# Patient Record
Sex: Female | Born: 1970 | Race: Black or African American | Hispanic: No | Marital: Married | Smoking: Never smoker
Health system: Southern US, Community
[De-identification: ages and names within clinical notes are randomized; demographics above are authoritative.]

## PROBLEM LIST (undated history)

## (undated) DIAGNOSIS — K219 Gastro-esophageal reflux disease without esophagitis: Secondary | ICD-10-CM

## (undated) DIAGNOSIS — J45909 Unspecified asthma, uncomplicated: Secondary | ICD-10-CM

## (undated) DIAGNOSIS — F419 Anxiety disorder, unspecified: Secondary | ICD-10-CM

## (undated) DIAGNOSIS — G4730 Sleep apnea, unspecified: Secondary | ICD-10-CM

## (undated) DIAGNOSIS — E669 Obesity, unspecified: Secondary | ICD-10-CM

## (undated) DIAGNOSIS — E119 Type 2 diabetes mellitus without complications: Secondary | ICD-10-CM

## (undated) DIAGNOSIS — I10 Essential (primary) hypertension: Secondary | ICD-10-CM

## (undated) DIAGNOSIS — M1990 Unspecified osteoarthritis, unspecified site: Secondary | ICD-10-CM

## (undated) HISTORY — PX: CHOLECYSTECTOMY: SHX55

## (undated) HISTORY — DX: Essential (primary) hypertension: I10

## (undated) HISTORY — DX: Anxiety disorder, unspecified: F41.9

## (undated) HISTORY — DX: Unspecified osteoarthritis, unspecified site: M19.90

## (undated) HISTORY — PX: ABDOMINAL HYSTERECTOMY: SHX81

## (undated) HISTORY — DX: Obesity, unspecified: E66.9

## (undated) HISTORY — DX: Sleep apnea, unspecified: G47.30

## (undated) HISTORY — DX: Type 2 diabetes mellitus without complications: E11.9

## (undated) HISTORY — PX: LAPAROSCOPIC GASTRIC BANDING: SHX1100

---

## 2004-04-07 ENCOUNTER — Encounter: Admission: RE | Admit: 2004-04-07 | Discharge: 2004-06-03 | Payer: Self-pay | Admitting: Family Medicine

## 2004-08-20 ENCOUNTER — Inpatient Hospital Stay (HOSPITAL_COMMUNITY): Admission: AD | Admit: 2004-08-20 | Discharge: 2004-08-20 | Payer: Self-pay | Admitting: Obstetrics and Gynecology

## 2004-09-26 ENCOUNTER — Ambulatory Visit (HOSPITAL_COMMUNITY): Admission: RE | Admit: 2004-09-26 | Discharge: 2004-09-26 | Payer: Self-pay | Admitting: Obstetrics & Gynecology

## 2004-11-07 ENCOUNTER — Ambulatory Visit (HOSPITAL_COMMUNITY): Admission: RE | Admit: 2004-11-07 | Discharge: 2004-11-07 | Payer: Self-pay | Admitting: Obstetrics & Gynecology

## 2006-07-23 ENCOUNTER — Emergency Department (HOSPITAL_COMMUNITY): Admission: EM | Admit: 2006-07-23 | Discharge: 2006-07-23 | Payer: Self-pay | Admitting: Emergency Medicine

## 2007-04-11 ENCOUNTER — Ambulatory Visit (HOSPITAL_COMMUNITY): Admission: RE | Admit: 2007-04-11 | Discharge: 2007-04-11 | Payer: Self-pay | Admitting: General Surgery

## 2007-04-21 ENCOUNTER — Encounter: Admission: RE | Admit: 2007-04-21 | Discharge: 2007-04-21 | Payer: Self-pay | Admitting: General Surgery

## 2009-06-17 ENCOUNTER — Ambulatory Visit (HOSPITAL_BASED_OUTPATIENT_CLINIC_OR_DEPARTMENT_OTHER): Admission: RE | Admit: 2009-06-17 | Discharge: 2009-06-17 | Payer: Self-pay | Admitting: Family Medicine

## 2009-06-17 ENCOUNTER — Ambulatory Visit: Payer: Self-pay | Admitting: Diagnostic Radiology

## 2010-06-17 ENCOUNTER — Other Ambulatory Visit (HOSPITAL_BASED_OUTPATIENT_CLINIC_OR_DEPARTMENT_OTHER): Payer: Self-pay | Admitting: Family Medicine

## 2010-06-17 DIAGNOSIS — Z1231 Encounter for screening mammogram for malignant neoplasm of breast: Secondary | ICD-10-CM

## 2010-06-25 ENCOUNTER — Inpatient Hospital Stay (HOSPITAL_BASED_OUTPATIENT_CLINIC_OR_DEPARTMENT_OTHER): Admission: RE | Admit: 2010-06-25 | Payer: Self-pay | Source: Ambulatory Visit

## 2010-06-26 ENCOUNTER — Other Ambulatory Visit (HOSPITAL_BASED_OUTPATIENT_CLINIC_OR_DEPARTMENT_OTHER): Payer: Self-pay | Admitting: Family Medicine

## 2010-06-26 DIAGNOSIS — Z1231 Encounter for screening mammogram for malignant neoplasm of breast: Secondary | ICD-10-CM

## 2010-07-01 ENCOUNTER — Other Ambulatory Visit (HOSPITAL_BASED_OUTPATIENT_CLINIC_OR_DEPARTMENT_OTHER): Payer: Self-pay | Admitting: Family Medicine

## 2010-07-01 ENCOUNTER — Ambulatory Visit (HOSPITAL_BASED_OUTPATIENT_CLINIC_OR_DEPARTMENT_OTHER)
Admission: RE | Admit: 2010-07-01 | Discharge: 2010-07-01 | Disposition: A | Discharge status: Home / Self Care | Payer: BC Managed Care – PPO | Source: Ambulatory Visit | Attending: Family Medicine | Admitting: Family Medicine

## 2010-07-01 ENCOUNTER — Ambulatory Visit (INDEPENDENT_AMBULATORY_CARE_PROVIDER_SITE_OTHER)
Admission: RE | Admit: 2010-07-01 | Discharge: 2010-07-01 | Disposition: A | Discharge status: Home / Self Care | Payer: BC Managed Care – PPO | Source: Ambulatory Visit | Attending: Family Medicine | Admitting: Family Medicine

## 2010-07-01 DIAGNOSIS — Z1231 Encounter for screening mammogram for malignant neoplasm of breast: Secondary | ICD-10-CM

## 2010-08-08 NOTE — Op Note (Signed)
NAMEDARAH, SIMKIN               ACCOUNT NO.:  [id number]   MEDICAL RECORD NO.:  [id number]          PATIENT TYPE:  AMB   LOCATION:  SDC                           FACILITY:  [facility]   PHYSICIAN:  Roseanna Rainbow, M.D.DATE OF BIRTH:  05-29-1970   DATE OF PROCEDURE:  11/07/2004  DATE OF DISCHARGE:                                 OPERATIVE REPORT   PREOPERATIVE DIAGNOSES:  Cervical stenosis, menorrhagia, dysmenorrhea.   POSTOPERATIVE DIAGNOSES:  Cervical stenosis, menorrhagia, dysmenorrhea.   PROCEDURE:  Cervical dilatation and Mirena IUD insertion.   SURGEON:  Antionette Char, M.D.   ANESTHESIA:  Managed anesthesia care, paracervical block.   COMPLICATIONS:  None.   ESTIMATED BLOOD LOSS:  Minimal.   DESCRIPTION OF PROCEDURE:  The patient was taken to the operating room. She  was placed in the dorsal lithotomy position and prepped and draped in the  usual sterile fashion. A Graves speculum was then placed in the patient's  vagina. The anterior lip of the cervix was infiltrated with 2 mL of  lidocaine. A single-tooth tenaculum was applied to this location. 4 mL were  injected at 5 and 7 o'clock to produce a paracervical block. The cervix was  then dilated initially with lacrimal duct dilators and subsequently with  Hegar dilators. Using ultrasound guidance, the uterus was sounded gently to  9 cm. Again using ultrasound guidance, the Mirena IUD was then inserted and  the string trimmed. The tenaculum was removed with minimal bleeding noted.  The patient tolerated the procedures well. The patient was taken to the PACU  awake and in stable condition.      Roseanna Rainbow, M.D.  Electronically Signed     LAJ/MEDQ  D:  11/07/2004  T:  11/07/2004  Job:  [id number]

## 2015-03-24 HISTORY — PX: SUPRACERVICAL ABDOMINAL HYSTERECTOMY: SHX5393

## 2019-12-01 ENCOUNTER — Emergency Department (HOSPITAL_BASED_OUTPATIENT_CLINIC_OR_DEPARTMENT_OTHER): Payer: 59

## 2019-12-01 ENCOUNTER — Encounter: Payer: Self-pay | Admitting: Gastroenterology

## 2019-12-01 ENCOUNTER — Emergency Department (HOSPITAL_BASED_OUTPATIENT_CLINIC_OR_DEPARTMENT_OTHER)
Admission: EM | Admit: 2019-12-01 | Discharge: 2019-12-01 | Disposition: A | Discharge status: Home / Self Care | Payer: 59 | Attending: Emergency Medicine | Admitting: Emergency Medicine

## 2019-12-01 ENCOUNTER — Other Ambulatory Visit: Payer: Self-pay

## 2019-12-01 ENCOUNTER — Encounter (HOSPITAL_BASED_OUTPATIENT_CLINIC_OR_DEPARTMENT_OTHER): Payer: Self-pay | Admitting: Emergency Medicine

## 2019-12-01 ENCOUNTER — Ambulatory Visit: Payer: 59 | Admitting: Gastroenterology

## 2019-12-01 VITALS — BP 114/60 | HR 92 | Ht 64.0 in | Wt 276.6 lb

## 2019-12-01 DIAGNOSIS — K219 Gastro-esophageal reflux disease without esophagitis: Secondary | ICD-10-CM | POA: Diagnosis not present

## 2019-12-01 DIAGNOSIS — Z9884 Bariatric surgery status: Secondary | ICD-10-CM | POA: Diagnosis not present

## 2019-12-01 DIAGNOSIS — J45909 Unspecified asthma, uncomplicated: Secondary | ICD-10-CM | POA: Diagnosis not present

## 2019-12-01 DIAGNOSIS — K2100 Gastro-esophageal reflux disease with esophagitis, without bleeding: Secondary | ICD-10-CM | POA: Insufficient documentation

## 2019-12-01 DIAGNOSIS — Z20822 Contact with and (suspected) exposure to covid-19: Secondary | ICD-10-CM | POA: Insufficient documentation

## 2019-12-01 DIAGNOSIS — Z7951 Long term (current) use of inhaled steroids: Secondary | ICD-10-CM | POA: Diagnosis not present

## 2019-12-01 DIAGNOSIS — Z91010 Allergy to peanuts: Secondary | ICD-10-CM | POA: Diagnosis not present

## 2019-12-01 DIAGNOSIS — J690 Pneumonitis due to inhalation of food and vomit: Secondary | ICD-10-CM

## 2019-12-01 DIAGNOSIS — R1110 Vomiting, unspecified: Secondary | ICD-10-CM | POA: Diagnosis present

## 2019-12-01 HISTORY — DX: Gastro-esophageal reflux disease without esophagitis: K21.9

## 2019-12-01 HISTORY — DX: Unspecified asthma, uncomplicated: J45.909

## 2019-12-01 LAB — SARS CORONAVIRUS 2 BY RT PCR (HOSPITAL ORDER, PERFORMED IN ~~LOC~~ HOSPITAL LAB): SARS Coronavirus 2: NEGATIVE

## 2019-12-01 MED ORDER — FLUCONAZOLE 150 MG PO TABS: ORAL_TABLET | ORAL | 0 refills | Status: DC

## 2019-12-01 MED ORDER — AMOXICILLIN 500 MG PO CAPS
1000.0000 mg | ORAL_CAPSULE | Freq: Once | ORAL | Status: AC
  Administered 2019-12-01: 1000 mg via ORAL
  Filled 2019-12-01: qty 2

## 2019-12-01 MED ORDER — FAMOTIDINE 20 MG PO TABS: 20.0000 mg | ORAL_TABLET | Freq: Two times a day (BID) | ORAL | 2 refills | Status: DC

## 2019-12-01 MED ORDER — FAMOTIDINE 20 MG PO TABS
40.0000 mg | ORAL_TABLET | Freq: Once | ORAL | Status: AC
  Administered 2019-12-01: 40 mg via ORAL
  Filled 2019-12-01: qty 2

## 2019-12-01 MED ORDER — AMOXICILLIN 500 MG PO CAPS: 1000.0000 mg | ORAL_CAPSULE | Freq: Three times a day (TID) | ORAL | 0 refills | Status: DC

## 2019-12-01 MED ORDER — FAMOTIDINE 40 MG PO TABS: 20.0000 mg | ORAL_TABLET | Freq: Two times a day (BID) | ORAL | 0 refills | Status: DC

## 2019-12-01 MED ORDER — OMEPRAZOLE 2 MG/ML ORAL SUSPENSION: 40.0000 mg | Freq: Two times a day (BID) | ORAL | 2 refills | Status: DC

## 2019-12-01 NOTE — ED Provider Notes (Addendum)
[facility] Provider Note: Veronica Dell, MD, FACEP  CSN: [id number] MRN: [id number] ARRIVAL: 12/01/19 at 0414 ROOM: MH11/MH11   CHIEF COMPLAINT  Vomiting and Aspiration   HISTORY OF PRESENT ILLNESS  12/01/19 4:43 AM Veronica [name] is a 49 y.o. female with a longstanding history of acid reflux status post lap banding 7 years ago.  She believes the LAP-BAND is slipped from its ideal position.  Her acid reflux has become severe over the past week and occurs when she lies flat or even sits up on several pillows at night.  She describes her reflux as bilious and acidic and causes her to cough.  She is concerned she may have aspirated and got pneumonia.  She has a GI appointment later today but wanted to be evaluated for pneumonia.  She has only having mild shortness of breath.  She had a subjective fever at home for which she took Tylenol.   Past Medical History:  Diagnosis Date  . Asthma   . GERD (gastroesophageal reflux disease)     Past Surgical History:  Procedure Laterality Date  . ABDOMINAL HYSTERECTOMY    . CHOLECYSTECTOMY    . LAPAROSCOPIC GASTRIC BANDING      No family history on file.  Social History   Tobacco Use  . Smoking status: Never Smoker  . Smokeless tobacco: Never Used  Substance Use Topics  . Alcohol use: Not Currently  . Drug use: Never    Prior to Admission medications   Medication Sig Start Date End Date Taking? Authorizing Provider  fluticasone (FLONASE) 50 MCG/ACT nasal spray Place into both nostrils daily.   Yes [provider]  fluticasone-salmeterol (ADVAIR HFA) 115-21 MCG/ACT inhaler Inhale 2 puffs into the lungs 2 (two) times daily.   Yes [provider]  montelukast (SINGULAIR) 10 MG tablet Take 10 mg by mouth at bedtime.   Yes [provider]  pantoprazole (PROTONIX) 40 MG tablet Take 40 mg by mouth daily.   Yes [provider]  amoxicillin (AMOXIL) 500 MG capsule Take 2 capsules (1,000 mg  total) by mouth 3 (three) times daily. 12/01/19   Aarav Burgett, MD  fluconazole (DIFLUCAN) 150 MG tablet Take 1 tablet as needed for vaginal yeast infection.  May repeat in 3 days if symptoms persist. 12/01/19   Wyman Meschke, MD    Allergies Peanut-containing drug products, Percocet [oxycodone-acetaminophen], and Shellfish allergy   REVIEW OF SYSTEMS  Negative except as noted here or in the History of Present Illness.   PHYSICAL EXAMINATION  Initial Vital Signs Blood pressure (!) 144/98, pulse 100, temperature 99.2 F (37.3 C), temperature source Oral, resp. rate 18, last menstrual period 06/15/2010, SpO2 96 %.   Examination General: Well-developed, well-nourished female in no acute distress; appearance consistent with age of record HENT: normocephalic; atraumatic Eyes: pupils equal, round and reactive to light; extraocular muscles intact Neck: supple Heart: regular rate and rhythm Lungs: clear to auscultation bilaterally Abdomen: soft; nondistended; nontender; bowel sounds present Extremities: No deformity; full range of motion; pulses normal Neurologic: Awake, alert and oriented; motor function intact in all extremities and symmetric; no facial droop Skin: Warm and dry Psychiatric: Normal mood and affect   RESULTS  Summary of this visit's results, reviewed and interpreted by myself:   EKG Interpretation  Date/Time:    Ventricular Rate:    PR Interval:    QRS Duration:   QT Interval:    QTC Calculation:   R Axis:     Text  Interpretation:        Laboratory Studies: Results for orders placed or performed during the hospital encounter of 12/01/19 (from the past 24 hour(s))  SARS Coronavirus 2 by RT PCR (hospital order, performed in [facility] hospital lab) Nasopharyngeal Nasopharyngeal Swab     Status: None   Collection Time: 12/01/19  5:25 AM   Specimen: Nasopharyngeal Swab  Result Value Ref Range   SARS Coronavirus 2 NEGATIVE NEGATIVE   Imaging Studies: DG Chest  2 View  Result Date: 12/01/2019 CLINICAL DATA:  Question aspiration.  Cough. EXAM: CHEST - 2 VIEW COMPARISON:  04/11/2007 FINDINGS: Streaky opacity at the right base correlating with the history. No edema, effusion, or pneumothorax. Normal heart size. There is a lap band in good position. Cholecystectomy clips. IMPRESSION: 1. Right lower lobe infiltrate. 2. Lap band in expected position. Electronically Signed   By: Marnee Spring M.D.   On: 12/01/2019 05:15    ED COURSE and MDM  Nursing notes, initial and subsequent vitals signs, including pulse oximetry, reviewed and interpreted by myself.  Vitals:   12/01/19 0443 12/01/19 0526  BP: (!) 144/98   Pulse: 100   Resp: 18   Temp: 99.2 F (37.3 C)   TempSrc: Oral   SpO2: 96%   Weight:  122.5 kg  Height:  5\' 5"  (1.651 m)   Medications  famotidine (PEPCID) tablet 40 mg (40 mg Oral Given 12/01/19 0504)  amoxicillin (AMOXIL) capsule 1,000 mg (1,000 mg Oral Given 12/01/19 0531)   5:25 AM We will add Pepcid to her pantoprazole regimen as the two may provide more relief than the pantoprazole alone.  We will start her on amoxicillin for her pneumonia.  Doxycycline might be a better choice except it can cause esophageal irritation which would be a contraindication in her case.  As noted above patient has an appointment with a gastroenterologist today.  She declines a prescription for an antiemetic because she does not believe they help her.  PROCEDURES  Procedures   ED DIAGNOSES     ICD-10-CM   1. Gastroesophageal reflux disease with esophagitis without hemorrhage  K21.00   2. Aspiration pneumonia of right lower lobe due to gastric secretions (HCC)  J69.0   3. COVID-19 virus not detected  Z03.818        Buddie Marston, 01/31/20, MD 12/01/19 01/31/20    [id number], MD 12/01/19 [phone]

## 2019-12-01 NOTE — Progress Notes (Addendum)
Referring Provider: No ref. provider found Primary Care Physician:  Patient, No Pcp Per  Reason for Consultation:  Severe reflux  IMPRESSION:  Severe reflux with brash and dysphonia Prior LAP-BAND with patient concerned that the band has slipped Small sliding hiatal hernia with mild gastroesophageal reflux seen on upper GI series 2009   Right lower lobe pneumonia on amoxicillin History of colon polyps     - patient understood she needed annual to every other year surveillance colonoscopy  Challenging situation with limited records.  Will work to obtain recent and prior records.  Unclear if ongoing symptoms are related to her LAP-BAND although this seems most likely. Will maximize medical therapy, proceed with UGI series to evaluate her anatomy, and consider EGD +/- Bravo in the future. I'd like for her to discuss her concerns about the slipped LAP-BAND with a bariatric surgeon.    PLAN: Obtain records from the [place] Switch pantoprazole to omeprazole elixir 40 mg BID Add famotidine 20 mg BID UGI series Reviewed lifestyle modifications for reflux Referral to bariatric surgery to discuss lap band removal Follow-up in the office in 4-8 weeks Referral to [facility]  Please see the "Patient Instructions" section for addition details about the plan.  HPI: Veronica [name] is a 49 y.o. female self-referred for severe reflux, vomiting, and reflux. The history is obtained through the patients. She does not have any records regarding her prior care and I do not have access to an electronic health record. She does not have any doctors locally.  She just moved to [place] 2 months ago after living in the [place] for several years.  She reports a history of anemia, asthma, arthritis in her feet and knees, gallstones, reflux, colon polyps, and obesity.  LAP-BAND 7 years ago in [place]. 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   Prior history of H pylori  diagnosed and treated while in [place] in 2019.   Notes a 6 month history of symptoms evaluated by a gastroenterologist and with her surgeon prior to leaving the [place]. Evaluation at that time included an UGI series and endoscopy. They discussed conversion to have the lap band removed with a gastric sleeve. They told her it would take $20,000. She was told that the esophagus was dilated because of the lap band.   Severe brash with associated dysphonia. No neck pain or sore throat. Exacerbated by lying flat. Has been sleeping on 6 pillows to try to minimize her symptoms.   When she eats she feels like the foods sits in her esophagus for hours before it passes.  Tried an alkaline diet, soft foods without any change in her symptoms.  She has lost 40 pounds because she can't eat.  She is following a very restrictive diet. She is on pantoprazole - which previously controled her symptoms.   The symptoms are so severe she doesn't feel like she is living. Seen in the ED this morning for these same symptoms. She is concerned that her lap band slipped. A chest x-ray showed a right lower lobe infiltrate and she was started on amoxicillin.  The LAP-BAND was in the expected position. The ED MD added Pepcid to her pantoprazole.   An upper GI series in 2009 obtained prior to her bariatric surgery showed a sliding hiatal hernia, a small amount of reflux, and otherwise normal-appearing motility.  She had a colonoscopy in 2019 in [place]. She had some polyps removed at that time. She  wasn't sure if she was supposed to have a colonoscopy every year or every other year.   She is very concerned about her risk for cancer with ongoing severe reflux.   Father with esophageal cancer at age 74. No known family history of colon cancer or polyps. No family history of uterine/endometrial cancer, pancreatic cancer or gastric/stomach cancer.   Past Medical History:  Diagnosis Date  . Asthma   . GERD (gastroesophageal reflux disease)      Past Surgical History:  Procedure Laterality Date  . ABDOMINAL HYSTERECTOMY    . CHOLECYSTECTOMY    . LAPAROSCOPIC GASTRIC BANDING      Current Outpatient Medications  Medication Sig Dispense Refill  . amoxicillin (AMOXIL) 500 MG capsule Take 2 capsules (1,000 mg total) by mouth 3 (three) times daily. 40 capsule 0  . fluconazole (DIFLUCAN) 150 MG tablet Take 1 tablet as needed for vaginal yeast infection.  May repeat in 3 days if symptoms persist. 2 tablet 0  . fluticasone (FLONASE) 50 MCG/ACT nasal spray Place into both nostrils daily.    . fluticasone-salmeterol (ADVAIR HFA) 115-21 MCG/ACT inhaler Inhale 2 puffs into the lungs 2 (two) times daily.    . montelukast (SINGULAIR) 10 MG tablet Take 10 mg by mouth at bedtime.    . famotidine (PEPCID) 20 MG tablet Take 1 tablet (20 mg total) by mouth 2 (two) times daily. 60 tablet 2  . omeprazole (PRILOSEC) 40 MG capsule Open 1 capsule and sprinkle in applesauce twice daily. 60 capsule 3   No current facility-administered medications for this visit.    Allergies as of 12/01/2019 - Review Complete 12/01/2019  Allergen Reaction Noted  . Peanut-containing drug products  12/01/2019  . Percocet [oxycodone-acetaminophen]  12/01/2019  . Shellfish allergy  12/01/2019    History reviewed. No pertinent family history.  Social History   Socioeconomic History  . Marital status: Married    Spouse name: Not on file  . Number of children: Not on file  . Years of education: Not on file  . Highest education level: Not on file  Occupational History  . Not on file  Tobacco Use  . Smoking status: Never Smoker  . Smokeless tobacco: Never Used  Substance and Sexual Activity  . Alcohol use: Not Currently  . Drug use: Never  . Sexual activity: Not on file  Other Topics Concern  . Not on file  Social History Narrative  . Not on file   Social Determinants of Health   Financial Resource Strain:   . Difficulty of Paying Living Expenses:  Not on file  Food Insecurity:   . Worried About Programme researcher, broadcasting/film/video in the Last Year: Not on file  . Ran Out of Food in the Last Year: Not on file  Transportation Needs:   . Lack of Transportation (Medical): Not on file  . Lack of Transportation (Non-Medical): Not on file  Physical Activity:   . Days of Exercise per Week: Not on file  . Minutes of Exercise per Session: Not on file  Stress:   . Feeling of Stress : Not on file  Social Connections:   . Frequency of Communication with Friends and Family: Not on file  . Frequency of Social Gatherings with Friends and Family: Not on file  . Attends Religious Services: Not on file  . Active Member of Clubs or Organizations: Not on file  . Attends Banker Meetings: Not on file  . Marital Status: Not on file  Intimate Partner Violence:   . Fear of Current or Ex-Partner: Not on file  . Emotionally Abused: Not on file  . Physically Abused: Not on file  . Sexually Abused: Not on file    Review of Systems: 12 system ROS is negative except as noted above with the additions of allergies, arthritis, cough, muscle pain, shortness of breath, insomnia, and dysphonia. Marland Kitchen   Physical Exam: General:   Alert,  well-nourished, pleasant and cooperative in NAD Head:  Normocephalic and atraumatic. Eyes:  Sclera clear, no icterus.   Conjunctiva pink. Ears:  Normal auditory acuity. Nose:  No deformity, discharge,  or lesions. Mouth:  No deformity or lesions.   Neck:  Supple; no masses or thyromegaly. Lungs:  Clear throughout to auscultation.   No wheezes. Heart:  Regular rate and rhythm; no murmurs. Abdomen:  Soft, central obesity, nontender, nondistended, normal bowel sounds, no rebound or guarding. No hepatosplenomegaly.   Rectal:  Deferred  Msk:  Symmetrical. No boney deformities LAD: No inguinal or umbilical LAD Extremities:  No clubbing or edema. Neurologic:  Alert and  oriented x4;  grossly nonfocal Skin:  Intact without significant  lesions or rashes. Psych:  Alert and cooperative. Normal mood and affect.     Studies/Results: No results found.    Delecia Vastine L. Orvan Falconer, MD, MPH 12/12/2019, 9:06 PM

## 2019-12-01 NOTE — Patient Instructions (Addendum)
You have been scheduled for an Upper GI Series at [facility] . Your appointment is on 12/07/19 at 9:00am. Please arrive 15 minutes prior to your test for registration. Make sure not to eat or drink anything after midnight on the night before your test. If you need to reschedule, please call radiology at [phone]. ________________________________________________________________ An upper GI series uses x rays to help diagnose problems of the upper GI tract, which includes the esophagus, stomach, and duodenum. The duodenum is the first part of the small intestine. An upper GI series is conducted by a radiology technologist or a radiologist--a doctor who specializes in x-ray imaging--at a hospital or outpatient center. While sitting or standing in front of an x-ray machine, the patient drinks barium liquid, which is often white and has a chalky consistency and taste. The barium liquid coats the lining of the upper GI tract and makes signs of disease show up more clearly on x rays. X-ray video, called fluoroscopy, is used to view the barium liquid moving through the esophagus, stomach, and duodenum. Additional x rays and fluoroscopy are performed while the patient lies on an x-ray table. To fully coat the upper GI tract with barium liquid, the technologist or radiologist may press on the abdomen or ask the patient to change position. Patients hold still in various positions, allowing the technologist or radiologist to take x rays of the upper GI tract at different angles. If a technologist conducts the upper GI series, a radiologist will later examine the images to look for problems.  This test typically takes about 1 hour to complete.  Stop Pantopazole.   Start Omerpazole Designer, jewellery).   We have sent the following medications to your pharmacy for you to pick up at your convenience: Omeprazole, Pepcid   A referral has been made to [facility] and [facility]. Please see their contact  information on your after visit summary. If you have not heard from their office in 1 week , please contact office.   Try to obtain your records from [place].   Thank you for choosing me and [facility].  Dr.Kimberly Beavers

## 2019-12-01 NOTE — ED Triage Notes (Signed)
Patient presents with complaints of severe acid reflux with vomiting; states she is waking up at night vomiting bile that is also coming out of her nose; states she feels like it has gotten into her lungs now and is having a cough now states sx onset 1 week ago; states has GI appointment today; states vomiting is only when lying down. States had lap band surgery approx 7 years ago.

## 2019-12-04 ENCOUNTER — Ambulatory Visit (HOSPITAL_COMMUNITY)
Admission: RE | Admit: 2019-12-04 | Discharge: 2019-12-04 | Disposition: A | Discharge status: Home / Self Care | Payer: 59 | Source: Ambulatory Visit | Attending: Gastroenterology | Admitting: Gastroenterology

## 2019-12-04 ENCOUNTER — Other Ambulatory Visit: Payer: Self-pay

## 2019-12-04 ENCOUNTER — Telehealth: Payer: Self-pay | Admitting: Gastroenterology

## 2019-12-04 DIAGNOSIS — K219 Gastro-esophageal reflux disease without esophagitis: Secondary | ICD-10-CM | POA: Insufficient documentation

## 2019-12-04 DIAGNOSIS — Z9884 Bariatric surgery status: Secondary | ICD-10-CM | POA: Insufficient documentation

## 2019-12-04 MED ORDER — OMEPRAZOLE 40 MG PO CPDR: DELAYED_RELEASE_CAPSULE | ORAL | 3 refills | Status: DC

## 2019-12-04 NOTE — Telephone Encounter (Signed)
Pt has been informed that rx for Omeprazole capsules have been sent to pharmacy with instructions for pt to open capsule and sprinkle in applesauce twice daily. Pt voiced understanding.

## 2019-12-07 ENCOUNTER — Ambulatory Visit (HOSPITAL_COMMUNITY): Payer: 59

## 2019-12-12 ENCOUNTER — Telehealth: Payer: Self-pay

## 2019-12-12 NOTE — Telephone Encounter (Signed)
Pepcid 20mg  has been approved through 12-10-2020. Approval note was faxed to pharmacy.

## 2019-12-23 ENCOUNTER — Other Ambulatory Visit: Payer: Self-pay | Admitting: Gastroenterology

## 2020-01-30 ENCOUNTER — Telehealth: Payer: Self-pay

## 2020-01-30 NOTE — Telephone Encounter (Signed)
In preparation for upcoming OV 02/06/20 with Dr. Orvan Falconer, I am unable to locate documentation pertaining to [facility] referral for possible lap band removal NOR records from [place]. Called pt to inquire further. LVM requesting returned call. Also called [facility] and spoke with traige nurse who confirmed pt was seen by Dr. Daphine Deutscher. My call was forwarded to Medical Records Bonita Quin). LVM requesting office notes be faxed to our office ATTENTION: Jakhai Fant.

## 2020-01-31 NOTE — Telephone Encounter (Signed)
Office notes from [facility] has been received and will provide to Dr. Orvan Falconer for her review. Patient scheduled for next OV 02/06/20 w/ Dr. Orvan Falconer.

## 2020-01-31 NOTE — Telephone Encounter (Signed)
Follow up re: [place] records:  Records cannot be located since records and services were provided outside the country.

## 2020-01-31 NOTE — Telephone Encounter (Signed)
Called [facility] to inquire again about office notes. Spoke with Bonita Quin. States she is faxing now. Will continue to await records.

## 2020-02-06 ENCOUNTER — Ambulatory Visit: Payer: 59 | Admitting: Gastroenterology

## 2020-02-12 ENCOUNTER — Other Ambulatory Visit: Payer: Self-pay

## 2020-02-13 ENCOUNTER — Ambulatory Visit: Payer: 59 | Admitting: Nurse Practitioner

## 2020-02-13 DIAGNOSIS — Z0289 Encounter for other administrative examinations: Secondary | ICD-10-CM

## 2020-02-25 ENCOUNTER — Other Ambulatory Visit: Payer: Self-pay

## 2020-02-25 ENCOUNTER — Encounter (HOSPITAL_COMMUNITY): Payer: Self-pay

## 2020-02-25 ENCOUNTER — Ambulatory Visit (HOSPITAL_COMMUNITY)
Admission: EM | Admit: 2020-02-25 | Discharge: 2020-02-25 | Disposition: A | Discharge status: Home / Self Care | Payer: 59 | Attending: Emergency Medicine | Admitting: Emergency Medicine

## 2020-02-25 DIAGNOSIS — R3589 Other polyuria: Secondary | ICD-10-CM | POA: Diagnosis not present

## 2020-02-25 DIAGNOSIS — R300 Dysuria: Secondary | ICD-10-CM | POA: Diagnosis not present

## 2020-02-25 LAB — POCT URINALYSIS DIPSTICK, ED / UC
Bilirubin Urine: NEGATIVE
Glucose, UA: NEGATIVE mg/dL
Ketones, ur: NEGATIVE mg/dL
Leukocytes,Ua: NEGATIVE
Nitrite: NEGATIVE
Protein, ur: NEGATIVE mg/dL
Specific Gravity, Urine: 1.025 (ref 1.005–1.030)
Urobilinogen, UA: 0.2 mg/dL (ref 0.0–1.0)
pH: 6 (ref 5.0–8.0)

## 2020-02-25 LAB — CBG MONITORING, ED: Glucose-Capillary: 126 mg/dL — ABNORMAL HIGH (ref 70–99)

## 2020-02-25 MED ORDER — MONTELUKAST SODIUM 10 MG PO TABS: 10.0000 mg | ORAL_TABLET | Freq: Every day | ORAL | 0 refills | Status: DC

## 2020-02-25 MED ORDER — DESLORATADINE 5 MG PO TABS: 5.0000 mg | ORAL_TABLET | Freq: Every day | ORAL | 0 refills | Status: DC

## 2020-02-25 NOTE — Discharge Instructions (Signed)
Your urine does not indicate infection today.  Your blood sugar, which is non-fasting, if borderline.  Please increase your water intake.  Monitor your sugar and carbohydrate intake. Increase fiber and protein intake.  Please follow up with your primary care provider as scheduled as you will likely need additional lab testing.

## 2020-02-25 NOTE — ED Triage Notes (Signed)
Pt c/o urinary frequency, urgency with intermittent burning with urination for approx 1 week.  Denies hematuria, abdominal/flank/back pain, fever, n/v/d.  Also requests refill of singulair.  Took tylenol arthritis this morning at 0900

## 2020-02-25 NOTE — ED Provider Notes (Signed)
[facility]    CSN: [id number] Arrival date & time: 02/25/20  1520      History   Chief Complaint Chief Complaint  Patient presents with  . Dysuria    HPI Veronica [name] is a 49 y.o. female.   Veronica [name] presents with complaints of urinary frequency for the past 2 weeks. Has been waking up at night even to go to the bathroom. No blood to urine. Only occasional burning with urination but this is rare. No pelvic or back pain. No blood to urine. Moved here from the [place] recently so has appointment in a little over a month to establish locally with a PCP. In need of refills for clarinex as well as singulair- history of asthma and allergies.     ROS per HPI, negative if not otherwise mentioned.      Past Medical History:  Diagnosis Date  . Asthma   . GERD (gastroesophageal reflux disease)     There are no problems to display for this patient.   Past Surgical History:  Procedure Laterality Date  . ABDOMINAL HYSTERECTOMY    . CHOLECYSTECTOMY    . LAPAROSCOPIC GASTRIC BANDING      OB History   No obstetric history on file.      Home Medications    Prior to Admission medications   Medication Sig Start Date End Date Taking? Authorizing Provider  famotidine (PEPCID) 20 MG tablet TAKE 1 TABLET BY MOUTH TWICE A DAY 12/25/19  Yes Tressia Danas, MD  fluticasone (FLONASE) 50 MCG/ACT nasal spray Place into both nostrils daily.   Yes [provider]  fluticasone-salmeterol (ADVAIR HFA) 115-21 MCG/ACT inhaler Inhale 2 puffs into the lungs 2 (two) times daily.   Yes [provider]  amoxicillin (AMOXIL) 500 MG capsule Take 2 capsules (1,000 mg total) by mouth 3 (three) times daily. 12/01/19   Molpus, John, MD  desloratadine (CLARINEX) 5 MG tablet Take 1 tablet (5 mg total) by mouth daily. 02/25/20   Georgetta Haber, NP  fluconazole (DIFLUCAN) 150 MG tablet Take 1 tablet as needed for vaginal yeast infection.  May repeat in 3 days if symptoms  persist. 12/01/19   Molpus, Jonny Ruiz, MD  montelukast (SINGULAIR) 10 MG tablet Take 1 tablet (10 mg total) by mouth at bedtime. 02/25/20   Georgetta Haber, NP  omeprazole (PRILOSEC) 40 MG capsule Open 1 capsule and sprinkle in applesauce twice daily. 12/04/19   Tressia Danas, MD    Family History History reviewed. No pertinent family history.  Social History Social History   Tobacco Use  . Smoking status: Never Smoker  . Smokeless tobacco: Never Used  Substance Use Topics  . Alcohol use: Not Currently  . Drug use: Never     Allergies   Peanut-containing drug products, Percocet [oxycodone-acetaminophen], and Shellfish allergy   Review of Systems Review of Systems   Physical Exam Triage Vital Signs ED Triage Vitals  Enc Vitals Group     BP 02/25/20 1631 (!) 154/96     Pulse Rate 02/25/20 1631 85     Resp 02/25/20 1631 20     Temp 02/25/20 1631 97.7 F (36.5 C)     Temp Source 02/25/20 1631 Oral     SpO2 02/25/20 1631 100 %     Weight --      Height --      Head Circumference --      Peak Flow --      Pain Score 02/25/20  1629 0     Pain Loc --      Pain Edu? --      Excl. in GC? --    No data found.  Updated Vital Signs BP (!) 154/96 (BP Location: Right Wrist)   Pulse 85   Temp 97.7 F (36.5 C) (Oral)   Resp 20   LMP 06/15/2010   SpO2 100%   Visual Acuity Right Eye Distance:   Left Eye Distance:   Bilateral Distance:    Right Eye Near:   Left Eye Near:    Bilateral Near:     Physical Exam Constitutional:      General: She is not in acute distress.    Appearance: She is well-developed.  Cardiovascular:     Rate and Rhythm: Normal rate.  Pulmonary:     Effort: Pulmonary effort is normal.  Abdominal:     Tenderness: There is no abdominal tenderness. There is no right CVA tenderness or left CVA tenderness.  Skin:    General: Skin is warm and dry.  Neurological:     Mental Status: She is alert and oriented to person, place, and time.      UC  Treatments / Results  Labs (all labs ordered are listed, but only abnormal results are displayed) Labs Reviewed  POCT URINALYSIS DIPSTICK, ED / UC - Abnormal; Notable for the following components:      Result Value   Hgb urine dipstick SMALL (*)    All other components within normal limits  CBG MONITORING, ED - Abnormal; Notable for the following components:   Glucose-Capillary 126 (*)    All other components within normal limits  URINE CULTURE    EKG   Radiology No results found.  Procedures Procedures (including critical care time)  Medications Ordered in UC Medications - No data to display  Initial Impression / Assessment and Plan / UC Course  I have reviewed the triage vital signs and the nursing notes.  Pertinent labs & imaging results that were available during my care of the patient were reviewed by me and considered in my medical decision making (see chart for details).     Urine without acute findings here today. Family histoyr of DM with blood sugar today of 126, non fasting. No glucose to urine dip. Discussed this at length with patient, she has a visit with PCP in one month and will follow up for hgb a1c. Diet and exercise encouraged as well in the meantime. Plenty of water encouraged. Return precautions provided. Patient verbalized understanding and agreeable to plan.  Ambulatory out of clinic without difficulty.    Patient had been getting deloratadine while living in [place], attempted to prescribe this as refill but will likely need to take an [place] antihistamine such as claritin or zyrtec.  Final Clinical Impressions(s) / UC Diagnoses   Final diagnoses:  Polyuria     Discharge Instructions     Your urine does not indicate infection today.  Your blood sugar, which is non-fasting, if borderline.  Please increase your water intake.  Monitor your sugar and carbohydrate intake. Increase fiber and protein intake.  Please follow up with your primary care  provider as scheduled as you will likely need additional lab testing.     ED Prescriptions    Medication Sig Dispense Auth. Provider   desloratadine (CLARINEX) 5 MG tablet Take 1 tablet (5 mg total) by mouth daily. 60 tablet Dillie Burandt, Dorene Grebe B, NP   montelukast (SINGULAIR) 10 MG tablet Take  1 tablet (10 mg total) by mouth at bedtime. 60 tablet Georgetta Haber, NP     PDMP not reviewed this encounter.   Georgetta Haber, NP 02/25/20 [phone]

## 2020-02-27 ENCOUNTER — Telehealth (HOSPITAL_COMMUNITY): Payer: Self-pay | Admitting: Emergency Medicine

## 2020-02-27 LAB — URINE CULTURE: Culture: 20000 — AB

## 2020-02-27 MED ORDER — FLUCONAZOLE 150 MG PO TABS: 150.0000 mg | ORAL_TABLET | Freq: Once | ORAL | 0 refills | Status: AC

## 2020-02-27 MED ORDER — NITROFURANTOIN MONOHYD MACRO 100 MG PO CAPS: 100.0000 mg | ORAL_CAPSULE | Freq: Two times a day (BID) | ORAL | 0 refills | Status: DC

## 2020-03-25 ENCOUNTER — Encounter: Payer: Self-pay | Admitting: Nurse Practitioner

## 2020-03-25 ENCOUNTER — Telehealth: Payer: Self-pay | Admitting: Nurse Practitioner

## 2020-03-25 NOTE — Telephone Encounter (Signed)
Pt was no show for appt 02/13/2020 new pt. 1st occurrence. Fee waived. Letter mailed.

## 2020-03-26 ENCOUNTER — Ambulatory Visit: Payer: 59 | Admitting: Gastroenterology

## 2020-04-03 ENCOUNTER — Other Ambulatory Visit: Payer: Self-pay | Admitting: Gastroenterology

## 2020-04-05 ENCOUNTER — Other Ambulatory Visit: Payer: Self-pay

## 2020-04-09 ENCOUNTER — Telehealth: Payer: Self-pay | Admitting: Nurse Practitioner

## 2020-04-09 ENCOUNTER — Encounter: Payer: Self-pay | Admitting: Nurse Practitioner

## 2020-04-09 ENCOUNTER — Ambulatory Visit: Payer: 59 | Admitting: Nurse Practitioner

## 2020-04-09 ENCOUNTER — Other Ambulatory Visit: Payer: Self-pay

## 2020-04-09 VITALS — BP 118/84 | HR 84 | Temp 97.3°F | Ht 64.0 in | Wt 290.2 lb

## 2020-04-09 DIAGNOSIS — F5104 Psychophysiologic insomnia: Secondary | ICD-10-CM

## 2020-04-09 DIAGNOSIS — J4520 Mild intermittent asthma, uncomplicated: Secondary | ICD-10-CM

## 2020-04-09 DIAGNOSIS — J309 Allergic rhinitis, unspecified: Secondary | ICD-10-CM

## 2020-04-09 DIAGNOSIS — Z90710 Acquired absence of both cervix and uterus: Secondary | ICD-10-CM

## 2020-04-09 DIAGNOSIS — G4700 Insomnia, unspecified: Secondary | ICD-10-CM | POA: Insufficient documentation

## 2020-04-09 DIAGNOSIS — R413 Other amnesia: Secondary | ICD-10-CM

## 2020-04-09 DIAGNOSIS — F411 Generalized anxiety disorder: Secondary | ICD-10-CM

## 2020-04-09 DIAGNOSIS — E559 Vitamin D deficiency, unspecified: Secondary | ICD-10-CM

## 2020-04-09 DIAGNOSIS — J45909 Unspecified asthma, uncomplicated: Secondary | ICD-10-CM | POA: Insufficient documentation

## 2020-04-09 DIAGNOSIS — Z90711 Acquired absence of uterus with remaining cervical stump: Secondary | ICD-10-CM | POA: Insufficient documentation

## 2020-04-09 DIAGNOSIS — Z1231 Encounter for screening mammogram for malignant neoplasm of breast: Secondary | ICD-10-CM

## 2020-04-09 MED ORDER — MOMETASONE FUROATE 50 MCG/ACT NA SUSP: 2.0000 | Freq: Every day | NASAL | 12 refills | Status: DC

## 2020-04-09 MED ORDER — MONTELUKAST SODIUM 10 MG PO TABS: 10.0000 mg | ORAL_TABLET | Freq: Every day | ORAL | 3 refills | Status: DC

## 2020-04-09 MED ORDER — DESLORATADINE 5 MG PO TABS: 5.0000 mg | ORAL_TABLET | Freq: Every day | ORAL | 3 refills | Status: DC

## 2020-04-09 NOTE — Telephone Encounter (Signed)
With high score on PHQ form, ask if she is interested in use of anxiety medication-cymbalta and/or referral to psychologist?

## 2020-04-09 NOTE — Patient Instructions (Signed)
Thank you for choosing [facility].  Go to lab for blood draw.  You will be contacted to schedule an appt for mammogram and with sleep clinic.  Memory Compensation Strategies  1. Use "WARM" strategy.  W= write it down  A= associate it  R= repeat it  M= make a mental note  2.   You can keep a Glass blower/designer.  Use a 3-ring notebook with sections for the following: calendar, important names and phone numbers,  medications, doctors' names/phone numbers, lists/reminders, and a section to journal what you did  each day.   3.    Use a calendar to write appointments down.  4.    Write yourself a schedule for the day.  This can be placed on the calendar or in a separate section of the Memory Notebook.  Keeping a  regular schedule can help memory.  5.    Use medication organizer with sections for each day or morning/evening pills.  You may need help loading it  6.    Keep a basket, or pegboard by the door.  Place items that you need to take out with you in the basket or on the pegboard.  You may also want to  include a message board for reminders.  7.    Use sticky notes.  Place sticky notes with reminders in a place where the task is performed.  For example: " turn off the  stove" placed by the stove, "lock the door" placed on the door at eye level, " take your medications" on  the bathroom mirror or by the place where you normally take your medications.  8.    Use alarms/timers.  Use while cooking to remind yourself to check on food or as a reminder to take your medicine, or as a  reminder to make a call, or as a reminder to perform another task, etc.

## 2020-04-09 NOTE — Progress Notes (Signed)
Subjective:  Patient ID: Veronica [name], female    DOB: 07/28/1970  Age: 50 y.o. MRN: [id number]  CC: Establish Care (New patient/Asthma medication refills needed. )  Asthma She complains of cough, shortness of breath and wheezing. There is no chest tightness, difficulty breathing, frequent throat clearing, hemoptysis, hoarse voice or sputum production. This is a chronic problem. The current episode started more than 1 year ago. The problem occurs intermittently. The problem has been unchanged. The cough is non-productive. Pertinent negatives include no appetite change, chest pain, dyspnea on exertion, ear congestion, ear pain, fever, headaches, heartburn, malaise/fatigue, myalgias, nasal congestion, orthopnea, PND, postnasal drip, rhinorrhea, sneezing, sore throat, sweats, trouble swallowing or weight loss. Her symptoms are aggravated by exposure to smoke, exposure to fumes, pollen and change in weather. Her symptoms are alleviated by beta-agonist, steroid inhaler and leukotriene antagonist. She reports complete improvement on treatment. Her past medical history is significant for asthma.  Insomnia Primary symptoms: fragmented sleep, sleep disturbance, difficulty falling asleep, somnolence, frequent awakening, premature morning awakening, no malaise/fatigue, no napping.   The current episode started more than one year. The onset quality is gradual. The problem occurs nightly. The problem has been gradually worsening since onset. The symptoms are aggravated by bed partner and work stress. How many beverages per day that contain caffeine: 0 - 1.  Types of beverages you drink: tea. Nothing relieves the symptoms. Past treatments include medication (melatonin and benadryl). The treatment provided no relief. Typical bedtime:  8-10 P.M..  How long after going to bed to you fall asleep: over an hour.   PMH includes: no hypertension, no depression, no family stress or anxiety, no restless leg syndrome, work  related stressors, no chronic pain, no apnea.  relocated from [place] 85months ago. Denies any witnessed apneic episodes by husband or choking at bedtime.has difficulty with word finding during conversations with students, colleagues and family members. Also experiences easy distraction and getting lost in conversations. Ongoing for 43yrs. Gradually worsening.  Depression screen PHQ 2/9 04/09/2020  Decreased Interest 1  Down, Depressed, Hopeless 2  PHQ - 2 Score 3  Altered sleeping 3  Tired, decreased energy 3  Change in appetite 0  Feeling bad or failure about yourself  2  Trouble concentrating 2  Moving slowly or fidgety/restless 0  Suicidal thoughts 0  PHQ-9 Score 13  Difficult doing work/chores Somewhat difficult   GAD 7 : Generalized Anxiety Score 04/09/2020  Nervous, Anxious, on Edge 1  Control/stop worrying 1  Worry too much - different things 1  Trouble relaxing 3  Restless 2  Easily annoyed or irritable 2  Afraid - awful might happen 1  Total GAD 7 Score 11  Anxiety Difficulty Somewhat difficult   Reviewed past Medical, Social and Family history today.  Outpatient Medications Prior to Visit  Medication Sig Dispense Refill  . famotidine (PEPCID) 20 MG tablet TAKE 1 TABLET BY MOUTH TWICE A DAY 60 tablet 2  . fluticasone-salmeterol (ADVAIR HFA) 115-21 MCG/ACT inhaler Inhale 2 puffs into the lungs 2 (two) times daily.    Marland Kitchen omeprazole (PRILOSEC) 40 MG capsule OPEN 1 CAPSULE AND SPRINKLE IN APPLESAUCE TWICE DAILY. 60 capsule 3  . amoxicillin (AMOXIL) 500 MG capsule Take 2 capsules (1,000 mg total) by mouth 3 (three) times daily. (Patient not taking: Reported on 04/09/2020) 40 capsule 0  . desloratadine (CLARINEX) 5 MG tablet Take 1 tablet (5 mg total) by mouth daily. 60 tablet 0  . fluticasone (FLONASE) 50 MCG/ACT nasal spray  Place into both nostrils daily.    . montelukast (SINGULAIR) 10 MG tablet Take 1 tablet (10 mg total) by mouth at bedtime. 60 tablet 0  . nitrofurantoin,  macrocrystal-monohydrate, (MACROBID) 100 MG capsule Take 1 capsule (100 mg total) by mouth 2 (two) times daily. 10 capsule 0   No facility-administered medications prior to visit.    ROS See HPI  Objective:  BP 118/84 (BP Location: Left Arm, Patient Position: Sitting, Cuff Size: Large)   Pulse 84   Temp (!) 97.3 F (36.3 C) (Temporal)   Ht 5\' 4"  (1.626 m)   Wt 290 lb 3.2 oz (131.6 kg)   LMP 06/15/2010   SpO2 97%   BMI 49.81 kg/m   Physical Exam Vitals reviewed.  Constitutional:      Appearance: She is obese.  Cardiovascular:     Rate and Rhythm: Normal rate and regular rhythm.     Pulses: Normal pulses.     Heart sounds: Normal heart sounds.  Pulmonary:     Effort: Pulmonary effort is normal.     Breath sounds: Normal breath sounds.  Neurological:     Mental Status: She is alert and oriented to person, place, and time.  Psychiatric:        Behavior: Behavior normal.        Thought Content: Thought content normal.     Assessment & Plan:  This visit occurred during the SARS-CoV-2 public health emergency.  Safety protocols were in place, including screening questions prior to the visit, additional usage of staff PPE, and extensive cleaning of exam room while observing appropriate contact time as indicated for disinfecting solutions.   Veronica [name] was seen today for establish care.  Diagnoses and all orders for this visit:  Mild intermittent asthma without complication -     montelukast (SINGULAIR) 10 MG tablet; Take 1 tablet (10 mg total) by mouth at bedtime. -     desloratadine (CLARINEX) 5 MG tablet; Take 1 tablet (5 mg total) by mouth daily.  S/P hysterectomy  Chronic insomnia -     Comprehensive metabolic panel -     TSH -     CBC -     Ambulatory referral to Sleep Studies  Memory difficulty -     Comprehensive metabolic panel -     TSH -     DULoxetine (CYMBALTA) 20 MG capsule; Take 1 capsule (20 mg total) by mouth daily.  Vitamin D insufficiency -      Vitamin D 1,25 dihydroxy  Encounter for screening mammogram for malignant neoplasm of breast -     MM DIGITAL SCREENING BILATERAL; Future  Allergic rhinitis, unspecified seasonality, unspecified trigger -     mometasone (NASONEX) 50 MCG/ACT nasal spray; Place 2 sprays into the nose daily.  GAD (generalized anxiety disorder) -     DULoxetine (CYMBALTA) 20 MG capsule; Take 1 capsule (20 mg total) by mouth daily. -     Ambulatory referral to Psychology    Problem List Items Addressed This Visit      Respiratory   Asthma - Primary   Relevant Medications   montelukast (SINGULAIR) 10 MG tablet   desloratadine (CLARINEX) 5 MG tablet     Other   GAD (generalized anxiety disorder)   Relevant Medications   DULoxetine (CYMBALTA) 20 MG capsule   Other Relevant Orders   Ambulatory referral to Psychology   Insomnia   Memory difficulty   Relevant Medications   DULoxetine (CYMBALTA) 20 MG capsule   Other  Relevant Orders   Comprehensive metabolic panel   TSH   S/P hysterectomy    Other Visit Diagnoses    Vitamin D insufficiency       Relevant Orders   Vitamin D 1,25 dihydroxy   Encounter for screening mammogram for malignant neoplasm of breast       Relevant Orders   MM DIGITAL SCREENING BILATERAL   Allergic rhinitis, unspecified seasonality, unspecified trigger       Relevant Medications   mometasone (NASONEX) 50 MCG/ACT nasal spray      Follow-up: Return in about 3 months (around 07/08/2020) for memory difficulty and insomnia.  Alysia Penna, NP

## 2020-04-10 DIAGNOSIS — R413 Other amnesia: Secondary | ICD-10-CM | POA: Insufficient documentation

## 2020-04-10 DIAGNOSIS — F411 Generalized anxiety disorder: Secondary | ICD-10-CM | POA: Insufficient documentation

## 2020-04-10 LAB — CBC
HCT: 39.3 % (ref 36.0–46.0)
Hemoglobin: 12.7 g/dL (ref 12.0–15.0)
MCHC: 32.3 g/dL (ref 30.0–36.0)
MCV: 79.5 fl (ref 78.0–100.0)
Platelets: 260 10*3/uL (ref 150.0–400.0)
RBC: 4.95 Mil/uL (ref 3.87–5.11)
RDW: 17 % — ABNORMAL HIGH (ref 11.5–15.5)
WBC: 5.4 10*3/uL (ref 4.0–10.5)

## 2020-04-10 LAB — COMPREHENSIVE METABOLIC PANEL
ALT: 16 U/L (ref 0–35)
AST: 19 U/L (ref 0–37)
Albumin: 4 g/dL (ref 3.5–5.2)
Alkaline Phosphatase: 98 U/L (ref 39–117)
BUN: 15 mg/dL (ref 6–23)
CO2: 30 mEq/L (ref 19–32)
Calcium: 9.4 mg/dL (ref 8.4–10.5)
Chloride: 104 mEq/L (ref 96–112)
Creatinine, Ser: 1.07 mg/dL (ref 0.40–1.20)
GFR: 59.94 mL/min — ABNORMAL LOW (ref >60.00)
Glucose, Bld: 88 mg/dL (ref 70–99)
Potassium: 3.6 mEq/L (ref 3.5–5.1)
Sodium: 141 mEq/L (ref 135–145)
Total Bilirubin: 0.2 mg/dL (ref 0.2–1.2)
Total Protein: 7.2 g/dL (ref 6.0–8.3)

## 2020-04-10 LAB — TSH: TSH: 1.99 u[IU]/mL (ref 0.35–4.50)

## 2020-04-10 MED ORDER — DULOXETINE HCL 20 MG PO CPEP: 20.0000 mg | ORAL_CAPSULE | Freq: Every day | ORAL | 5 refills | Status: DC

## 2020-04-10 NOTE — Telephone Encounter (Signed)
Pt would like referral to psychologist and states she will try the medication for 1 month if possible.

## 2020-04-10 NOTE — Telephone Encounter (Signed)
Cymbalta sent and referral to psychology entered. F/up in 60month

## 2020-04-12 LAB — VITAMIN D 1,25 DIHYDROXY
Vitamin D 1, 25 (OH)2 Total: 53 pg/mL (ref 18–72)
Vitamin D2 1, 25 (OH)2: <8 pg/mL
Vitamin D3 1, 25 (OH)2: 53 pg/mL

## 2020-04-25 ENCOUNTER — Ambulatory Visit (INDEPENDENT_AMBULATORY_CARE_PROVIDER_SITE_OTHER): Payer: 59 | Admitting: Psychology

## 2020-04-25 DIAGNOSIS — F4323 Adjustment disorder with mixed anxiety and depressed mood: Secondary | ICD-10-CM | POA: Diagnosis not present

## 2020-05-03 ENCOUNTER — Other Ambulatory Visit: Payer: Self-pay | Admitting: Nurse Practitioner

## 2020-05-03 DIAGNOSIS — F411 Generalized anxiety disorder: Secondary | ICD-10-CM

## 2020-05-03 DIAGNOSIS — R413 Other amnesia: Secondary | ICD-10-CM

## 2020-05-07 ENCOUNTER — Ambulatory Visit (INDEPENDENT_AMBULATORY_CARE_PROVIDER_SITE_OTHER): Payer: 59 | Admitting: Psychology

## 2020-05-07 DIAGNOSIS — F4323 Adjustment disorder with mixed anxiety and depressed mood: Secondary | ICD-10-CM | POA: Diagnosis not present

## 2020-05-09 ENCOUNTER — Encounter: Payer: Self-pay | Admitting: Neurology

## 2020-05-09 ENCOUNTER — Ambulatory Visit: Payer: 59 | Admitting: Neurology

## 2020-05-09 VITALS — BP 122/84 | HR 89 | Ht 64.0 in | Wt 282.3 lb

## 2020-05-09 DIAGNOSIS — R351 Nocturia: Secondary | ICD-10-CM

## 2020-05-09 DIAGNOSIS — G4700 Insomnia, unspecified: Secondary | ICD-10-CM

## 2020-05-09 DIAGNOSIS — R0683 Snoring: Secondary | ICD-10-CM | POA: Diagnosis not present

## 2020-05-09 DIAGNOSIS — G479 Sleep disorder, unspecified: Secondary | ICD-10-CM

## 2020-05-09 DIAGNOSIS — J4520 Mild intermittent asthma, uncomplicated: Secondary | ICD-10-CM

## 2020-05-09 DIAGNOSIS — R5383 Other fatigue: Secondary | ICD-10-CM

## 2020-05-09 NOTE — Patient Instructions (Signed)

## 2020-05-09 NOTE — Progress Notes (Signed)
Subjective:    Patient ID: Veronica [name] is a 50 y.o. female.  HPI    Huston Foley, MD, PhD [facility] [address], [address] [address] [place], [place] [address]  Dear Veronica [name],   I saw your patient, Veronica [name], upon your kind request, in my Sleep clinic today for initial consultation of her sleep disorder, in particular, difficulty with sleep maintenance, sleep disruption, nonrestorative sleep.  The patient is unaccompanied today.  As you know, Veronica [name] is a 50 year old right-handed woman with an underlying medical history of asthma, reflux disease, anxiety, allergic rhinitis, and severe obesity with a BMI of over 45, who reports an approximately 2 to 2-1/2-year history of difficulty maintaining sleep.  She has some difficulty going to sleep but often wakes up in the middle of the night without ability to go back to sleep.  Sometimes she takes PM type medication such as Tylenol PM or Advil PM either at bedtime or in the early night hours.  She has also tried melatonin.  She does admit to having thoughts racing at night and difficulty to relax at night.  She endorses stressors including work-related stress.  She is a Runner, broadcasting/film/video.  She and her husband moved from [place] back to the [place] in July 2021 and lived overseas for almost 10 years.  She feels that she has not quite settled in.  She has 3 grown children, ages 68, 22, and 29.  Her son who is 15 years old is getting ready to get married this year.  She has 2 daughters.  She is a non-smoker and does not utilize alcohol and very little caffeine.  She has had some work-related stress.  She tries to keep a steady bedtime and rise time.  Bedtime is generally between 9 and 10 PM and rise time between 5:50 AM and 6:15 AM.  She has had some snoring as reported by her husband.  Sometimes the snoring is more noticeable when she is congested.  She does not have a family history of sleep apnea but her mother has a history of light  sleeping and difficulty maintaining sleep at night.  She endorses has some anxiety and was recently started on low-dose Cymbalta which has actually helped the anxiety thus far.  She does not believe it has helped her sleep.  She denies any telltale symptoms of restless leg syndrome.  She has nocturia about once or twice per average night and denies recurrent morning headaches.  She is tired during the day.  Epworth sleepiness score is 8 out of 24, fatigue severity score is 51 out of 63.  Her Past Medical History Is Significant For: Past Medical History:  Diagnosis Date  . Asthma   . GERD (gastroesophageal reflux disease)     Her Past Surgical History Is Significant For: Past Surgical History:  Procedure Laterality Date  . ABDOMINAL HYSTERECTOMY    . CHOLECYSTECTOMY    . LAPAROSCOPIC GASTRIC BANDING      Her Family History Is Significant For: Family History  Problem Relation Age of Onset  . Diabetes Mother   . Hypertension Mother   . Diabetes Father   . Hypertension Father     Her Social History Is Significant For: Social History   Socioeconomic History  . Marital status: Married    Spouse name: Not on file  . Number of children: Not on file  . Years of education: Not on file  . Highest education level: Not on file  Occupational History  . Not on file  Tobacco Use  . Smoking status: Never Smoker  . Smokeless tobacco: Never Used  Vaping Use  . Vaping Use: Never used  Substance and Sexual Activity  . Alcohol use: Not Currently  . Drug use: Never  . Sexual activity: Not on file  Other Topics Concern  . Not on file  Social History Narrative  . Not on file   Social Determinants of Health   Financial Resource Strain: Not on file  Food Insecurity: Not on file  Transportation Needs: Not on file  Physical Activity: Not on file  Stress: Not on file  Social Connections: Not on file    Her Allergies Are:  Allergies  Allergen Reactions  . Peanut-Containing Drug  Products   . Percocet [Oxycodone-Acetaminophen]   . Shellfish Allergy   :   Her Current Medications Are:  Outpatient Encounter Medications as of 05/09/2020  Medication Sig  . desloratadine (CLARINEX) 5 MG tablet Take 1 tablet (5 mg total) by mouth daily.  . DULoxetine (CYMBALTA) 20 MG capsule Take 1 capsule (20 mg total) by mouth daily.  . famotidine (PEPCID) 20 MG tablet TAKE 1 TABLET BY MOUTH TWICE A DAY  . fluticasone-salmeterol (ADVAIR HFA) 115-21 MCG/ACT inhaler Inhale 2 puffs into the lungs 2 (two) times daily.  . mometasone (NASONEX) 50 MCG/ACT nasal spray Place 2 sprays into the nose daily.  . montelukast (SINGULAIR) 10 MG tablet Take 1 tablet (10 mg total) by mouth at bedtime.  Marland Kitchen omeprazole (PRILOSEC) 40 MG capsule OPEN 1 CAPSULE AND SPRINKLE IN APPLESAUCE TWICE DAILY.   No facility-administered encounter medications on file as of 05/09/2020.  :  Review of Systems:  Out of a complete 14 point review of systems, all are reviewed and negative with the exception of these symptoms as listed below: Review of Systems  Neurological:       Here for sleep consult. No prior sleep study. Reports she does snore at night.   Epworth Sleepiness Scale 0= would never doze 1= slight chance of dozing 2= moderate chance of dozing 3= high chance of dozing  Sitting and reading:1 Watching TV:3 Sitting inactive in a public place (ex. Theater or meeting):1 As a passenger in a car for an hour without a break:2 Lying down to rest in the afternoon:0 Sitting and talking to someone:0 Sitting quietly after lunch (no alcohol):0 In a car, while stopped in traffic:1 Total:8     Objective:  Neurological Exam  Physical Exam Physical Examination:   Vitals:   05/09/20 1514  BP: 122/84  Pulse: 89  SpO2: 94%   General Examination: The patient is a very pleasant 50 y.o. female in no acute distress. She appears well-developed and well-nourished and well groomed.   HEENT: Normocephalic,  atraumatic, pupils are equal, round and reactive to light, extraocular tracking is good without limitation to gaze excursion or nystagmus noted. Hearing is grossly intact. Face is symmetric with normal facial animation. Speech is clear with no dysarthria noted. There is no hypophonia. There is no lip, neck/head, jaw or voice tremor. Neck is supple with full range of passive and active motion. There are no carotid bruits on auscultation. Oropharynx exam reveals: mild mouth dryness, good dental hygiene and moderate airway crowding, due to smaller airway noted, Mallampati class IV, tonsils and uvula not actually fully visualized.  Tongue protrudes centrally and palate elevates symmetrically, neck circumference of 15 1/8 inches.  She has a minimal overbite.   Chest: Clear to  auscultation without wheezing, rhonchi or crackles noted.  Heart: S1+S2+0, regular and normal without murmurs, rubs or gallops noted.   Abdomen: Soft, non-tender and non-distended with normal bowel sounds appreciated on auscultation.  Skin: Warm and dry without trophic changes noted.   Musculoskeletal: exam reveals no obvious joint deformities.   Neurologically:  Mental status: The patient is awake, alert and oriented in all 4 spheres. Her immediate and remote memory, attention, language skills and fund of knowledge are appropriate. There is no evidence of aphasia, agnosia, apraxia or anomia. Speech is clear with normal prosody and enunciation. Thought process is linear. Mood is normal and affect is normal.  Cranial nerves II - XII are as described above under HEENT exam.  Motor exam: Normal bulk, strength and tone is noted. There is no tremor, fine motor skills and coordination: grossly intact.  Cerebellar testing: No dysmetria or intention tremor. There is no truncal or gait ataxia.  Sensory exam: intact to light touch in the upper and lower extremities.  Gait, station and balance: She stands easily. No veering to one side is  noted. No leaning to one side is noted. Posture is age-appropriate and stance is narrow based. Gait shows normal stride length and normal pace. No problems turning are noted.   Assessment and Plan:  In summary, AYISHA POL is a very pleasant 50 y.o.-year old female with an underlying medical history of asthma, reflux disease, anxiety, allergic rhinitis, and severe obesity with a BMI of over 45, who presents for evaluation of her sleep disturbance of at least 2 or 2-1/2 years duration.  She is particularly bothered by her difficulty maintaining sleep.  She does not wake up rested.  Underlying obstructive sleep apnea is a possibility given her crowded appearing airway and her obesity.  We talked about this at length.  She is advised to continue to work on improving her sleep hygiene, stress level and we talked about management strategies for this.  She is on low-dose Cymbalta and has benefited with respect to her anxiety.   I had a long chat with the patient about my findings and the diagnosis of OSA, its prognosis and treatment options. We talked about medical treatments, surgical interventions and non-pharmacological approaches. I explained in particular the risks and ramifications of untreated moderate to severe OSA, especially with respect to developing cardiovascular disease down the Road, including congestive heart failure, difficult to treat hypertension, cardiac arrhythmias, or stroke. Even type 2 diabetes has, in part, been linked to untreated OSA. Symptoms of untreated OSA include daytime sleepiness, memory problems, mood irritability and mood disorder such as depression and anxiety, lack of energy, as well as recurrent headaches, especially morning headaches. We talked about trying to maintain a healthy lifestyle in general, as well as the importance of weight control. We also talked about the importance of good sleep hygiene.  She is working on weight loss.  She is going to start going to the  aquatic [facility]. I recommended the following at this time: sleep study.   I explained the sleep test procedure to the patient and also outlined possible surgical and non-surgical treatment options of OSA, including the use of a custom-made dental device (which would require a referral to a specialist dentist or oral surgeon), upper airway surgical options, such as traditional UPPP or a novel less invasive surgical option in the form of Inspire hypoglossal nerve stimulation (which would involve a referral to an ENT surgeon). I also explained the CPAP treatment option to  the patient, who indicated that she would be willing to try CPAP if the need arises. I explained the importance of being compliant with PAP treatment, not only for insurance purposes but primarily to improve Her symptoms, and for the patient's long term health benefit, including to reduce Her cardiovascular risks. I answered all her questions today and the patient was in agreement. I plan to see her back after the sleep study is completed and encouraged her to call with any interim questions, concerns, problems or updates.   Thank you very much for allowing me to participate in the care of this nice patient. If I can be of any further assistance to you please do not hesitate to call me at [phone].  Sincerely,   Star Age, MD, PhD

## 2020-05-11 ENCOUNTER — Telehealth: Payer: 59 | Admitting: Nurse Practitioner

## 2020-05-16 ENCOUNTER — Other Ambulatory Visit: Payer: Self-pay

## 2020-05-17 ENCOUNTER — Ambulatory Visit: Payer: 59 | Admitting: Nurse Practitioner

## 2020-05-21 ENCOUNTER — Ambulatory Visit: Payer: 59 | Admitting: Psychology

## 2020-05-28 ENCOUNTER — Ambulatory Visit (INDEPENDENT_AMBULATORY_CARE_PROVIDER_SITE_OTHER): Payer: 59

## 2020-05-28 ENCOUNTER — Other Ambulatory Visit: Payer: Self-pay

## 2020-05-28 ENCOUNTER — Ambulatory Visit: Payer: 59 | Admitting: Podiatry

## 2020-05-28 DIAGNOSIS — R52 Pain, unspecified: Secondary | ICD-10-CM

## 2020-05-28 DIAGNOSIS — M25571 Pain in right ankle and joints of right foot: Secondary | ICD-10-CM

## 2020-05-28 DIAGNOSIS — M25572 Pain in left ankle and joints of left foot: Secondary | ICD-10-CM

## 2020-05-28 MED ORDER — BETAMETHASONE SOD PHOS & ACET 6 (3-3) MG/ML IJ SUSP
12.0000 mg | Freq: Once | INTRAMUSCULAR | Status: AC
  Administered 2020-05-28: 12 mg

## 2020-06-03 ENCOUNTER — Ambulatory Visit (INDEPENDENT_AMBULATORY_CARE_PROVIDER_SITE_OTHER): Payer: 59 | Admitting: Neurology

## 2020-06-03 DIAGNOSIS — G479 Sleep disorder, unspecified: Secondary | ICD-10-CM

## 2020-06-03 DIAGNOSIS — G4733 Obstructive sleep apnea (adult) (pediatric): Secondary | ICD-10-CM

## 2020-06-03 DIAGNOSIS — R5383 Other fatigue: Secondary | ICD-10-CM

## 2020-06-03 DIAGNOSIS — R0683 Snoring: Secondary | ICD-10-CM

## 2020-06-03 DIAGNOSIS — G4700 Insomnia, unspecified: Secondary | ICD-10-CM

## 2020-06-03 DIAGNOSIS — R351 Nocturia: Secondary | ICD-10-CM

## 2020-06-03 DIAGNOSIS — J4520 Mild intermittent asthma, uncomplicated: Secondary | ICD-10-CM

## 2020-06-05 ENCOUNTER — Ambulatory Visit: Payer: 59 | Admitting: Nurse Practitioner

## 2020-06-12 NOTE — Addendum Note (Signed)
Addended by: Huston Foley on: 06/12/2020 05:21 PM   Modules accepted: Orders

## 2020-06-12 NOTE — Progress Notes (Signed)
Patient referred by Alysia Penna, NP, seen by me on 05/09/20, patient had a HST on 06/03/20.    Please call and notify the patient that the recent home sleep test showed obstructive sleep apnea in the moderate range (but she had dropped her O2 into the severe desaturation range at 62%). I recommend treatment in the form of autoPAP, which means, that we don't have to bring her in for a sleep study with CPAP, but will let her start using a so called autoPAP machine at home, which is a CPAP-like machine with self-adjusting pressures. We will send the order to a local DME company (of her choice, or as per insurance requirement). The DME representative will fit her with a mask, educate her on how to use the machine, how to put the mask on, etc. I have placed an order in the chart. Please send the order, talk to patient, send report to referring MD. We will need a FU in sleep clinic for 10 weeks post-PAP set up, please arrange that with me or one of our NPs. Also reinforce the need for compliance with treatment. Thanks,   Huston Foley, MD, PhD [facility])

## 2020-06-12 NOTE — Procedures (Signed)
   [facility]  HOME SLEEP TEST (Watch PAT)  STUDY DATE: 06-03-20  DOB: June 12, 1970  MRN: [id number]  ORDERING CLINICIAN: Huston Foley, MD, PhD   REFERRING CLINICIAN: Nche, Bonna Gains, NP   CLINICAL INFORMATION/HISTORY: 50 year old woman with a history of asthma, reflux disease, anxiety, allergic rhinitis, and severe obesity with a BMI of over 45, who reports snoring, and difficulty maintaining sleep.   Epworth sleepiness score: 8/24.  BMI: 48.46 kg/m  Neck Circumference: 15.125 "  FINDINGS:   Total Record Time (hours, min): 6 H 25 min  Total Sleep Time (hours, min):  5 H 3 min   Percent REM (%):    30.01 %   Calculated pAHI (per hour): 25.1     REM pAHI: 53.5    NREM pAHI: 13.4 Supine AHI:32.6   Oxygen Saturation (%) Mean: 94  Minimum oxygen saturation (%):         62   O2 Saturation Range (%): 62-99  O2Saturation (minutes) <=88%: 14.8 min   Pulse Mean (bpm):    74  Pulse Range (54-96)   IMPRESSION: OSA (obstructive sleep apnea)  RECOMMENDATION:  This home sleep test demonstrates moderate obstructive sleep apnea with a total AHI of 25.1/hour and O2 nadir of 62%, which is in the severe range. Snoring appeared to be in the mild to moderate range. Treatment with positive airway pressure is recommended. The patient will be advised to proceed with an autoPAP titration/trial at home for now. A full night titration study may be considered to optimize treatment settings, if needed down the road. Please note that untreated obstructive sleep apnea may carry additional perioperative morbidity. Patients with significant obstructive sleep apnea should receive perioperative PAP therapy and the surgeons and particularly the anesthesiologist should be informed of the diagnosis and the severity of the sleep disordered breathing. Alternative treatment options may be limited due to the severity of the desaturations. Weight loss is recommend. The patient should be cautioned not to  drive, work at heights, or operate dangerous or heavy equipment when tired or sleepy. Review and reiteration of good sleep hygiene measures should be pursued with any patient. Other causes of the patient's symptoms, including circadian rhythm disturbances, an underlying mood disorder, medication effect and/or an underlying medical problem cannot be ruled out based on this test. Clinical correlation is recommended. The patient and her referring provider will be notified of the test results. The patient will be seen in follow up in sleep clinic at [facility].  I certify that I have reviewed the raw data recording prior to the issuance of this report in accordance with the standards of the American Academy of Sleep Medicine (AASM).  INTERPRETING PHYSICIAN:  Huston Foley, MD, PhD  Board Certified in Neurology and Sleep Medicine [facility] [address] [place], [place] [address] [phone]

## 2020-06-13 ENCOUNTER — Telehealth: Payer: Self-pay

## 2020-06-13 ENCOUNTER — Encounter: Payer: Self-pay | Admitting: Nurse Practitioner

## 2020-06-13 DIAGNOSIS — G4733 Obstructive sleep apnea (adult) (pediatric): Secondary | ICD-10-CM | POA: Insufficient documentation

## 2020-06-13 NOTE — Telephone Encounter (Signed)
I called the pt and started to review results. I advised that OSA and severe desaturations in O2 was seen and that we recommended an auto cpap. Pt stated she was in class and could not review results at this time. She wanted to call back tomorrow to review. I advised the clinical staff are not here on fridays and she sts she will call back later today to review results. Will wait to hear back from pt.

## 2020-06-13 NOTE — Telephone Encounter (Signed)
-----   Message from Huston Foley, MD sent at 06/12/2020  5:21 PM EDT ----- Patient referred by Alysia Penna, NP, seen by me on 05/09/20, patient had a HST on 06/03/20.    Please call and notify the patient that the recent home sleep test showed obstructive sleep apnea in the moderate range (but she had dropped her O2 into the severe desaturation range at 62%). I recommend treatment in the form of autoPAP, which means, that we don't have to bring her in for a sleep study with CPAP, but will let her start using a so called autoPAP machine at home, which is a CPAP-like machine with self-adjusting pressures. We will send the order to a local DME company (of her choice, or as per insurance requirement). The DME representative will fit her with a mask, educate her on how to use the machine, how to put the mask on, etc. I have placed an order in the chart. Please send the order, talk to patient, send report to referring MD. We will need a FU in sleep clinic for 10 weeks post-PAP set up, please arrange that with me or one of our NPs. Also reinforce the need for compliance with treatment. Thanks,   Huston Foley, MD, PhD [facility])

## 2020-06-19 ENCOUNTER — Other Ambulatory Visit: Payer: Self-pay | Admitting: Podiatry

## 2020-06-19 DIAGNOSIS — M25571 Pain in right ankle and joints of right foot: Secondary | ICD-10-CM

## 2020-06-26 ENCOUNTER — Other Ambulatory Visit: Payer: Self-pay | Admitting: Gastroenterology

## 2020-06-27 ENCOUNTER — Other Ambulatory Visit: Payer: Self-pay | Admitting: Gastroenterology

## 2020-06-28 ENCOUNTER — Other Ambulatory Visit: Payer: Self-pay

## 2020-06-28 ENCOUNTER — Ambulatory Visit (INDEPENDENT_AMBULATORY_CARE_PROVIDER_SITE_OTHER): Payer: 59 | Admitting: Podiatry

## 2020-06-28 DIAGNOSIS — M25571 Pain in right ankle and joints of right foot: Secondary | ICD-10-CM | POA: Diagnosis not present

## 2020-06-28 DIAGNOSIS — M25572 Pain in left ankle and joints of left foot: Secondary | ICD-10-CM

## 2020-06-28 DIAGNOSIS — M722 Plantar fascial fibromatosis: Secondary | ICD-10-CM | POA: Diagnosis not present

## 2020-06-28 MED ORDER — METHYLPREDNISOLONE 4 MG PO TBPK: ORAL_TABLET | ORAL | 0 refills | Status: DC

## 2020-07-03 ENCOUNTER — Other Ambulatory Visit: Payer: Self-pay

## 2020-07-03 ENCOUNTER — Ambulatory Visit (INDEPENDENT_AMBULATORY_CARE_PROVIDER_SITE_OTHER): Payer: 59 | Admitting: Podiatry

## 2020-07-03 DIAGNOSIS — M25571 Pain in right ankle and joints of right foot: Secondary | ICD-10-CM

## 2020-07-03 DIAGNOSIS — M25572 Pain in left ankle and joints of left foot: Secondary | ICD-10-CM

## 2020-07-03 NOTE — Progress Notes (Signed)
Patient presents to be casted for orthotics.  A foam impression was casted for her right and left foot.  Patient is a size 10  Patient will be contacted when the orthotics are ready for pick up.

## 2020-07-05 NOTE — Progress Notes (Signed)
  Subjective:  Patient ID: Veronica [name], female    DOB: 11/03/1970,  MRN: [id number]  Chief Complaint  Patient presents with  . Foot Pain    Bilateral foot pain - pt state her feet is always hurting -wears compression socks-can hardly walk sometimes     50 y.o. female presents with the above complaint. History confirmed with patient.  Pain rated a 5 out of 10  Objective:  Physical Exam: warm, good capillary refill, no trophic changes or ulcerative lesions, normal DP and PT pulses and normal sensory exam. POP medial calc tuber bilat, STJ  No images are attached to the encounter.   Radiographs: X-ray of both feet: no fracture, dislocation, swelling or degenerative changes noted and pes planus Assessment:   1. Arthralgia, hindfoot, left   2. Arthralgia of hindfoot, right   3. Pain      Plan:  Patient was evaluated and treated and all questions answered.  Plantar Fasciitis, sinus tarsitis -XR reviewed with patient -Educated patient on stretching and icing of the affected limb -Trilock braces x2 dispensed.  Procedure: Injection Intermediate Joint Consent: Verbal consent obtained. Location: Bilateral sinus tarsi. Skin Prep: alcohol. Injectate: 1 cc 0.5% marcaine plain, 1 cc betamethasone acetate-betamethasone sodium phosphate Disposition: Patient tolerated procedure well. Injection site dressed with a band-aid.  Return in about 4 weeks (around 06/25/2020) for Plantar fasciitis, Arthritis, Bilateral.

## 2020-07-08 NOTE — Telephone Encounter (Signed)
After not hearing back from the pt, I called her today.  Pt and I reviewed the sleep study results. Pt reports hesitations on starting on PAP and wanted to have a visit with the Dr to review.   Pt was offered several appts but declined due to work schedule/vactations. First available appt for the pt was 08/21/20 at 830, I have scheduled.

## 2020-07-09 ENCOUNTER — Ambulatory Visit: Payer: 59 | Admitting: Nurse Practitioner

## 2020-07-24 NOTE — Progress Notes (Signed)
  Subjective:  Patient ID: Lincoln Brigham, female    DOB: 1970/03/31,  MRN: [id number]  Chief Complaint  Patient presents with  . Plantar Fasciitis     Return in about 4 weeks (around 06/25/2020) for Plantar fasciitis, Arthritis, Bilateral    50 y.o. female presents with the above complaint. History confirmed with patient. States the injections helped for a bit but are not really any better.  Objective:  Physical Exam: warm, good capillary refill, no trophic changes or ulcerative lesions, normal DP and PT pulses and normal sensory exam. POP medial calc tuber bilat, STJ bilat.   Assessment:   1. Arthralgia of hindfoot, right   2. Arthralgia, hindfoot, left   3. Plantar fasciitis      Plan:  Patient was evaluated and treated and all questions answered.  Plantar Fasciitis  -Rx Medrol pack. Educated on r/b/a of medication -Would benefit from [employer]. Will make appt for fabrication. -Swapped out braces - was given incorrect braces last visit.  Return in about 4 weeks (around 07/26/2020).

## 2020-08-02 ENCOUNTER — Ambulatory Visit: Payer: 59 | Admitting: Podiatry

## 2020-08-02 ENCOUNTER — Other Ambulatory Visit: Payer: Self-pay

## 2020-08-02 DIAGNOSIS — M25571 Pain in right ankle and joints of right foot: Secondary | ICD-10-CM | POA: Diagnosis not present

## 2020-08-02 DIAGNOSIS — M25572 Pain in left ankle and joints of left foot: Secondary | ICD-10-CM | POA: Diagnosis not present

## 2020-08-02 DIAGNOSIS — M722 Plantar fascial fibromatosis: Secondary | ICD-10-CM | POA: Diagnosis not present

## 2020-08-02 MED ORDER — MELOXICAM 15 MG PO TABS: 15.0000 mg | ORAL_TABLET | Freq: Every day | ORAL | 0 refills | Status: DC

## 2020-08-02 NOTE — Progress Notes (Signed)
  Subjective:  Patient ID: Veronica [name], female    DOB: [birth year],  MRN: [id number]  No chief complaint on file.   50 y.o. female presents with the above complaint. States the steroid pack did not help but she took her husband's Meloxicam and it did help. Requesting Rx of this today.  Objective:  Physical Exam: warm, good capillary refill, no trophic changes or ulcerative lesions, normal DP and PT pulses and normal sensory exam. Mild POP medial calc tuber bilat, modPOP STJ bilat.  Assessment:   1. Arthralgia, hindfoot, left   2. Arthralgia of hindfoot, right   3. Plantar fasciitis      Plan:  Patient was evaluated and treated and all questions answered.  Plantar Fasciitis  -Continue stretching and icing  -Rx meloxicam -Pending CMOs -F/u for CMO pickup  No follow-ups on file.

## 2020-08-21 ENCOUNTER — Ambulatory Visit: Payer: Self-pay | Admitting: Neurology

## 2020-09-01 ENCOUNTER — Other Ambulatory Visit: Payer: Self-pay | Admitting: Podiatry

## 2020-09-03 NOTE — Telephone Encounter (Signed)
Please advise 

## 2020-10-01 ENCOUNTER — Ambulatory Visit: Payer: 59 | Admitting: Family Medicine

## 2020-10-01 ENCOUNTER — Encounter: Payer: Self-pay | Admitting: Family Medicine

## 2020-10-01 ENCOUNTER — Other Ambulatory Visit: Payer: Self-pay

## 2020-10-01 VITALS — BP 124/84 | HR 88 | Temp 97.6°F | Ht 64.0 in | Wt 303.2 lb

## 2020-10-01 DIAGNOSIS — H6982 Other specified disorders of Eustachian tube, left ear: Secondary | ICD-10-CM

## 2020-10-01 MED ORDER — PSEUDOEPHEDRINE HCL 60 MG PO TABS: 60.0000 mg | ORAL_TABLET | Freq: Four times a day (QID) | ORAL | 0 refills | Status: DC | PRN

## 2020-10-01 MED ORDER — FLUTICASONE PROPIONATE 50 MCG/ACT NA SUSP: 2.0000 | Freq: Every day | NASAL | 6 refills | Status: AC

## 2020-10-01 NOTE — Progress Notes (Signed)
[facility] [address] [place] [address] Dept: [phone] Dept Fax: [phone]  Office Visit  Subjective:    Patient ID: Veronica [name], female    DOB: Apr 08, 1970, 50 y.o..   MRN: [id number]  Chief Complaint  Patient presents with   Acute Visit    C/o having LT side jaw pain x 3-4 days.  She has been using Tropex ear drops with little relief.  Also having pian in the RT elbow months.  She taking Tylenol arthritis  and meloxicam.      History of Present Illness:  Patient is in today complaining of left ear pain. over the past 3-4 days. She notes that she is using Toprex drops (phenazone 5%- equivalent to antipyrene in [place]). Although the drops helped, she was still having some pain. She became concerned about needing to have her ear evaluated. MS. Mazurkiewicz has had a past history in ~ 2017 of significant left TMJ pain, requiring an injection by a Investment banker, corporate. She has had a mild flare of this int he past 4 years and wasn't sure if this was involved in her current symptoms. She does use a nasal steroid spray and daily Zyrtec for management of allergic rhinitis symptoms.  Past Medical History: Patient Active Problem List   Diagnosis Date Noted   OSA (obstructive sleep apnea) 06/13/2020   GAD (generalized anxiety disorder) 04/10/2020   Memory difficulty 04/10/2020   Asthma 04/09/2020   S/P hysterectomy 04/09/2020   Insomnia 04/09/2020   Past Surgical History:  Procedure Laterality Date   ABDOMINAL HYSTERECTOMY     CHOLECYSTECTOMY     LAPAROSCOPIC GASTRIC BANDING     Family History  Problem Relation Age of Onset   Diabetes Mother    Hypertension Mother    Diabetes Father    Hypertension Father    Outpatient Medications Prior to Visit  Medication Sig Dispense Refill   desloratadine (CLARINEX) 5 MG tablet Take 1 tablet (5 mg total) by mouth daily. 90 tablet 3   DULoxetine (CYMBALTA) 20 MG capsule Take 1  capsule (20 mg total) by mouth daily. 30 capsule 5   fluticasone-salmeterol (ADVAIR HFA) 115-21 MCG/ACT inhaler Inhale 2 puffs into the lungs 2 (two) times daily.     meloxicam (MOBIC) 15 MG tablet TAKE 1 TABLET (15 MG TOTAL) BY MOUTH DAILY. 30 tablet 0   mometasone (NASONEX) 50 MCG/ACT nasal spray Place 2 sprays into the nose daily. 1 each 12   montelukast (SINGULAIR) 10 MG tablet Take 1 tablet (10 mg total) by mouth at bedtime. 90 tablet 3   famotidine (PEPCID) 20 MG tablet TAKE 1 TABLET BY MOUTH TWICE A DAY (Patient not taking: Reported on 10/01/2020) 60 tablet 2   methylPREDNISolone (MEDROL DOSEPAK) 4 MG TBPK tablet 6 Day Taper Pack. Take as Directed. (Patient not taking: Reported on 10/01/2020) 21 tablet 0   omeprazole (PRILOSEC) 40 MG capsule OPEN 1 CAPSULE AND SPRINKLE IN APPLESAUCE TWICE DAILY. (Patient not taking: Reported on 10/01/2020) 60 capsule 3   No facility-administered medications prior to visit.   Allergies  Allergen Reactions   Peanut-Containing Drug Products    Percocet [Oxycodone-Acetaminophen]    Shellfish Allergy      Objective:   Today's Vitals   10/01/20 0917  BP: 124/84  Pulse: 88  Temp: 97.6 F (36.4 C)  TempSrc: Temporal  SpO2: 95%  Weight: (!) 303 lb 3.2 oz (137.5 kg)  Height: 5\' 4"  (1.626 m)   Body mass  index is 52.04 kg/m.   General: Well developed, well nourished. No acute distress. HEENT: Normocephalic, non-traumatic. External ears normal. EAC clear bilaterally. The left TM is bulging, but   clear and slightly dull. Nose shows mild congestion on the left and more so ont he right. There is redness to   the mucosa.  There is mild tenderness to palpation over the left TMJ area. Mucous membranes moist.   Oropharynx clear. Good dentition. Psych: Alert and oriented. Normal mood and affect.  Health Maintenance Due  Topic Date Due   HIV Screening  Never done   Hepatitis C Screening  Never done   TETANUS/TDAP  Never done   PAP SMEAR-Modifier  Never  done   COLONOSCOPY (Pts 45-110yrs Insurance coverage will need to be confirmed)  Never done   MAMMOGRAM  04/15/2018   Zoster Vaccines- Shingrix (1 of 2) Never done   COVID-19 Vaccine (4 - Booster for Moderna series) 08/04/2020     Assessment & Plan:   1. Dysfunction of left eustachian tube Ms. Schlachter appears to have some middle ear congestion on the left, which is likely the source of her pain. She can continue to use the ear drops. I would like her to add to her daily Zyrtec a decongestant for one week. I will also have her increase her Flonase temporarily to twice a day. She should follow-up after 1 week if no improvement.  - fluticasone (FLONASE) 50 MCG/ACT nasal spray; Place 2 sprays into both nostrils daily.  Dispense: 16 g; Refill: 6 - pseudoephedrine (SUDAFED) 60 MG tablet; Take 1 tablet (60 mg total) by mouth every 6 (six) hours as needed for congestion.  Dispense: 30 tablet; Refill: 0  Loyola Mast, MD

## 2020-10-01 NOTE — Patient Instructions (Signed)
Eustachian Tube Dysfunction  Eustachian tube dysfunction refers to a condition in which a blockage develops in the narrow passage that connects the middle ear to the back of the nose (eustachian tube). The eustachian tube regulates air pressure in the middle ear by letting air move between the ear and nose. It also helps to drain fluid from the middle earspace. Eustachian tube dysfunction can affect one or both ears. When the eustachian tube does not function properly, air pressure, fluid, or both can build up inthe middle ear. What are the causes? This condition occurs when the eustachian tube becomes blocked or cannot open normally. Common causes of this condition include: Ear infections. Colds and other infections that affect the nose, mouth, and throat (upper respiratory tract). Allergies. Irritation from cigarette smoke. Irritation from stomach acid coming up into the esophagus (gastroesophageal reflux). The esophagus is the tube that carries food from the mouth to the stomach. Sudden changes in air pressure, such as from descending in an airplane or scuba diving. Abnormal growths in the nose or throat, such as: Growths that line the nose (nasal polyps). Abnormal growth of cells (tumors). Enlarged tissue at the back of the throat (adenoids). What increases the risk? You are more likely to develop this condition if: You smoke. You are overweight. You are a child who has: Certain birth defects of the mouth, such as cleft palate. Large tonsils or adenoids. What are the signs or symptoms? Common symptoms of this condition include: A feeling of fullness in the ear. Ear pain. Clicking or popping noises in the ear. Ringing in the ear. Hearing loss. Loss of balance. Dizziness. Symptoms may get worse when the air pressure around you changes, such as whenyou travel to an area of high elevation, fly on an airplane, or go scuba diving. How is this diagnosed? This condition may be diagnosed  based on: Your symptoms. A physical exam of your ears, nose, and throat. Tests, such as those that measure: The movement of your eardrum (tympanogram). Your hearing (audiometry). How is this treated? Treatment depends on the cause and severity of your condition. In mild cases, you may relieve your symptoms by moving air into your ears. This is called "popping the ears." In more severe cases, or if you have symptoms of fluid in your ears, treatment may include: Medicines to relieve congestion (decongestants). Medicines that treat allergies (antihistamines). Nasal sprays or ear drops that contain medicines that reduce swelling (steroids). A procedure to drain the fluid in your eardrum (myringotomy). In this procedure, a small tube is placed in the eardrum to: Drain the fluid. Restore the air in the middle ear space. A procedure to insert a balloon device through the nose to inflate the opening of the eustachian tube (balloon dilation). Follow these instructions at home: Lifestyle Do not do any of the following until your health care provider approves: Travel to high altitudes. Fly in airplanes. Work in a pressurized cabin or room. Scuba dive. Do not use any products that contain nicotine or tobacco, such as cigarettes and e-cigarettes. If you need help quitting, ask your health care provider. Keep your ears dry. Wear fitted earplugs during showering and bathing. Dry your ears completely after. General instructions Take over-the-counter and prescription medicines only as told by your health care provider. Use techniques to help pop your ears as recommended by your health care provider. These may include: Chewing gum. Yawning. Frequent, forceful swallowing. Closing your mouth, holding your nose closed, and gently blowing as if you   are trying to blow air out of your nose. Keep all follow-up visits as told by your health care provider. This is important. Contact a health care provider  if: Your symptoms do not go away after treatment. Your symptoms come back after treatment. You are unable to pop your ears. You have: A fever. Pain in your ear. Pain in your head or neck. Fluid draining from your ear. Your hearing suddenly changes. You become very dizzy. You lose your balance. Summary Eustachian tube dysfunction refers to a condition in which a blockage develops in the eustachian tube. It can be caused by ear infections, allergies, inhaled irritants, or abnormal growths in the nose or throat. Symptoms include ear pain, hearing loss, or ringing in the ears. Mild cases are treated with maneuvers to unblock the ears, such as yawning or ear popping. Severe cases are treated with medicines. Surgery may also be done (rare). This information is not intended to replace advice given to you by your health care provider. Make sure you discuss any questions you have with your healthcare provider. Document Revised: 06/29/2017 Document Reviewed: 06/29/2017 Elsevier Patient Education  2022 Elsevier Inc.  

## 2020-10-10 ENCOUNTER — Other Ambulatory Visit: Payer: Self-pay | Admitting: Nurse Practitioner

## 2020-10-10 DIAGNOSIS — F411 Generalized anxiety disorder: Secondary | ICD-10-CM

## 2020-10-10 DIAGNOSIS — R413 Other amnesia: Secondary | ICD-10-CM

## 2020-10-15 ENCOUNTER — Other Ambulatory Visit: Payer: Self-pay | Admitting: Podiatry

## 2020-10-16 ENCOUNTER — Telehealth: Payer: Self-pay | Admitting: Podiatry

## 2020-10-16 NOTE — Telephone Encounter (Signed)
Patient called about Meloxicam refill

## 2020-10-16 NOTE — Telephone Encounter (Signed)
Patient called and wanted to request a refill on Meloxicam

## 2020-10-16 NOTE — Telephone Encounter (Signed)
Pt left message checking to see if orthotics are in she was measured in April..  I returned the call and left message they are in and there was a message left at the end of may. But to please call to schedule an appt to pick them up.Marland KitchenMarland Kitchen

## 2020-10-17 NOTE — Telephone Encounter (Signed)
Refilled please inform 

## 2020-10-25 NOTE — Telephone Encounter (Signed)
Last OV 04/09/20 Pt canceled last 2 appointments (06/05/20) (07/09/20) Please advise

## 2020-10-27 ENCOUNTER — Other Ambulatory Visit: Payer: Self-pay | Admitting: Podiatry

## 2020-10-27 ENCOUNTER — Other Ambulatory Visit: Payer: Self-pay | Admitting: Nurse Practitioner

## 2020-10-27 DIAGNOSIS — R413 Other amnesia: Secondary | ICD-10-CM

## 2020-10-27 DIAGNOSIS — F411 Generalized anxiety disorder: Secondary | ICD-10-CM

## 2020-10-29 NOTE — Telephone Encounter (Signed)
Please Advise

## 2020-11-18 ENCOUNTER — Other Ambulatory Visit: Payer: Self-pay

## 2020-11-18 ENCOUNTER — Ambulatory Visit: Payer: 59 | Admitting: Family Medicine

## 2020-11-18 ENCOUNTER — Encounter: Payer: Self-pay | Admitting: Family Medicine

## 2020-11-18 ENCOUNTER — Ambulatory Visit: Payer: Self-pay

## 2020-11-18 VITALS — BP 122/84 | HR 90 | Ht 64.0 in | Wt 302.6 lb

## 2020-11-18 DIAGNOSIS — G8929 Other chronic pain: Secondary | ICD-10-CM

## 2020-11-18 DIAGNOSIS — M79671 Pain in right foot: Secondary | ICD-10-CM

## 2020-11-18 DIAGNOSIS — M25561 Pain in right knee: Secondary | ICD-10-CM

## 2020-11-18 DIAGNOSIS — M25562 Pain in left knee: Secondary | ICD-10-CM | POA: Diagnosis not present

## 2020-11-18 DIAGNOSIS — M79672 Pain in left foot: Secondary | ICD-10-CM | POA: Diagnosis not present

## 2020-11-18 NOTE — Patient Instructions (Addendum)
Thank you for coming in today.   Meloxicam max dose 15mg  a day total.   Tylenol arthritis up to 3x daily.   I can do cortisone shots every 3 months and gel shots every 6 months and PRP whenever.   I've referred you to Physical Therapy.  Let know if you don't hear from them in one week.   I recommend you obtained a compression sleeve to help with your joint problems. There are many options on the market however I recommend obtaining a full ankle Body Helix compression sleeve.  You can find information (including how to appropriate measure yourself for sizing) can be found at www.Body [place].  Many of these products are health savings account (HSA) eligible.   You can use the compression sleeve at any time throughout the day but is most important to use while being active as well as for 2 hours post-activity.   It is appropriate to ice following activity with the compression sleeve in place.

## 2020-11-18 NOTE — Progress Notes (Signed)
I, Christoper Fabian, LAT, ATC, am serving as scribe for Dr. [name].  Subjective:    CC: B knee and B ankle pain  HPI: Pt is a 50 y/o female c/o chronic B foot and B knee pain. Pt was previously seen by podiatry for B plantar fascitis. She has chronic foot pain x 20 years.  She has tried medications, foot injections and custom orthotics.  Her L knee pain has bothered her for 8-9 years and had gel injections while living in [place].  Now her R knee has started bothering her too.  She has tried kinesio-taping on her knees.  Her most recent knee injections were done at [facility]  in [place] about 2 weeks ago.    Swelling: yes in her B knees Mechanical symptoms: yes in her B knees Aggravates: weight-bearing activity; walking; sometimes at rest Treatments tried: Meloxicam; B knee injections and foot injections; custom orthotics; knee brace; compression sleeves; ice; Tylnol Arthritis; prednisone  Dx imaging: 05/28/20 L foot & R ankle XR; prior knee X-rays done in [place] and at [facility]  Pertinent review of Systems: No fevers or chills  Relevant historical information: Recent steroid and hyaluronic acid injections both knees.   Objective:    Vitals:   11/18/20 1612  BP: 122/84  Pulse: 90  SpO2: 94%   General: Well Developed, well nourished, and in no acute distress.   MSK: Bilateral knees mild effusion with normal motion with crepitation.   Slight laxity to MCL stress test bilaterally. Intact strength. Antalgic gait.  Bilateral feet have some pes planus with mild swelling medial to anterior ankles and some tenderness to palpation along medial to dorsal midfoot.  Lab and Radiology Results  X-ray images bilateral feet obtained podiatry on May 28, 2020 personally and independently interpreted  No acute fractures.  Midfoot DJD bilaterally.     Impression and Recommendations:    Assessment and Plan: 50 y.o. female with bilateral knee pain due to significant DJD  according to report of x-ray from [facility].  Patient recently had bilateral hyaluronic acid injections which helped a little. Certainly can repeat gel shots every 6 months and steroid shots every 3 months.  She is not had trial of physical therapy which is an obvious thing to proceed to at this point.  Plan to refer to PT primarily to work on quadricep strengthening.  Weight loss certainly will help as well.  BMI is currently 51.94.  Weight loss will help with current knee pain.  Additionally she will need to have significant weight loss prior to proceeding to total knee replacement which I think ultimately is going to the outcome for her knees.  BMI needs to be less than 40. Recommend [place] healthy weight and wellness.  As for the ankle and foot pain she would do well with again physical therapy and high-quality compression sleeves.  Recommend body helix compression sleeve and Voltaren gel.  Discussed maximize Tylenol and meloxicam.  Recheck again in about 6 weeks.  PDMP not reviewed this encounter. Orders Placed This Encounter  Procedures   Ambulatory referral to Physical Therapy    Referral Priority:   Routine    Referral Type:   Physical Medicine    Referral Reason:   Specialty Services Required    Requested Specialty:   Physical Therapy    Number of Visits Requested:   1   No orders of the defined types were placed in this encounter.   Discussed warning signs or symptoms. Please see  discharge instructions. Patient expresses understanding.   The above documentation has been reviewed and is accurate and complete Lynne Leader, M.D.

## 2020-12-06 ENCOUNTER — Other Ambulatory Visit: Payer: Self-pay | Admitting: Podiatry

## 2020-12-10 ENCOUNTER — Other Ambulatory Visit: Payer: Self-pay | Admitting: Nurse Practitioner

## 2020-12-10 DIAGNOSIS — F411 Generalized anxiety disorder: Secondary | ICD-10-CM

## 2020-12-10 DIAGNOSIS — R413 Other amnesia: Secondary | ICD-10-CM

## 2020-12-12 ENCOUNTER — Telehealth: Payer: Self-pay | Admitting: Nurse Practitioner

## 2020-12-13 ENCOUNTER — Encounter: Payer: Self-pay | Admitting: Sports Medicine

## 2020-12-13 ENCOUNTER — Ambulatory Visit: Payer: Self-pay

## 2020-12-13 ENCOUNTER — Other Ambulatory Visit: Payer: Self-pay

## 2020-12-13 ENCOUNTER — Ambulatory Visit: Payer: BC Managed Care – PPO | Admitting: Sports Medicine

## 2020-12-13 VITALS — BP 130/98 | HR 105 | Ht 64.0 in | Wt 302.0 lb

## 2020-12-13 DIAGNOSIS — M2142 Flat foot [pes planus] (acquired), left foot: Secondary | ICD-10-CM

## 2020-12-13 DIAGNOSIS — M2550 Pain in unspecified joint: Secondary | ICD-10-CM

## 2020-12-13 DIAGNOSIS — M25562 Pain in left knee: Secondary | ICD-10-CM | POA: Diagnosis not present

## 2020-12-13 DIAGNOSIS — M25561 Pain in right knee: Secondary | ICD-10-CM | POA: Diagnosis not present

## 2020-12-13 DIAGNOSIS — M2141 Flat foot [pes planus] (acquired), right foot: Secondary | ICD-10-CM | POA: Diagnosis not present

## 2020-12-13 DIAGNOSIS — M79671 Pain in right foot: Secondary | ICD-10-CM

## 2020-12-13 DIAGNOSIS — M79672 Pain in left foot: Secondary | ICD-10-CM

## 2020-12-13 DIAGNOSIS — M65311 Trigger thumb, right thumb: Secondary | ICD-10-CM

## 2020-12-13 DIAGNOSIS — G8929 Other chronic pain: Secondary | ICD-10-CM

## 2020-12-13 MED ORDER — METHYLPREDNISOLONE ACETATE 80 MG/ML IJ SUSP
80.0000 mg | Freq: Once | INTRAMUSCULAR | Status: AC
  Administered 2020-12-13: 80 mg via INTRAMUSCULAR

## 2020-12-13 MED ORDER — KETOROLAC TROMETHAMINE 60 MG/2ML IM SOLN
60.0000 mg | Freq: Once | INTRAMUSCULAR | Status: AC
  Administered 2020-12-13: 60 mg via INTRAMUSCULAR

## 2020-12-13 NOTE — Telephone Encounter (Signed)
Pt is checking on status of this message. She has been out of this script for 4 days. She is needing to get back on it soon. Please advise if able to

## 2020-12-13 NOTE — Patient Instructions (Addendum)
   Hands: Start warm baths daily Trigger thumb: Thumb spica brace on [employer]  Feet: Evaluated at [employer] for insoles  Knees: Start Water Aerobics, elliptical, or stationary bike Get Xray from [facility]  See me again in 3 weeks

## 2020-12-13 NOTE — Telephone Encounter (Signed)
Chart supports rx refill  Last ov: 10/01/2020 Last refill: 04/10/2020

## 2020-12-13 NOTE — Telephone Encounter (Signed)
Refill request approved. Sw, cma

## 2020-12-13 NOTE — Progress Notes (Signed)
Aleen Sells D.[name] [address] [place] [address] Phone: [phone]    Assessment and Plan:     1. Pes planus of both feet 2. Bilateral foot pain 3. Polyarthralgia 4. Chronic pain of both knees -Acute on chronic, worsening, subsequent sports medicine visit - Patient has significant bilateral foot and knee pain that are affecting her day-to-day life. - As patient's pain started in her feet and then progressed to knees, we will focus our treatment on her feet with patient to get updated inserts.  She will try over-the-counter inserts first and we could prescribed custom inserts as needed - Patient received benefit from a prednisone Dosepak in the past, but due to side effects of prolonged oral steroid, patient elects to have IM methylprednisone/Depo-Medrol injection given today in clinic - Start physical activity for knees and feet including water aerobics, elliptical, or stationary bike - Continue previously prescribed meloxicam - Patient to bring in x-rays of bilateral knees - Of note patient has failed to improve with CSI and HA injections to these intra-articular - Patient to contact and start physical therapy - methylPREDNISolone acetate (DEPO-MEDROL) injection 80 mg - ketorolac (TORADOL) injection 60 mg   5. Trigger finger of right thumb -Acute and uncomplicated, initial sports medicine visit - Patient elects to trial conservative therapy including wrist brace including thumb which she will buy over-the-counter, topical Voltaren gel - If no improvement or worsening of symptoms could consider CSI for trigger thumb and follow-up visit   Pertinent previous records reviewed include previous office note, foot x-ray, ankle x-ray   Follow Up: In 3 weeks.  Review x-rays of knees at that time which patient should bring in.  Discussed triggering of right thumb and potential CSI.    Subjective:    I, Wilford Grist, am serving as a  scribe for Dr. Aleen Sells  Chief Complaint: Hand pain, knee and ankle pain  HPI: 50 year old female presenting with hand pain. Pain throughout entire hand especially at night occurring for past 3 weeks. Thumb on R hand triggers.   B knee pain and had Gelsyn 6 weeks ago. Patient has had no improvement following these injections. R>L. Pain is constant and worse when she is weight bearing. Patient has been getting cramps in back of leg.   Pain in both ankles that is constant. Has been dealing with pain in foot for many years.   Patient says that she feels her pain all started in her feet and then gradually transition to bilateral knee pain.  Knee pain is described as mostly anterior knee pain.  Denies numbness/tingling/weakness in extremities, saddle paresthesias, urinary incontinence.  She has used prednisone Dosepaks in the past which were beneficial.  Her pain in her bilateral feet and knees are limiting her day-to-day activities and are causing her significant mental distress.   Relevant Historical Information: Chronic bilateral feet and knee pain  Additional pertinent review of systems negative.   Current Outpatient Medications:    desloratadine (CLARINEX) 5 MG tablet, Take 1 tablet (5 mg total) by mouth daily., Disp: 90 tablet, Rfl: 3   DULoxetine (CYMBALTA) 20 MG capsule, TAKE 1 CAPSULE BY MOUTH EVERY DAY, Disp: 30 capsule, Rfl: 3   fluticasone (FLONASE) 50 MCG/ACT nasal spray, Place 2 sprays into both nostrils daily., Disp: 16 g, Rfl: 6   fluticasone-salmeterol (ADVAIR HFA) 115-21 MCG/ACT inhaler, Inhale 2 puffs into the lungs 2 (two) times daily., Disp: , Rfl:    meloxicam (  MOBIC) 15 MG tablet, TAKE 1 TABLET (15 MG TOTAL) BY MOUTH DAILY., Disp: 30 tablet, Rfl: 0   mometasone (NASONEX) 50 MCG/ACT nasal spray, Place 2 sprays into the nose daily., Disp: 1 each, Rfl: 12   montelukast (SINGULAIR) 10 MG tablet, Take 1 tablet (10 mg total) by mouth at bedtime., Disp: 90 tablet, Rfl: 3    pseudoephedrine (SUDAFED) 60 MG tablet, Take 1 tablet (60 mg total) by mouth every 6 (six) hours as needed for congestion., Disp: 30 tablet, Rfl: 0   Objective:     Vitals:   12/13/20 1143  BP: (!) 130/98  Pulse: (!) 105  SpO2: 94%  Weight: (!) 302 lb (137 kg)  Height: 5\' 4"  (1.626 m)      Body mass index is 51.84 kg/m.    Physical Exam:    General: Appears well, nad, nontoxic and pleasant Neuro:sensation intact, strength is 5/5 with df/pf/inv/ev, muscle tone wnl Skin:no susupicious lesions or rashes  Right hand:  No deformity or swelling appreciated. Wrist ROM  Ext 90, flexion70, radial/ulnar deviation 30 TTP significantly at nodule at base of thumb with triggering present nttp over the snauff box, dorsal carpals, volar carpals, radial styloid, ulnar styloid, 1st mcp, tfcc   Bilateral foot and ankle:  Bilateral pes planus No swelling or effusion TTP midfoot NTTP over fibular head, lat mal, medial mal, achilles, navicular, base of 5th, ATFL, CFL, deltoid, calcaneous  ROM DF 30, PF 45, inv/ev intact Negative ant drawer, talar tilt, rotation test, squeeze test. Neg thompson No pain with resisted inversion or eversion   Gait: Antalgic gait with increased valgus knee angling bilaterally  Electronically signed by:  D.[facility] 1:00 PM 12/13/20

## 2020-12-25 ENCOUNTER — Ambulatory Visit: Payer: 59 | Admitting: Physical Therapy

## 2020-12-30 ENCOUNTER — Ambulatory Visit: Payer: BC Managed Care – PPO | Admitting: Sports Medicine

## 2020-12-30 ENCOUNTER — Ambulatory Visit: Payer: 59 | Admitting: Family Medicine

## 2020-12-30 NOTE — Progress Notes (Deleted)
    Aleen Sells D.[name] [address] [place] [address] Phone: [phone]   Assessment and Plan:     There are no diagnoses linked to this encounter.  ***   Pertinent previous records reviewed include ***   Follow Up: ***     Subjective:   I, Debbe Odea, am serving as a scribe for Dr. Richardean Sale  Chief Complaint: Hand pain, knee pain, and ankle pain   HPI:   12/13/20 50 year old female presenting with hand pain. Pain throughout entire hand especially at night occurring for past 3 weeks. Thumb on R hand triggers.    B knee pain and had Gelsyn 6 weeks ago. Patient has had no improvement following these injections. R>L. Pain is constant and worse when she is weight bearing. Patient has been getting cramps in back of leg.    Pain in both ankles that is constant. Has been dealing with pain in foot for many years.    Patient says that she feels her pain all started in her feet and then gradually transition to bilateral knee pain.  Knee pain is described as mostly anterior knee pain.  Denies numbness/tingling/weakness in extremities, saddle paresthesias, urinary incontinence.  She has used prednisone Dosepaks in the past which were beneficial.  Her pain in her bilateral feet and knees are limiting her day-to-day activities and are causing her significant mental distress.  12/30/20 Patient states   Relevant Historical Information: ***  Additional pertinent review of systems negative.   Current Outpatient Medications:    desloratadine (CLARINEX) 5 MG tablet, Take 1 tablet (5 mg total) by mouth daily., Disp: 90 tablet, Rfl: 3   DULoxetine (CYMBALTA) 20 MG capsule, TAKE 1 CAPSULE BY MOUTH EVERY DAY, Disp: 30 capsule, Rfl: 3   fluticasone (FLONASE) 50 MCG/ACT nasal spray, Place 2 sprays into both nostrils daily., Disp: 16 g, Rfl: 6   fluticasone-salmeterol (ADVAIR HFA) 115-21 MCG/ACT inhaler, Inhale 2 puffs into the lungs 2  (two) times daily., Disp: , Rfl:    meloxicam (MOBIC) 15 MG tablet, TAKE 1 TABLET (15 MG TOTAL) BY MOUTH DAILY., Disp: 30 tablet, Rfl: 0   mometasone (NASONEX) 50 MCG/ACT nasal spray, Place 2 sprays into the nose daily., Disp: 1 each, Rfl: 12   montelukast (SINGULAIR) 10 MG tablet, Take 1 tablet (10 mg total) by mouth at bedtime., Disp: 90 tablet, Rfl: 3   pseudoephedrine (SUDAFED) 60 MG tablet, Take 1 tablet (60 mg total) by mouth every 6 (six) hours as needed for congestion., Disp: 30 tablet, Rfl: 0   Objective:     There were no vitals filed for this visit.    There is no height or weight on file to calculate BMI.    Physical Exam:    ***   Electronically signed by:  Aleen Sells D.[name] 12:08 PM 12/30/20

## 2021-01-04 ENCOUNTER — Other Ambulatory Visit: Payer: Self-pay | Admitting: Nurse Practitioner

## 2021-01-04 DIAGNOSIS — R413 Other amnesia: Secondary | ICD-10-CM

## 2021-01-04 DIAGNOSIS — F411 Generalized anxiety disorder: Secondary | ICD-10-CM

## 2021-01-06 ENCOUNTER — Other Ambulatory Visit: Payer: Self-pay | Admitting: Podiatry

## 2021-01-20 ENCOUNTER — Ambulatory Visit (INDEPENDENT_AMBULATORY_CARE_PROVIDER_SITE_OTHER)
Admission: RE | Admit: 2021-01-20 | Discharge: 2021-01-20 | Disposition: A | Discharge status: Home / Self Care | Payer: BC Managed Care – PPO | Source: Ambulatory Visit | Attending: Nurse Practitioner | Admitting: Nurse Practitioner

## 2021-01-20 ENCOUNTER — Encounter: Payer: Self-pay | Admitting: Nurse Practitioner

## 2021-01-20 ENCOUNTER — Other Ambulatory Visit: Payer: Self-pay

## 2021-01-20 ENCOUNTER — Ambulatory Visit: Payer: BC Managed Care – PPO | Admitting: Nurse Practitioner

## 2021-01-20 VITALS — BP 136/92 | HR 89 | Temp 97.9°F | Resp 16 | Ht 64.0 in

## 2021-01-20 DIAGNOSIS — J4520 Mild intermittent asthma, uncomplicated: Secondary | ICD-10-CM

## 2021-01-20 DIAGNOSIS — R0602 Shortness of breath: Secondary | ICD-10-CM

## 2021-01-20 DIAGNOSIS — R059 Cough, unspecified: Secondary | ICD-10-CM | POA: Diagnosis not present

## 2021-01-20 DIAGNOSIS — R600 Localized edema: Secondary | ICD-10-CM | POA: Diagnosis not present

## 2021-01-20 DIAGNOSIS — J45909 Unspecified asthma, uncomplicated: Secondary | ICD-10-CM | POA: Diagnosis not present

## 2021-01-20 DIAGNOSIS — R0609 Other forms of dyspnea: Secondary | ICD-10-CM | POA: Insufficient documentation

## 2021-01-20 LAB — BASIC METABOLIC PANEL
BUN: 13 mg/dL (ref 6–23)
CO2: 30 mEq/L (ref 19–32)
Calcium: 9.1 mg/dL (ref 8.4–10.5)
Chloride: 104 mEq/L (ref 96–112)
Creatinine, Ser: 0.81 mg/dL (ref 0.40–1.20)
GFR: 83.26 mL/min (ref >60.00)
Glucose, Bld: 93 mg/dL (ref 70–99)
Potassium: 3.4 mEq/L — ABNORMAL LOW (ref 3.5–5.1)
Sodium: 141 mEq/L (ref 135–145)

## 2021-01-20 LAB — CBC WITH DIFFERENTIAL/PLATELET
Basophils Absolute: 0 10*3/uL (ref 0.0–0.1)
Basophils Relative: 0.4 % (ref 0.0–3.0)
Eosinophils Absolute: 0.1 10*3/uL (ref 0.0–0.7)
Eosinophils Relative: 1.9 % (ref 0.0–5.0)
HCT: 37.9 % (ref 36.0–46.0)
Hemoglobin: 12.1 g/dL (ref 12.0–15.0)
Lymphocytes Relative: 32 % (ref 12.0–46.0)
Lymphs Abs: 2.1 10*3/uL (ref 0.7–4.0)
MCHC: 32 g/dL (ref 30.0–36.0)
MCV: 83.3 fl (ref 78.0–100.0)
Monocytes Absolute: 0.5 10*3/uL (ref 0.1–1.0)
Monocytes Relative: 7.8 % (ref 3.0–12.0)
Neutro Abs: 3.9 10*3/uL (ref 1.4–7.7)
Neutrophils Relative %: 57.9 % (ref 43.0–77.0)
Platelets: 286 10*3/uL (ref 150.0–400.0)
RBC: 4.55 Mil/uL (ref 3.87–5.11)
RDW: 16.4 % — ABNORMAL HIGH (ref 11.5–15.5)
WBC: 6.7 10*3/uL (ref 4.0–10.5)

## 2021-01-20 LAB — BRAIN NATRIURETIC PEPTIDE: Pro B Natriuretic peptide (BNP): 33 pg/mL (ref 0.0–100.0)

## 2021-01-20 MED ORDER — PREDNISONE 20 MG PO TABS: ORAL_TABLET | ORAL | 0 refills | Status: AC

## 2021-01-20 MED ORDER — FLUTICASONE-SALMETEROL 115-21 MCG/ACT IN AERO: 2.0000 | INHALATION_SPRAY | Freq: Two times a day (BID) | RESPIRATORY_TRACT | 0 refills | Status: DC

## 2021-01-20 NOTE — Progress Notes (Signed)
Acute Office Visit  Subjective:    Patient ID: Veronica [name], female    DOB: 1970/04/05, 50 y.o.   MRN: [id number]  Chief Complaint  Patient presents with   Cough    X 5 to 6 weeks. Has asthma, started with head congestion, nasal congestion, sore throat, and now has settled in her chest. Negative covid test about 2 to 3 weeks ago.     Patient is in today for cough  Started out with sinus infection symptoms per patient report. But now it has settled into her chest. SHOB, hx of asthma does have Albuterol and advair. States that she has been using the albuterol inhaler multiple times a daily and at night.  Recently ran out of her advair Having to sleep with head of bed elevated. States that she will wake up with a wheeze then cough and then feel SHOB.   Past Medical History:  Diagnosis Date   Asthma    GERD (gastroesophageal reflux disease)     Past Surgical History:  Procedure Laterality Date   ABDOMINAL HYSTERECTOMY     CHOLECYSTECTOMY     LAPAROSCOPIC GASTRIC BANDING      Family History  Problem Relation Age of Onset   Diabetes Mother    Hypertension Mother    Diabetes Father    Hypertension Father     Social History   Socioeconomic History   Marital status: Married    Spouse name: Not on file   Number of children: Not on file   Years of education: Not on file   Highest education level: Not on file  Occupational History   Not on file  Tobacco Use   Smoking status: Never   Smokeless tobacco: Never  Vaping Use   Vaping Use: Never used  Substance and Sexual Activity   Alcohol use: Not Currently   Drug use: Never   Sexual activity: Yes  Other Topics Concern   Not on file  Social History Narrative   Not on file   Social Determinants of Health   Financial Resource Strain: Not on file  Food Insecurity: Not on file  Transportation Needs: Not on file  Physical Activity: Not on file  Stress: Not on file  Social Connections: Not on file  Intimate  Partner Violence: Not on file    Outpatient Medications Prior to Visit  Medication Sig Dispense Refill   desloratadine (CLARINEX) 5 MG tablet Take 1 tablet (5 mg total) by mouth daily. 90 tablet 3   DULoxetine (CYMBALTA) 20 MG capsule TAKE 1 CAPSULE BY MOUTH EVERY DAY 30 capsule 3   fluticasone (FLONASE) 50 MCG/ACT nasal spray Place 2 sprays into both nostrils daily. 16 g 6   fluticasone-salmeterol (ADVAIR HFA) 115-21 MCG/ACT inhaler Inhale 2 puffs into the lungs 2 (two) times daily.     mometasone (NASONEX) 50 MCG/ACT nasal spray Place 2 sprays into the nose daily. 1 each 12   montelukast (SINGULAIR) 10 MG tablet Take 1 tablet (10 mg total) by mouth at bedtime. 90 tablet 3   meloxicam (MOBIC) 15 MG tablet TAKE 1 TABLET (15 MG TOTAL) BY MOUTH DAILY. (Patient not taking: Reported on 01/20/2021) 30 tablet 0   pseudoephedrine (SUDAFED) 60 MG tablet Take 1 tablet (60 mg total) by mouth every 6 (six) hours as needed for congestion. (Patient not taking: Reported on 01/20/2021) 30 tablet 0   No facility-administered medications prior to visit.    Allergies  Allergen Reactions   Peanut-Containing Drug Products  Percocet [Oxycodone-Acetaminophen]    Shellfish Allergy     Review of Systems  Constitutional:  Negative for chills and fever.  Respiratory:  Positive for cough (yellowish in nature and small amounts and thick) and shortness of breath.   Cardiovascular:  Negative for chest pain.  Gastrointestinal:  Negative for diarrhea, nausea and vomiting.  Neurological:  Positive for headaches.      Objective:    Physical Exam Vitals and nursing note reviewed.  Constitutional:      Appearance: She is obese.  HENT:     Right Ear: Tympanic membrane, ear canal and external ear normal. There is no impacted cerumen.     Left Ear: Tympanic membrane, ear canal and external ear normal. There is no impacted cerumen.     Mouth/Throat:     Mouth: Mucous membranes are moist.  Cardiovascular:      Rate and Rhythm: Normal rate and regular rhythm.     Pulses: Normal pulses.  Pulmonary:     Effort: Pulmonary effort is normal.     Breath sounds: Rhonchi (cleared with coughing) present.  Abdominal:     General: Bowel sounds are normal.  Musculoskeletal:     Right lower leg: 1+ Pitting Edema present.     Left lower leg: 1+ Pitting Edema present.     Comments: Calves supple and non tender on exam  Lymphadenopathy:     Cervical: No cervical adenopathy.  Neurological:     General: No focal deficit present.     Mental Status: She is alert.  Psychiatric:        Mood and Affect: Mood normal.        Behavior: Behavior normal.        Thought Content: Thought content normal.        Judgment: Judgment normal.    LMP 06/15/2010  Wt Readings from Last 3 Encounters:  12/13/20 (!) 302 lb (137 kg)  11/18/20 (!) 302 lb 9.6 oz (137.3 kg)  10/01/20 (!) 303 lb 3.2 oz (137.5 kg)    Health Maintenance Due  Topic Date Due   Pneumococcal Vaccine 45-70 Years old (1 - PCV) Never done   HIV Screening  Never done   Hepatitis C Screening  Never done   TETANUS/TDAP  Never done   PAP SMEAR-Modifier  Never done   COLONOSCOPY (Pts 45-9yrs Insurance coverage will need to be confirmed)  Never done   MAMMOGRAM  04/15/2018   Zoster Vaccines- Shingrix (1 of 2) Never done   COVID-19 Vaccine (4 - Booster for Moderna series) 06/01/2020   INFLUENZA VACCINE  Never done    There are no preventive care reminders to display for this patient.   Lab Results  Component Value Date   TSH 1.99 04/09/2020   Lab Results  Component Value Date   WBC 5.4 04/09/2020   HGB 12.7 04/09/2020   HCT 39.3 04/09/2020   MCV 79.5 04/09/2020   PLT 260.0 04/09/2020   Lab Results  Component Value Date   NA 141 04/09/2020   K 3.6 04/09/2020   CO2 30 04/09/2020   GLUCOSE 88 04/09/2020   BUN 15 04/09/2020   CREATININE 1.07 04/09/2020   BILITOT 0.2 04/09/2020   ALKPHOS 98 04/09/2020   AST 19 04/09/2020   ALT 16  04/09/2020   PROT 7.2 04/09/2020   ALBUMIN 4.0 04/09/2020   CALCIUM 9.4 04/09/2020   GFR 59.94 (L) 04/09/2020   No results found for: CHOL No results found for: HDL No results  found for: LDLCALC No results found for: TRIG No results found for: CHOLHDL No results found for: WUJW1X     Assessment & Plan:   Problem List Items Addressed This Visit       Respiratory   Asthma    History of the same.  Currently maintained on Advair and albuterol.  Patiently recently ran out of Advair we will refill for her today.  Has been using albuterol inhaler and increased dosing in demand.  This likely could be an asthma exacerbation brought on by a viral illness previously.  We will start patient on prednisone taper and see how she does pending stat chest x-ray continue to monitor.  Continue using Advair as prescribed and albuterol as needed      Relevant Medications   fluticasone-salmeterol (ADVAIR HFA) 115-21 MCG/ACT inhaler   predniSONE (DELTASONE) 20 MG tablet     Other   Shortness of breath - Primary    See above.  Patient has not recently done any long travel has never had history of DVT or PE.  Low likelihood that cough and shortness of breath is from PE.  Likely asthma exacerbation as before      Relevant Orders   CBC with Differential/Platelet   Basic metabolic panel   Brain natriuretic peptide   DG Chest 2 View (Completed)   Localized edema    Does have bilateral 1+ pitting edema.  We will check basic labs pending lab results.  Continue to monitor      Relevant Orders   Brain natriuretic peptide     No orders of the defined types were placed in this encounter.  This visit occurred during the SARS-CoV-2 public health emergency.  Safety protocols were in place, including screening questions prior to the visit, additional usage of staff PPE, and extensive cleaning of exam room while observing appropriate contact time as indicated for disinfecting solutions.   Audria Nine,  NP

## 2021-01-20 NOTE — Assessment & Plan Note (Signed)
History of the same.  Currently maintained on Advair and albuterol.  Patiently recently ran out of Advair we will refill for her today.  Has been using albuterol inhaler and increased dosing in demand.  This likely could be an asthma exacerbation brought on by a viral illness previously.  We will start patient on prednisone taper and see how she does pending stat chest x-ray continue to monitor.  Continue using Advair as prescribed and albuterol as needed

## 2021-01-20 NOTE — Patient Instructions (Signed)
Nice to see you Veronica [name] follow up with the Chest xray

## 2021-01-20 NOTE — Assessment & Plan Note (Signed)
See above.  Patient has not recently done any long travel has never had history of DVT or PE.  Low likelihood that cough and shortness of breath is from PE.  Likely asthma exacerbation as before

## 2021-01-20 NOTE — Assessment & Plan Note (Signed)
Does have bilateral 1+ pitting edema.  We will check basic labs pending lab results.  Continue to monitor

## 2021-01-21 ENCOUNTER — Telehealth: Payer: Self-pay | Admitting: Nurse Practitioner

## 2021-01-21 NOTE — Telephone Encounter (Signed)
Called [facility], Advair HFA with insurance cost is $115, Advair Diskus -closest strength to the [facility] would be 100-50 per pharmacist, cost is $35. Please review if it is ok to switch?

## 2021-01-21 NOTE — Telephone Encounter (Signed)
Spoke with patient. Patient states Claris Gower did not prescribe the inhaler to her before. Patient originally got Advair inhalers from [facility] [place] and came [place] in summer 2021 and had a lot of the inhalers with her thatt she brought and it lasted this long. She has been using Advair diskus 250-50 2 puffs daily (has been on this for the past 16 or 17 years) not HFA kind. So she states she would not be switching therapy, she thinks the wrong inhaler was added originally. Patient ran out of the inhaler a little over a week ago. Advised patient that I will send this message to [facility] and Claris Gower and that Susy Frizzle was out of the office this afternoon.

## 2021-01-21 NOTE — Telephone Encounter (Signed)
Veronica [name] called in and stated that she was seen yesterday by [facility] and he had refill her Advair but instead of the disk it was the spray. And when she went to get the pharmacy they told her it was not covered under her insurance and it would be 150$, and they would need to call and change it to the disk. And [facility] [address]

## 2021-01-21 NOTE — Telephone Encounter (Signed)
I am going to reach out to her PCP to see if she is ok with that change. How long has she been out of the Adviar?  [place] patient was on [facility] but Diskcus is much cheaper. You had her on a total dose of 470mcg-84mcg with HFA (the script is 115-21 2 puffs twice daily) If we change her it would be a total of 527mcg-100mcg ( 221mcg-50mcg 1 puff twice daily)

## 2021-01-22 ENCOUNTER — Other Ambulatory Visit: Payer: Self-pay | Admitting: Nurse Practitioner

## 2021-01-22 ENCOUNTER — Ambulatory Visit: Payer: Self-pay | Admitting: Rehabilitative and Restorative Service Providers"

## 2021-01-22 MED ORDER — FLUTICASONE-SALMETEROL 250-50 MCG/ACT IN AEPB: 1.0000 | INHALATION_SPRAY | Freq: Two times a day (BID) | RESPIRATORY_TRACT | 2 refills | Status: DC

## 2021-01-22 NOTE — Telephone Encounter (Signed)
RX sent in and message sent to [facility] scheduler to set patient up for appointment.  Patient advised that RX sent in and that she will get a call about scheduling physical.

## 2021-01-22 NOTE — Telephone Encounter (Signed)
Before calling I wanted to verify the dose. Patient states she has been using 250-50 but the response back said 100-50. Ok to change to 250-50?

## 2021-01-23 ENCOUNTER — Other Ambulatory Visit: Payer: Self-pay

## 2021-01-23 ENCOUNTER — Encounter: Payer: Self-pay | Admitting: Rehabilitative and Restorative Service Providers"

## 2021-01-23 ENCOUNTER — Ambulatory Visit: Payer: BC Managed Care – PPO | Admitting: Rehabilitative and Restorative Service Providers"

## 2021-01-23 DIAGNOSIS — M25661 Stiffness of right knee, not elsewhere classified: Secondary | ICD-10-CM

## 2021-01-23 DIAGNOSIS — R600 Localized edema: Secondary | ICD-10-CM

## 2021-01-23 DIAGNOSIS — M25662 Stiffness of left knee, not elsewhere classified: Secondary | ICD-10-CM | POA: Diagnosis not present

## 2021-01-23 DIAGNOSIS — M6281 Muscle weakness (generalized): Secondary | ICD-10-CM

## 2021-01-23 DIAGNOSIS — R262 Difficulty in walking, not elsewhere classified: Secondary | ICD-10-CM

## 2021-01-23 DIAGNOSIS — M25571 Pain in right ankle and joints of right foot: Secondary | ICD-10-CM

## 2021-01-23 DIAGNOSIS — M25572 Pain in left ankle and joints of left foot: Secondary | ICD-10-CM

## 2021-01-23 NOTE — Therapy (Addendum)
[facility] [address] [place], [place], [address] Phone: [phone]   Fax:  [phone]  Physical Therapy Evaluation/DC  Patient Details  Name: Veronica [name] MRN: [id number] Date of Birth: 04-Aug-1970 Referring Provider (PT): Rodolph Bong MD  PHYSICAL THERAPY DISCHARGE SUMMARY  Visits from [facility] of Care: Today only  Current functional level related to goals / functional outcomes: Patient never returned after evaluation   Remaining deficits: See evaluation   Education / Equipment: Starter [name]   Patient agrees to discharge. Patient goals were  set . Patient is being discharged due to not returning since the last visit.   Encounter Date: 01/23/2021   PT End of Session - 01/23/21 1639     Visit Number 1    Number of Visits 12    Date for PT Re-Evaluation 04/17/21    PT Start Time 1521    PT Stop Time 1614    PT Time Calculation (min) 53 min    Activity Tolerance Patient tolerated treatment well    Behavior During Therapy WFL for tasks assessed/performed             Past Medical History:  Diagnosis Date   Asthma    GERD (gastroesophageal reflux disease)     Past Surgical History:  Procedure Laterality Date   ABDOMINAL HYSTERECTOMY     CHOLECYSTECTOMY     LAPAROSCOPIC GASTRIC BANDING      There were no vitals filed for this visit.    Subjective Assessment - 01/23/21 1631     Subjective Veronica [name] has chronic knee and foot pain.  She just bought a new house and she has been avoiding the stairs due to knee and foot pain.  She would like to be able to do stairs, stand and walk without pain to take advantage of the new house.    Pertinent History Asthma and morbid obesity    Limitations Walking;Lifting;House hold activities;Standing    How long can you stand comfortably? 5 minutes or less    How long can you walk comfortably? 5 minutes or less    Patient Stated Goals Be able to go up and down stairs at home without pain     Currently in Pain? Yes    Pain Score 4     Pain Location Other (Comment)   B knees, feet and ankles   Pain Descriptors / Indicators Aching;Throbbing;Sore;Tiring    Pain Type Chronic pain    Pain Radiating Towards NA    Pain Onset More than a month ago    Pain Frequency Constant    Aggravating Factors  Standing and walking    Pain Relieving Factors Rest    Effect of Pain on Daily Activities Unable to go up/down stairs at home    Multiple Pain Sites No                [facility] PT Assessment - 01/23/21 0001       Assessment   Medical Diagnosis B knee and foot pain    Referring Provider (PT) Rodolph Bong MD    Onset Date/Surgical Date --   Chronic     Precautions   Precautions None      Restrictions   Weight Bearing Restrictions No    Other Position/Activity Restrictions No      Balance Screen   Has the patient fallen in the past 6 months Yes    How many times? 1    Has the patient had a  decrease in activity level because of a fear of falling?  No    Is the patient reluctant to leave their home because of a fear of falling?  No      Home Environment   Living Environment Private residence    Additional Comments Stairs 62- avoids      Prior Function   Level of Independence Independent    Vocation Full time employment    Buyer, retail    Leisure Keep up the house      Cognition   Overall Cognitive Status Within Functional Limits for tasks assessed      Observation/Other Assessments   Focus on Therapeutic Outcomes (FOTO)  43 (Goal 53)      ROM / Strength   AROM / PROM / Strength AROM;Strength      AROM   Overall AROM  Deficits    AROM Assessment Site Knee;Ankle    Right/Left Knee Left;Right    Right Knee Extension 0    Right Knee Flexion 100    Left Knee Extension 0    Left Knee Flexion 105    Right/Left Ankle Left;Right    Right Ankle Dorsiflexion 5    Left Ankle Dorsiflexion 5      Strength   Overall Strength Deficits    Strength  Assessment Site Ankle;Knee    Right/Left Knee Left;Right    Right Knee Extension --   49.6 pounds   Left Knee Extension --   38.3 pounds   Right/Left Ankle Left;Right    Right Ankle Inversion --   16.3 pounds   Right Ankle Eversion --   28.5 pounds   Left Ankle Inversion --   15.7 pounds   Left Ankle Eversion --   10.9 pounds                       Objective measurements completed on examination: See above findings.       [facility] Adult PT Treatment/Exercise - 01/23/21 0001       Neuro Re-ed    Neuro Re-ed Details  Tandem balance 6X 30 seconds      Exercises   Exercises Knee/Hip      Knee/Hip Exercises: Standing   Heel Raises Both;10 reps;3 seconds;Limitations    Heel Raises Limitations slow eccentrics      Knee/Hip Exercises: Seated   Long Arc Quad Strengthening;Both;3 sets;5 reps;Limitations    Long Arc Quad Limitations Seated straight leg raises (toes back, press knee down, tighten thigh, pause 2 seconds, lift 6-8 inches, hold 3 seconds and slowly lower)                     PT Education - 01/23/21 1638     Education Details Reviewed exam findings and starter [name].    Person(s) Educated Patient    Methods Explanation;Demonstration;Tactile cues;Verbal cues;Handout    Comprehension Verbalized understanding;Tactile cues required;Need further instruction;Returned demonstration;Verbal cues required              PT Short Term Goals - 01/23/21 1650       PT SHORT TERM GOAL #1   Title Veronica [name] will report compliance with her day 1 [name].    Baseline Assigned today.    Time 4    Period Weeks    Status New    Target Date 02/20/21      PT SHORT TERM GOAL #2   Title Improve B ankle dorsiflexion to 10 degrees.  Baseline 5 degrees    Time 4    Period Weeks    Status New    Target Date 02/20/21      PT SHORT TERM GOAL #3   Title Improve B knee flexion AROM to 120 degrees.    Baseline 100-105 degrees    Time 4    Period Weeks    Status New     Target Date 02/20/21               PT Long Term Goals - 01/23/21 1652       PT LONG TERM GOAL #1   Title Improve FOTO to 53.    Baseline 43    Time 12    Period Weeks    Status New    Target Date 04/17/21      PT LONG TERM GOAL #2   Title Improve B ankle and knee pain to consistently < 4/10 on the Numeric Pain Rating Scale.    Baseline Can be 5+/10    Time 12    Period Weeks    Status On-going    Target Date 04/17/21      PT LONG TERM GOAL #3   Title Improve B ankle strength to 40+ pounds inversion and eversion.    Baseline 10-28 pounds    Time 12    Period Weeks    Status New    Target Date 04/17/21      PT LONG TERM GOAL #4   Title Improve B quadriceps strength to 80+ pounds.    Baseline 36-50 pounds    Time 12    Period Weeks    Status New    Target Date 04/17/21      PT LONG TERM GOAL #5   Title Veronica [name] will be independent with her long-term [name] at DC.    Baseline Assigned today.    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  Foot intrinsics and B ankle strength are poor as is quadriceps strength.  She was started on a general LE strengthening program which she chooses to complete on a mostly independent basis due to a $120 co-pay.  Her #1 goal is to be able to stand and walk and be able to go up and down stairs.  With adequate attendance and [name] compliance, her prognosis is good.    Personal Factors and Comorbidities Comorbidity 1    Comorbidities Obesity    Examination-Activity Limitations Stairs;Stand;Lift;Carry;Locomotion Level;Squat    Examination-Participation Restrictions Community Activity;Shop    Stability/Clinical Decision Making Stable/Uncomplicated    Clinical Decision Making Low    Rehab Potential Good    PT Frequency Other (comment)   Due to high co-pay, Veronica [name] for  feedback and progressions to her [name].  1X/week recommended.   PT Duration 12 weeks    PT Treatment/Interventions ADLs/Self Care Home Management;Electrical Stimulation;Cryotherapy;Stair training;Therapeutic activities;Neuromuscular re-education;Balance training;Therapeutic exercise;Patient/family education;Vasopneumatic Device    PT Next Visit Plan Review [name], progress ankle and LE (quadriceps, hip abductors) strength, practical stair work    PT Home Exercise Plan Access Code: [email or web address]    Consulted and Agree with Plan of Care Patient  Patient will benefit from skilled therapeutic intervention in order to improve the following deficits and impairments:  Abnormal gait, Decreased balance, Decreased endurance, Decreased range of motion, Decreased strength, Difficulty walking, Increased edema, Obesity, Pain  Visit Diagnosis: Difficulty walking  Muscle weakness (generalized)  Stiffness of left knee, not elsewhere classified  Stiffness of right knee, not elsewhere classified  Pain in left ankle and joints of left foot  Pain in right ankle and joints of right foot  Localized edema     Problem List Patient Active Problem List   Diagnosis Date Noted   Shortness of breath 01/20/2021   Localized edema 01/20/2021   OSA (obstructive sleep apnea) 06/13/2020   GAD (generalized anxiety disorder) 04/10/2020   Memory difficulty 04/10/2020   Asthma 04/09/2020   S/P hysterectomy 04/09/2020   Insomnia 04/09/2020    Cherlyn Cushing, PT, MPT 01/23/2021, 5:01 PM  [facility] [address] [place], [place], [address] Phone: [phone]   Fax:  [phone]  Name: Veronica [name] MRN: [id number] Date of Birth: 1970/06/15

## 2021-01-23 NOTE — Patient Instructions (Signed)
Access Code: [email or web address] URL: [email or web address] Date: 01/23/2021 Prepared by: Pauletta Browns  Exercises Small Range Straight Leg Raise - 1-2 x daily - 7 x weekly - 4-10 sets - 5 reps - 3 seconds hold Heel Raise - 5-10 x daily - 7 x weekly - 1 sets - 5-10 reps - 3 seconds hold Tandem Stance - 1 x daily - 7 x weekly - 1 sets - 20-30 reps - 20 seconds hold

## 2021-02-16 ENCOUNTER — Other Ambulatory Visit: Payer: Self-pay | Admitting: Podiatry

## 2021-02-20 ENCOUNTER — Encounter: Payer: BC Managed Care – PPO | Admitting: Rehabilitative and Restorative Service Providers"

## 2021-03-03 ENCOUNTER — Other Ambulatory Visit: Payer: Self-pay

## 2021-03-04 ENCOUNTER — Encounter: Payer: Self-pay | Admitting: Nurse Practitioner

## 2021-03-04 ENCOUNTER — Ambulatory Visit (INDEPENDENT_AMBULATORY_CARE_PROVIDER_SITE_OTHER): Payer: BC Managed Care – PPO | Admitting: Nurse Practitioner

## 2021-03-04 VITALS — BP 138/88 | HR 84 | Temp 97.0°F | Ht 64.0 in | Wt 307.6 lb

## 2021-03-04 DIAGNOSIS — Z90710 Acquired absence of both cervix and uterus: Secondary | ICD-10-CM

## 2021-03-04 DIAGNOSIS — M2550 Pain in unspecified joint: Secondary | ICD-10-CM

## 2021-03-04 DIAGNOSIS — Z13220 Encounter for screening for lipoid disorders: Secondary | ICD-10-CM | POA: Diagnosis not present

## 2021-03-04 DIAGNOSIS — F411 Generalized anxiety disorder: Secondary | ICD-10-CM | POA: Diagnosis not present

## 2021-03-04 DIAGNOSIS — Z86010 Personal history of colonic polyps: Secondary | ICD-10-CM | POA: Diagnosis not present

## 2021-03-04 DIAGNOSIS — Z23 Encounter for immunization: Secondary | ICD-10-CM

## 2021-03-04 DIAGNOSIS — Z136 Encounter for screening for cardiovascular disorders: Secondary | ICD-10-CM

## 2021-03-04 DIAGNOSIS — Z0001 Encounter for general adult medical examination with abnormal findings: Secondary | ICD-10-CM

## 2021-03-04 DIAGNOSIS — Z1231 Encounter for screening mammogram for malignant neoplasm of breast: Secondary | ICD-10-CM

## 2021-03-04 DIAGNOSIS — Z124 Encounter for screening for malignant neoplasm of cervix: Secondary | ICD-10-CM

## 2021-03-04 MED ORDER — DULOXETINE HCL 20 MG PO CPEP: ORAL_CAPSULE | ORAL | 1 refills | Status: DC

## 2021-03-04 NOTE — Assessment & Plan Note (Signed)
Last colonoscopy completed 85yrs ago while she lived in [place]. Entered referral to GI

## 2021-03-04 NOTE — Progress Notes (Signed)
Subjective:    Patient ID: Veronica [name], female    DOB: May 31, 1970, 50 y.o.   MRN: [id number]  Patient presents today for CPE and eval of chronic conditions  HPI Pain in joint involving multiple sites Onset 16month ago Bilateral knees, ankles and hands: some improvement with ibuprofen BID.  No relief with mobic. Followed by ortho Unable to continue outpt due to high copay.  Check ESR, CRP and RH-factor  History of colon polyps [place]  S/P hysterectomy s/p hysterectomy due to menorrhagia, cervix present with septum per patient. Entered referral to GYN  GAD (generalized anxiety disorder) Stable mood with cymbalta 2011mVision:up to date Dental:up to date Diet:regular Exercise:none Weight:  Wt Readings from Last 3 Encounters:  03/04/21 (!) 307 lb 9.6 oz (139.5 kg)  12/13/20 (!) 302 lb (137 kg)  11/18/20 (!) 302 lb 9.6 oz (137.3 kg)    Sexual History (orientation,birth control, marital status, STD):no need for STD screen. Reports hx of 2cervix, so needs referral to GYN, needs mammogram and colonoscopy  Depression/Suicide: [facility] screen [facility] 03/04/2021 03/04/2021 04/09/2020  Decreased Interest 0 1 1  Down, Depressed, Hopeless 0 0 2  PHQ - 2 Score 0 1 3  Altered sleeping 0 2 3  Tired, decreased energy 0 1 3  Change in appetite 0 2 0  Feeling bad or failure about yourself  0 0 2  Trouble concentrating 0 0 2  Moving slowly or fidgety/restless 0 0 0  Suicidal thoughts 0 0 0  PHQ-9 Score 0 6 13  Difficult doing work/chores - Not difficult at all Somewhat difficult   No flowsheet data found.  Immunizations: (TDAP, Hep C screen, Pneumovax, Influenza, zoster)  Health Maintenance  Topic Date Due   Pneumococcal Vaccination (1 - PCV) Never done   HIV Screening  Never done   Hepatitis C Screening: USPSTF Recommendation to screen - Ages 18-56-79.  Never done   Pap Smear  Never done    Colon Cancer Screening  Never done   Mammogram  04/15/2018   Zoster (Shingles) Vaccine (1 of 2) Never done   COVID-19 Vaccine (4 - Booster for Moderna series) 06/01/2020   Tetanus Vaccine  03/05/2031   Flu Shot  Completed   HPV Vaccine  Aged Out   Menveo vaccine (11-12y7yroses 1mon48montht, 86yrs48yrter, College 1dose) MenB vaccine (16-52yrs 63yrs 8month 53month Fall Risk: Fall Risk  03/04/2021 04/09/2020  Falls in the past year? 1 0  Number falls in past yr: 0 0  Injury with Fall? 1 0  Risk for fall due to : History of fall(s) No Fall Risks  Follow up Falls evaluation completed Falls evaluation completed   Medications and allergies reviewed with patient and updated if appropriate.  Patient Active Problem List   Diagnosis Date Noted   Pain in joint involving multiple sites 03/04/2021   History of colon polyps 03/04/2021   Shortness of breath 01/20/2021   Localized edema 01/20/2021   OSA (obstructive sleep apnea) 06/13/2020   GAD (generalized anxiety disorder) 04/10/2020   Memory difficulty 04/10/2020   Asthma 04/09/2020   S/P hysterectomy 04/09/2020   Insomnia 04/09/2020    Current Outpatient Medications on File Prior to Visit  Medication Sig Dispense Refill   desloratadine (CLARINEX) 5 MG tablet Take 1 tablet (5 mg total) by mouth daily. 90 tablet 3   fluticasone (FLONASE) 50 MCG/ACT nasal spray Place  2 sprays into both nostrils daily. 16 g 6   fluticasone-salmeterol (ADVAIR DISKUS) 250-50 MCG/ACT AEPB Inhale 1 puff into the lungs in the morning and at bedtime. Rinse mouth after each use 14 each 2   mometasone (NASONEX) 50 MCG/ACT nasal spray Place 2 sprays into the nose daily. 1 each 12   montelukast (SINGULAIR) 10 MG tablet Take 1 tablet (10 mg total) by mouth at bedtime. 90 tablet 3   No current facility-administered medications on file prior to visit.    Past Medical History:  Diagnosis Date   Asthma    GERD (gastroesophageal reflux disease)     Past  Surgical History:  Procedure Laterality Date   ABDOMINAL HYSTERECTOMY     CHOLECYSTECTOMY     LAPAROSCOPIC GASTRIC BANDING      Social History   Socioeconomic History   Marital status: Married    Spouse name: Not on file   Number of children: Not on file   Years of education: Not on file   Highest education level: Not on file  Occupational History   Not on file  Tobacco Use   Smoking status: Never   Smokeless tobacco: Never  Vaping Use   Vaping Use: Never used  Substance and Sexual Activity   Alcohol use: Not Currently   Drug use: Never   Sexual activity: Yes  Other Topics Concern   Not on file  Social History Narrative   Not on file   Social Determinants of Health   Financial Resource Strain: Not on file  Food Insecurity: Not on file  Transportation Needs: Not on file  Physical Activity: Not on file  Stress: Not on file  Social Connections: Not on file   Family History  Problem Relation Age of Onset   Diabetes Mother    Hypertension Mother    Diabetes Father    Hypertension Father        Review of Systems  Constitutional:  Negative for fever, malaise/fatigue and weight loss.  HENT:  Negative for congestion and sore throat.   Eyes:        Negative for visual changes  Respiratory:  Negative for cough and shortness of breath.   Cardiovascular:  Negative for chest pain, palpitations and leg swelling.  Gastrointestinal:  Negative for blood in stool, constipation, diarrhea and heartburn.  Genitourinary:  Negative for dysuria, frequency and urgency.  Musculoskeletal:  Positive for joint pain. Negative for falls and myalgias.  Skin:  Negative for rash.  Neurological:  Negative for dizziness, sensory change and headaches.  Endo/Heme/Allergies:  Does not bruise/bleed easily.  Psychiatric/Behavioral:  Negative for depression, substance abuse and suicidal ideas. The patient is not nervous/anxious.    Objective:   Vitals:   03/04/21 1339  BP: 138/88  Pulse: 84   Temp: (!) 97 F (36.1 C)  SpO2: 99%   Body mass index is 52.8 kg/m.  Physical Examination:  Physical Exam Vitals reviewed.  Constitutional:      General: She is not in acute distress.    Appearance: She is well-developed. She is obese.  HENT:     Right Ear: Tympanic membrane, ear canal and external ear normal.     Left Ear: Tympanic membrane, ear canal and external ear normal.  Eyes:     Extraocular Movements: Extraocular movements intact.     Conjunctiva/sclera: Conjunctivae normal.  Cardiovascular:     Rate and Rhythm: Normal rate and regular rhythm.     Pulses: Normal pulses.  Heart sounds: Normal heart sounds.  Pulmonary:     Effort: Pulmonary effort is normal. No respiratory distress.     Breath sounds: Normal breath sounds.  Chest:     Chest wall: No tenderness.  Abdominal:     General: Bowel sounds are normal.     Palpations: Abdomen is soft.  Musculoskeletal:        General: Tenderness present. No swelling. Normal range of motion.     Cervical back: Normal range of motion and neck supple.     Right lower leg: No edema.     Left lower leg: No edema.  Skin:    General: Skin is warm and dry.     Findings: No erythema or rash.  Neurological:     Mental Status: She is alert and oriented to person, place, and time.     Deep Tendon Reflexes: Reflexes are normal and symmetric.  Psychiatric:        Mood and Affect: Mood normal.        Behavior: Behavior normal.        Thought Content: Thought content normal.    ASSESSMENT and PLAN: This visit occurred during the SARS-CoV-2 public health emergency.  Safety protocols were in place, including screening questions prior to the visit, additional usage of staff PPE, and extensive cleaning of exam room while observing appropriate contact time as indicated for disinfecting solutions.   Geneen was seen today for annual exam.  Diagnoses and all orders for this visit:  Encounter for preventative adult health care exam  with abnormal findings -     Comprehensive metabolic panel; Future -     CBC with Differential/Platelet; Future -     Lipid panel; Future -     TSH; Future -     MM 3D SCREEN BREAST BILATERAL; Future  Pain in joint involving multiple sites -     Rheumatoid Factor; Future -     Sedimentation rate; Future -     C-reactive protein; Future  Encounter for lipid screening for cardiovascular disease -     Lipid panel; Future  Encounter for screening mammogram for malignant neoplasm of breast -     MM 3D SCREEN BREAST BILATERAL; Future  Encounter for Papanicolaou smear for cervical cancer screening -     Ambulatory referral to Gynecology  History of colon polyps -     Ambulatory referral to Gastroenterology  Need for diphtheria-tetanus-pertussis (Tdap) vaccine -     Tdap vaccine greater than or equal to 7yo IM  Flu vaccine need -     Flu Vaccine QUAD 6+ mos PF IM (Fluarix Quad PF)  S/P hysterectomy  GAD (generalized anxiety disorder) -     DULoxetine (CYMBALTA) 20 MG capsule; TAKE 1 CAPSULE BY MOUTH EVERY DAY     Problem List Items Addressed This Visit       Other   GAD (generalized anxiety disorder)    Stable mood with cymbalta 14m      Relevant Medications   DULoxetine (CYMBALTA) 20 MG capsule   History of colon polyps    [place]      Relevant Orders   Ambulatory referral to Gastroenterology   Pain in joint involving multiple sites    Onset 92m46monthgo Bilateral knees, ankles and hands: some improvement with ibuprofen BID.  No relief with mobic. Followed by ortho Unable to continue outpt due to high copay.  Check ESR, CRP and RH-factor      Relevant Orders   Rheumatoid Factor   Sedimentation rate   C-reactive protein   S/P hysterectomy    s/p hysterectomy due to menorrhagia, cervix present with septum per patient. Entered referral to GYN      Other Visit Diagnoses      Encounter for preventative adult health care exam with abnormal findings    -  Primary   Relevant Orders   Comprehensive metabolic panel   CBC with Differential/Platelet   Lipid panel   TSH   MM 3D SCREEN BREAST BILATERAL   Encounter for lipid screening for cardiovascular disease       Relevant Orders   Lipid panel   Encounter for screening mammogram for malignant neoplasm of breast       Relevant Orders   MM 3D SCREEN BREAST BILATERAL   Encounter for Papanicolaou smear for cervical cancer screening       Relevant Orders   Ambulatory referral to Gynecology   Need for diphtheria-tetanus-pertussis (Tdap) vaccine       Relevant Orders   Tdap vaccine greater than or equal to 7yo IM (Completed)   Flu vaccine need       Relevant Orders   Flu Vaccine QUAD 6+ mos PF IM (Fluarix Quad PF) (Completed)       Follow up: Return in about 1 year (around 03/04/2022) for CPE (fasting).  Wilfred Lacy, NP

## 2021-03-04 NOTE — Assessment & Plan Note (Signed)
s/p hysterectomy due to menorrhagia, cervix present with septum per patient. Entered referral to GYN

## 2021-03-04 NOTE — Patient Instructions (Addendum)
Schedule fasting lab appt. Need to be fasting 8hrs prior to blood draw.  You will be contacted to schedule appt with GI, GYN and for mammogram.  Use recumbent bike or water aerobics for exercise  Schedule nurse visit for shingrix and pneumovax 20  Calorie Counting for Weight Loss Calories are units of energy. Your body needs a certain number of calories from food to keep going throughout the day. When you eat or drink more calories than your body needs, your body stores the extra calories mostly as fat. When you eat or drink fewer calories than your body needs, your body burns fat to get the energy it needs. Calorie counting means keeping track of how many calories you eat and drink each day. Calorie counting can be helpful if you need to lose weight. If you eat fewer calories than your body needs, you should lose weight. Ask your health care provider what a healthy weight is for you. For calorie counting to work, you will need to eat the right number of calories each day to lose a healthy amount of weight per week. A dietitian can help you figure out how many calories you need in a day and will suggest ways to reach your calorie goal. A healthy amount of weight to lose each week is usually 1-2 lb (0.5-0.9 kg). This usually means that your daily calorie intake should be reduced by 500-750 calories. Eating 1,200-1,500 calories a day can help most women lose weight. Eating 1,500-1,800 calories a day can help most men lose weight. What do I need to know about calorie counting? Work with your health care provider or dietitian to determine how many calories you should get each day. To meet your daily calorie goal, you will need to: Find out how many calories are in each food that you would like to eat. Try to do this before you eat. Decide how much of the food you plan to eat. Keep a food log. Do this by writing down what you ate and how many calories it had. To successfully lose weight, it is  important to balance calorie counting with a healthy lifestyle that includes regular activity. Where do I find calorie information? The number of calories in a food can be found on a Nutrition Facts label. If a food does not have a Nutrition Facts label, try to look up the calories online or ask your dietitian for help. Remember that calories are listed per serving. If you choose to have more than one serving of a food, you will have to multiply the calories per serving by the number of servings you plan to eat. For example, the label on a package of bread might say that a serving size is 1 slice and that there are 90 calories in a serving. If you eat 1 slice, you will have eaten 90 calories. If you eat 2 slices, you will have eaten 180 calories. How do I keep a food log? After each time that you eat, record the following in your food log as soon as possible: What you ate. Be sure to include toppings, sauces, and other extras on the food. How much you ate. This can be measured in cups, ounces, or number of items. How many calories were in each food and drink. The total number of calories in the food you ate. Keep your food log near you, such as in a pocket-sized notebook or on an app or website on your mobile phone. Some programs will  calculate calories for you and show you how many calories you have left to meet your daily goal. What are some portion-control tips? Know how many calories are in a serving. This will help you know how many servings you can have of a certain food. Use a measuring cup to measure serving sizes. You could also try weighing out portions on a kitchen scale. With time, you will be able to estimate serving sizes for some foods. Take time to put servings of different foods on your favorite plates or in your favorite bowls and cups so you know what a serving looks like. Try not to eat straight from a food's packaging, such as from a bag or box. Eating straight from the package  makes it hard to see how much you are eating and can lead to overeating. Put the amount you would like to eat in a cup or on a plate to make sure you are eating the right portion. Use smaller plates, glasses, and bowls for smaller portions and to prevent overeating. Try not to multitask. For example, avoid watching TV or using your computer while eating. If it is time to eat, sit down at a table and enjoy your food. This will help you recognize when you are full. It will also help you be more mindful of what and how much you are eating. What are tips for following this plan? Reading food labels Check the calorie count compared with the serving size. The serving size may be smaller than what you are used to eating. Check the source of the calories. Try to choose foods that are high in protein, fiber, and vitamins, and low in saturated fat, trans fat, and sodium. Shopping Read nutrition labels while you shop. This will help you make healthy decisions about which foods to buy. Pay attention to nutrition labels for low-fat or fat-free foods. These foods sometimes have the same number of calories or more calories than the full-fat versions. They also often have added sugar, starch, or salt to make up for flavor that was removed with the fat. Make a grocery list of lower-calorie foods and stick to it. Cooking Try to cook your favorite foods in a healthier way. For example, try baking instead of frying. Use low-fat dairy products. Meal planning Use more fruits and vegetables. One-half of your plate should be fruits and vegetables. Include lean proteins, such as chicken, [place], and fish. Lifestyle Each week, aim to do one of the following: 150 minutes of moderate exercise, such as walking. 75 minutes of vigorous exercise, such as running. General information Know how many calories are in the foods you eat most often. This will help you calculate calorie counts faster. Find a way of tracking calories  that works for you. Get creative. Try different apps or programs if writing down calories does not work for you. What foods should I eat?  Eat nutritious foods. It is better to have a nutritious, high-calorie food, such as an avocado, than a food with few nutrients, such as a bag of potato chips. Use your calories on foods and drinks that will fill you up and will not leave you hungry soon after eating. Examples of foods that fill you up are nuts and nut butters, vegetables, lean proteins, and high-fiber foods such as whole grains. High-fiber foods are foods with more than 5 g of fiber per serving. Pay attention to calories in drinks. Low-calorie drinks include water and unsweetened drinks. The items listed above may  not be a complete list of foods and beverages you can eat. Contact a dietitian for more information. What foods should I limit? Limit foods or drinks that are not good sources of vitamins, minerals, or protein or that are high in unhealthy fats. These include: Candy. Other sweets. Sodas, specialty coffee drinks, alcohol, and juice. The items listed above may not be a complete list of foods and beverages you should avoid. Contact a dietitian for more information. How do I count calories when eating out? Pay attention to portions. Often, portions are much larger when eating out. Try these tips to keep portions smaller: Consider sharing a meal instead of getting your own. If you get your own meal, eat only half of it. Before you start eating, ask for a container and put half of your meal into it. When available, consider ordering smaller portions from the menu instead of full portions. Pay attention to your food and drink choices. Knowing the way food is cooked and what is included with the meal can help you eat fewer calories. If calories are listed on the menu, choose the lower-calorie options. Choose dishes that include vegetables, fruits, whole grains, low-fat dairy products, and  lean proteins. Choose items that are boiled, broiled, grilled, or steamed. Avoid items that are buttered, battered, fried, or served with cream sauce. Items labeled as crispy are usually fried, unless stated otherwise. Choose water, low-fat milk, unsweetened iced tea, or other drinks without added sugar. If you want an alcoholic beverage, choose a lower-calorie option, such as a glass of wine or light beer. Ask for dressings, sauces, and syrups on the side. These are usually high in calories, so you should limit the amount you eat. If you want a salad, choose a garden salad and ask for grilled meats. Avoid extra toppings such as bacon, cheese, or fried items. Ask for the dressing on the side, or ask for olive oil and vinegar or lemon to use as dressing. Estimate how many servings of a food you are given. Knowing serving sizes will help you be aware of how much food you are eating at restaurants. Where to find more information Centers for Disease Control and Prevention: FootballExhibition.com.br U.S. Department of Agriculture: WrestlingReporter.dk Summary Calorie counting means keeping track of how many calories you eat and drink each day. If you eat fewer calories than your body needs, you should lose weight. A healthy amount of weight to lose per week is usually 1-2 lb (0.5-0.9 kg). This usually means reducing your daily calorie intake by 500-750 calories. The number of calories in a food can be found on a Nutrition Facts label. If a food does not have a Nutrition Facts label, try to look up the calories online or ask your dietitian for help. Use smaller plates, glasses, and bowls for smaller portions and to prevent overeating. Use your calories on foods and drinks that will fill you up and not leave you hungry shortly after a meal. This information is not intended to replace advice given to you by your health care provider. Make sure you discuss any questions you have with your health care provider. Document Revised:  04/20/2019 Document Reviewed: 04/20/2019 Elsevier Patient Education  2022 Elsevier Inc.   How to Increase Your Level of Physical Activity Getting regular physical activity is important for your overall health and well-being. Most people do not get enough exercise. There are easy ways to increase your level of physical activity, even if you have not been very active  in the past or if you are just starting out. What are the benefits of physical activity? Physical activity has many short-term and long-term benefits. Being active on a regular basis can improve your physical and mental health as well as provide other benefits. Physical health benefits Helping you lose weight or maintain a healthy weight. Strengthening your muscles and bones. Reducing your risk of certain long-term (chronic) diseases, including heart disease, cancer, and diabetes. Being able to move around more easily and for longer periods of time without getting tired (increased endurance or stamina). Improving your ability to fight off illness (enhanced immunity). Being able to sleep better. Helping you stay healthy as you get older, including: Helping you stay mobile, or capable of walking and moving around. Preventing accidents, such as falls. Increasing life expectancy. Mental health benefits Boosting your mood and improving your self-esteem. Lowering your chance of having mental health problems, such as depression or anxiety. Helping you feel good about your body. Other benefits Finding new sources of fun and enjoyment. Meeting new people who share a common interest. Before you begin If you have a chronic illness or have not been active for a while, check with your health care provider about how to get started. Ask your health care provider what activities are safe for you. Start out slowly. Walking or doing some simple chair exercises is a good place to start, especially if you have not been active before or for a long  time. Set goals that you can work toward. Ask your health care provider how much exercise is best for you. In general, most adults should: Do moderate-intensity exercise for at least 150 minutes each week (30 minutes on most days of the week) or vigorous exercise for at least 75 minutes each week, or a combination of these. Moderate-intensity exercise can include walking at a quick pace, biking, yoga, water aerobics, or gardening. Vigorous exercise involves activities that take more effort, such as jogging or running, playing sports, swimming laps, or jumping rope. Do strength exercises on at least 2 days each week. This can include weight lifting, body weight exercises, and resistance-band exercises. How to be more physically active Make a plan  Try to find activities that you enjoy. You are more likely to commit to an exercise routine if it does not feel like a chore. If you have bone or joint problems, choose low-impact exercises, like walking or swimming. Use these tips for being successful with an exercise plan: Find a workout partner for accountability. Join a group or class, such as an aerobics class, cycling class, or sports team. Make family time active. Go for a walk, bike, or swim. Include a variety of exercises each week. Consider using a fitness tracker, such as a mobile phone app or a device worn like a watch, that will count the number of steps you take each day. Many people strive to reach 10,000 steps a day. Find ways to be active in your daily routines Besides your formal exercise plans, you can find ways to do physical activity during your daily routines, such as: Walking or biking to work or to the store. Taking the stairs instead of the elevator. Parking farther away from the door at work or at the store. Planning walking meetings. Walking around while you are on the phone. Where to find more information Centers for Disease Control and Prevention:  CampusCasting.com.pt President's Council on Fitness, Sports & Nutrition: www.fitness.gov ChooseMyPlate: http://www.harvey.com/ Contact a health care provider if: You have  headaches, muscle aches, or joint pain that is concerning. You feel dizzy or light-headed while exercising. You faint. You feel your heart skipping, racing, or fluttering. You have chest pain while exercising. Summary Exercise benefits your mind and body at any age, even if you are just starting out. If you have a chronic illness or have not been active for a while, check with your health care provider before increasing your physical activity. Choose activities that are safe and enjoyable for you. Ask your health care provider what activities are safe for you. Start slowly. Tell your health care provider if you have problems as you start to increase your activity level. This information is not intended to replace advice given to you by your health care provider. Make sure you discuss any questions you have with your health care provider. Document Revised: 07/05/2020 Document Reviewed: 07/05/2020 Elsevier Patient Education  2022 [employer].

## 2021-03-04 NOTE — Assessment & Plan Note (Signed)
Stable mood with cymbalta 20mg 

## 2021-03-04 NOTE — Assessment & Plan Note (Signed)
Onset 30months ago Bilateral knees, ankles and hands: some improvement with ibuprofen BID.  No relief with mobic. Followed by ortho Unable to continue outpt due to high copay.  Check ESR, CRP and RH-factor

## 2021-03-07 ENCOUNTER — Encounter: Payer: BC Managed Care – PPO | Admitting: Rehabilitative and Restorative Service Providers"

## 2021-03-10 ENCOUNTER — Other Ambulatory Visit: Payer: BC Managed Care – PPO

## 2021-03-11 ENCOUNTER — Other Ambulatory Visit (INDEPENDENT_AMBULATORY_CARE_PROVIDER_SITE_OTHER): Payer: BC Managed Care – PPO

## 2021-03-11 ENCOUNTER — Ambulatory Visit (INDEPENDENT_AMBULATORY_CARE_PROVIDER_SITE_OTHER): Payer: BC Managed Care – PPO

## 2021-03-11 ENCOUNTER — Other Ambulatory Visit: Payer: Self-pay

## 2021-03-11 DIAGNOSIS — Z13220 Encounter for screening for lipoid disorders: Secondary | ICD-10-CM

## 2021-03-11 DIAGNOSIS — Z0001 Encounter for general adult medical examination with abnormal findings: Secondary | ICD-10-CM

## 2021-03-11 DIAGNOSIS — Z136 Encounter for screening for cardiovascular disorders: Secondary | ICD-10-CM | POA: Diagnosis not present

## 2021-03-11 DIAGNOSIS — Z23 Encounter for immunization: Secondary | ICD-10-CM

## 2021-03-11 DIAGNOSIS — M2550 Pain in unspecified joint: Secondary | ICD-10-CM

## 2021-03-11 LAB — CBC WITH DIFFERENTIAL/PLATELET
Basophils Absolute: 0 10*3/uL (ref 0.0–0.1)
Basophils Relative: 0.5 % (ref 0.0–3.0)
Eosinophils Absolute: 0.1 10*3/uL (ref 0.0–0.7)
Eosinophils Relative: 1.6 % (ref 0.0–5.0)
HCT: 38 % (ref 36.0–46.0)
Hemoglobin: 12.4 g/dL (ref 12.0–15.0)
Lymphocytes Relative: 32 % (ref 12.0–46.0)
Lymphs Abs: 1.9 10*3/uL (ref 0.7–4.0)
MCHC: 32.5 g/dL (ref 30.0–36.0)
MCV: 83.4 fl (ref 78.0–100.0)
Monocytes Absolute: 0.5 10*3/uL (ref 0.1–1.0)
Monocytes Relative: 8.8 % (ref 3.0–12.0)
Neutro Abs: 3.4 10*3/uL (ref 1.4–7.7)
Neutrophils Relative %: 57.1 % (ref 43.0–77.0)
Platelets: 252 10*3/uL (ref 150.0–400.0)
RBC: 4.55 Mil/uL (ref 3.87–5.11)
RDW: 16 % — ABNORMAL HIGH (ref 11.5–15.5)
WBC: 5.9 10*3/uL (ref 4.0–10.5)

## 2021-03-11 LAB — COMPREHENSIVE METABOLIC PANEL
ALT: 13 U/L (ref 0–35)
AST: 17 U/L (ref 0–37)
Albumin: 3.8 g/dL (ref 3.5–5.2)
Alkaline Phosphatase: 93 U/L (ref 39–117)
BUN: 10 mg/dL (ref 6–23)
CO2: 31 mEq/L (ref 19–32)
Calcium: 9.5 mg/dL (ref 8.4–10.5)
Chloride: 101 mEq/L (ref 96–112)
Creatinine, Ser: 0.77 mg/dL (ref 0.40–1.20)
GFR: 88.39 mL/min (ref >60.00)
Glucose, Bld: 133 mg/dL — ABNORMAL HIGH (ref 70–99)
Potassium: 4 mEq/L (ref 3.5–5.1)
Sodium: 139 mEq/L (ref 135–145)
Total Bilirubin: 0.4 mg/dL (ref 0.2–1.2)
Total Protein: 7.5 g/dL (ref 6.0–8.3)

## 2021-03-11 LAB — LIPID PANEL
Cholesterol: 199 mg/dL (ref 0–200)
HDL: 59.2 mg/dL (ref >39.00)
LDL Cholesterol: 120 mg/dL — ABNORMAL HIGH (ref 0–99)
NonHDL: 139.72
Total CHOL/HDL Ratio: 3
Triglycerides: 97 mg/dL (ref 0.0–149.0)
VLDL: 19.4 mg/dL (ref 0.0–40.0)

## 2021-03-11 LAB — C-REACTIVE PROTEIN: CRP: 1.3 mg/dL (ref 0.5–20.0)

## 2021-03-11 LAB — TSH: TSH: 1.52 u[IU]/mL (ref 0.35–5.50)

## 2021-03-11 LAB — SEDIMENTATION RATE: Sed Rate: 88 mm/hr — ABNORMAL HIGH (ref 0–30)

## 2021-03-11 NOTE — Progress Notes (Signed)
Per the orders of Alysia Penna pt Is here for 2nd shingle shots, and Prevnar 20. Pt received Prevnar 20 in right deltoid, Shingles in left at 09:25 am.given by [place] CMA/CPT pt tolerated immunizations well.

## 2021-03-12 ENCOUNTER — Telehealth: Payer: Self-pay | Admitting: Nurse Practitioner

## 2021-03-12 LAB — RHEUMATOID FACTOR: Rheumatoid fact SerPl-aCnc: <14 IU/mL (ref <14)

## 2021-03-12 NOTE — Telephone Encounter (Signed)
Patient will be contacted once provider reviews and documents on lab results.

## 2021-03-12 NOTE — Telephone Encounter (Signed)
Please call and schedule patient for acute video visit appointment with any available provider.

## 2021-03-13 ENCOUNTER — Encounter: Payer: Self-pay | Admitting: Nurse Practitioner

## 2021-03-13 ENCOUNTER — Other Ambulatory Visit: Payer: Self-pay

## 2021-03-13 ENCOUNTER — Telehealth (INDEPENDENT_AMBULATORY_CARE_PROVIDER_SITE_OTHER): Payer: BC Managed Care – PPO | Admitting: Nurse Practitioner

## 2021-03-13 DIAGNOSIS — U071 COVID-19: Secondary | ICD-10-CM | POA: Diagnosis not present

## 2021-03-13 DIAGNOSIS — R0609 Other forms of dyspnea: Secondary | ICD-10-CM

## 2021-03-13 MED ORDER — PREDNISONE 20 MG PO TABS: 20.0000 mg | ORAL_TABLET | Freq: Every day | ORAL | 0 refills | Status: AC

## 2021-03-13 MED ORDER — MOLNUPIRAVIR EUA 200MG CAPSULE: 4.0000 | ORAL_CAPSULE | Freq: Two times a day (BID) | ORAL | 0 refills | Status: AC

## 2021-03-13 NOTE — Assessment & Plan Note (Signed)
Patient tested positive for COVID-19.  Did discuss antiviral treatment that it is EUA only.  After joint discussion at this time to pursue antiviral treatment.  Did discuss common side effects of this medication.  Did discuss guidelines quarantines in regard to [facility] recommendations with patient.  Also discussed signs and symptoms when to seek urgent or emergent health care. Start molnupiravir as soon as possible

## 2021-03-13 NOTE — Progress Notes (Signed)
Patient ID: Veronica Brighamashia J Berlinger, female    DOB: [birth year], 50 y.o.   MRN: [id number]  Virtual visit completed through [employer], a video enabled telemedicine application. Due to national recommendations of social distancing due to COVID-19, a virtual visit is felt to be most appropriate for this patient at this time. Reviewed limitations, risks, security and privacy concerns of performing a virtual visit and the availability of in person appointments. I also reviewed that there may be a patient responsible charge related to this service. The patient agreed to proceed.   Patient location: home Provider location[facility] [place] [facility] Persons participating in this virtual visit: patient, provider   If any vitals were documented, they were collected by patient at home unless specified below.    LMP 06/15/2010    CC: Covid 19 Subjective:   HPI: Veronica [name] is a 50 y.o. female presenting on 03/13/2021 for Covid Positive (On 03/12/21, sx started on 03/10/21-sneezing, post nasal drip, stuffy nose. 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 1 episode bloody phelgm  Used albuterol twice this morning  Relevant past medical, surgical, family and social history reviewed and updated as indicated. Interim medical history since our last visit reviewed. Allergies and medications reviewed and updated. Outpatient Medications Prior to Visit  Medication Sig Dispense Refill   albuterol (VENTOLIN HFA) 108 (90 Base) MCG/ACT inhaler Inhale into the lungs every 6 (six) hours as needed for wheezing or shortness of breath.     desloratadine (CLARINEX) 5 MG tablet Take 1 tablet (5 mg total) by mouth daily. 90 tablet 3   DULoxetine (CYMBALTA) 20 MG capsule TAKE 1 CAPSULE BY MOUTH EVERY DAY 90 capsule 1   fluticasone  (FLONASE) 50 MCG/ACT nasal spray Place 2 sprays into both nostrils daily. 16 g 6   fluticasone-salmeterol (ADVAIR DISKUS) 250-50 MCG/ACT AEPB Inhale 1 puff into the lungs in the morning and at bedtime. Rinse mouth after each use 14 each 2   montelukast (SINGULAIR) 10 MG tablet Take 1 tablet (10 mg total) by mouth at bedtime. 90 tablet 3   mometasone (NASONEX) 50 MCG/ACT nasal spray Place 2 sprays into the nose daily. 1 each 12   No facility-administered medications prior to visit.     Per HPI unless specifically indicated in ROS section below Review of Systems  Constitutional:  Positive for fatigue. Negative for chills and fever.  HENT:  Positive for congestion, sinus pressure and sore throat (was improved today). Negative for ear discharge and ear pain.   Respiratory:  Positive for cough (yellow) and shortness of breath (DOE).   Cardiovascular:  Negative for chest pain.  Gastrointestinal:  Negative for abdominal pain, diarrhea, nausea and vomiting.  Musculoskeletal:  Positive for myalgias. Negative for arthralgias.  Neurological:  Negative for dizziness, light-headedness and headaches.  Objective:  LMP 06/15/2010   Wt Readings from Last 3 Encounters:  03/04/21 (!) 307 lb 9.6 oz (139.5 kg)  12/13/20 (!) 302 lb (137 kg)  11/18/20 (!) 302 lb 9.6 oz (137.3 kg)       Physical exam: Gen: alert, NAD, not ill appearing Pulm: speaks in complete sentences without increased work of breathing Psych: normal mood, normal thought content      Results for orders placed or performed in visit on 03/11/21  C-reactive protein  Result  Value Ref Range   CRP 1.3 0.5 - 20.0 mg/dL  Sedimentation rate  Result Value Ref Range   Sed Rate 88 (H) 0 - 30 mm/hr  Rheumatoid Factor  Result Value Ref Range   Rhuematoid fact SerPl-aCnc <14 <14 IU/mL  TSH  Result Value Ref Range   TSH 1.52 0.35 - 5.50 uIU/mL  Lipid panel  Result Value Ref Range   Cholesterol 199 0 - 200 mg/dL   Triglycerides 95.0 0.0 -  149.0 mg/dL   HDL 93.26 >71.24 mg/dL   VLDL 58.0 0.0 - 99.8 mg/dL   LDL Cholesterol 338 (H) 0 - 99 mg/dL   Total CHOL/HDL Ratio 3    NonHDL 139.72   CBC with Differential/Platelet  Result Value Ref Range   WBC 5.9 4.0 - 10.5 K/uL   RBC 4.55 3.87 - 5.11 Mil/uL   Hemoglobin 12.4 12.0 - 15.0 g/dL   HCT 25.0 53.9 - 76.7 %   MCV 83.4 78.0 - 100.0 fl   MCHC 32.5 30.0 - 36.0 g/dL   RDW 34.1 (H) 93.7 - 90.2 %   Platelets 252.0 150.0 - 400.0 K/uL   Neutrophils Relative % 57.1 43.0 - 77.0 %   Lymphocytes Relative 32.0 12.0 - 46.0 %   Monocytes Relative 8.8 3.0 - 12.0 %   Eosinophils Relative 1.6 0.0 - 5.0 %   Basophils Relative 0.5 0.0 - 3.0 %   Neutro Abs 3.4 1.4 - 7.7 K/uL   Lymphs Abs 1.9 0.7 - 4.0 K/uL   Monocytes Absolute 0.5 0.1 - 1.0 K/uL   Eosinophils Absolute 0.1 0.0 - 0.7 K/uL   Basophils Absolute 0.0 0.0 - 0.1 K/uL  Comprehensive metabolic panel  Result Value Ref Range   Sodium 139 135 - 145 mEq/L   Potassium 4.0 3.5 - 5.1 mEq/L   Chloride 101 96 - 112 mEq/L   CO2 31 19 - 32 mEq/L   Glucose, Bld 133 (H) 70 - 99 mg/dL   BUN 10 6 - 23 mg/dL   Creatinine, Ser 4.09 0.40 - 1.20 mg/dL   Total Bilirubin 0.4 0.2 - 1.2 mg/dL   Alkaline Phosphatase 93 39 - 117 U/L   AST 17 0 - 37 U/L   ALT 13 0 - 35 U/L   Total Protein 7.5 6.0 - 8.3 g/dL   Albumin 3.8 3.5 - 5.2 g/dL   GFR 73.53 >29.92 mL/min   Calcium 9.5 8.4 - 10.5 mg/dL   Assessment & Plan:   Problem List Items Addressed This Visit       Other   Dyspnea on exertion    Patient having increased shortness of breath with illness and has been using her albuterol inhaler more than normal.  Given patient is asthmatic will give her prednisone for a few days.  Discussed this with patient continue to monitor seek urgent or emergent health care as needed.      Relevant Medications   predniSONE (DELTASONE) 20 MG tablet   COVID-19 - Primary    Patient tested positive for COVID-19.  Did discuss antiviral treatment that it is EUA  only.  After joint discussion at this time to pursue antiviral treatment.  Did discuss common side effects of this medication.  Did discuss guidelines quarantines in regard to [facility] recommendations with patient.  Also discussed signs and symptoms when to seek urgent or emergent health care. Start molnupiravir as soon as possible      Relevant Medications   molnupiravir EUA (LAGEVRIO) 200 mg CAPS capsule  No orders of the defined types were placed in this encounter.  No orders of the defined types were placed in this encounter.   I discussed the assessment and treatment plan with the patient. The patient was provided an opportunity to ask questions and all were answered. The patient agreed with the plan and demonstrated an understanding of the instructions. The patient was advised to call back or seek an in-person evaluation if the symptoms worsen or if the condition fails to improve as anticipated.  Follow up plan: No follow-ups on file.  Romilda Garret, NP

## 2021-03-13 NOTE — Assessment & Plan Note (Signed)
Patient having increased shortness of breath with illness and has been using her albuterol inhaler more than normal.  Given patient is asthmatic will give her prednisone for a few days.  Discussed this with patient continue to monitor seek urgent or emergent health care as needed.

## 2021-03-27 ENCOUNTER — Encounter: Payer: BC Managed Care – PPO | Admitting: Rehabilitative and Restorative Service Providers"

## 2021-03-28 ENCOUNTER — Ambulatory Visit
Admission: RE | Admit: 2021-03-28 | Discharge: 2021-03-28 | Disposition: A | Discharge status: Home / Self Care | Payer: BC Managed Care – PPO | Source: Ambulatory Visit | Attending: Nurse Practitioner | Admitting: Nurse Practitioner

## 2021-03-28 DIAGNOSIS — Z1231 Encounter for screening mammogram for malignant neoplasm of breast: Secondary | ICD-10-CM | POA: Diagnosis not present

## 2021-03-28 DIAGNOSIS — Z0001 Encounter for general adult medical examination with abnormal findings: Secondary | ICD-10-CM

## 2021-03-31 ENCOUNTER — Telehealth: Payer: Self-pay | Admitting: Nurse Practitioner

## 2021-03-31 NOTE — Telephone Encounter (Signed)
Pt had her mammogram on 03/28/20 and it showed---In the left breast, possible masses warrant further evaluation. She got this information through mychart this weekend, so she is very anxious. Please give pt a call at [phone].

## 2021-03-31 NOTE — Telephone Encounter (Signed)
Pt informed order has been placed for repeat mammogram and she can call imaging center to schedule appointment.

## 2021-04-01 ENCOUNTER — Other Ambulatory Visit: Payer: Self-pay | Admitting: Nurse Practitioner

## 2021-04-01 DIAGNOSIS — R928 Other abnormal and inconclusive findings on diagnostic imaging of breast: Secondary | ICD-10-CM

## 2021-04-02 NOTE — Progress Notes (Addendum)
Veronica [name] D.[name] [address] [place] [address] Phone: [phone]   Assessment and Plan:     1. Trigger finger of right thumb -Chronic with exacerbation, subsequent sports medicine visit - Continued triggering of right thumb, generally worsened and more severe over the past 1 to 2 weeks - Patient elects for CSI trigger finger.  Tolerated well per note below - DG Hand Complete Right; Future  Procedure: Flexor Tendon Sheath Injection (Trigger Finger) Side: Right 1st digit  Indication: Right first digit trigger finger  After PARQ discussed and consent was given verbally.  The site was cleaned with alcohol prep.  A steroid injection was performed under ultrasound guidance using 0.58ml of 1% lidocaine without epinephrine and 20 mg of Kenalog 40 with sterile technique.  This was well tolerated and resulted in  relief.  Needle was removed and dressing placed and post injection instructions were given including  a discussion of likely return of pain today after the anesthetic wears off (with the possibility of worsened pain) until the steroid starts to work in 3-5 days.   Pt was advised to call or return to clinic if these symptoms worsen or fail to improve as anticipated.  If not at least 50% better in 6 weeks would consider repeat injection.  2. Pes planus of both feet 3. Bilateral foot pain -Chronic, stable, subsequent visit - Continued pain though no worsening of symptoms.  States slight improvement with inserts - Continue using inserts and Tylenol -Given ketorolac/methylprednisone 30/40 mg IM injection due to continued pain  4. Chronic pain of both knees -Chronic with exacerbation, subsequent visit -Continued, significant bilateral knee pain that has not improved with prior intra-articular CSI and HA injections - X-ray obtained in clinic.  My interpretation: Severe tricompartmental osteoarthritis of bilateral knees without acute  fracture or dislocation - Due to chronic nature of pain, failure to improve with conservative therapy, failure to improve with CSI and HA injections, will order bilateral MRI for further evaluation  - DG Knee 3 Views Right; Future - DG Knee 3 Views Left; Future   -Given ketorolac/methylprednisone 30/40 mg IM injection due to continued pain  Pertinent previous records reviewed include none   Follow Up: After MRIs to review and make treatment plan.  I suspect patient will likely need referral to orthopedic surgery to discuss total knee replacement.  We will also discuss benefit of CSI to right first trigger finger   Subjective:   I, Veronica [name], am serving as a Neurosurgeon for Doctor Richardean Sale  Chief Complaint: hand pain , knee and ankle pain   HPI:   11/22/2020 51 year old female presenting with hand pain. Pain throughout entire hand especially at night occurring for past 3 weeks. Thumb on R hand triggers.    B knee pain and had Gelsyn 6 weeks ago. Patient has had no improvement following these injections. R>L. Pain is constant and worse when she is weight bearing. Patient has been getting cramps in back of leg.    Pain in both ankles that is constant. Has been dealing with pain in foot for many years.    Patient says that she feels her pain all started in her feet and then gradually transition to bilateral knee pain.  Knee pain is described as mostly anterior knee pain.  Denies numbness/tingling/weakness in extremities, saddle paresthesias, urinary incontinence.  She has used prednisone Dosepaks in the past which were beneficial.  Her pain in her bilateral  feet and knees are limiting her day-to-day activities and are causing her significant mental distress.    04/03/2021 Patient states the knees and trigger finger she might need the shot for trigger finger isn't able to daily task for living due to thumb pain   Relevant Historical Information: Chronic bilateral feet and knee  pain  Additional pertinent review of systems negative.   Current Outpatient Medications:    albuterol (VENTOLIN HFA) 108 (90 Base) MCG/ACT inhaler, Inhale into the lungs every 6 (six) hours as needed for wheezing or shortness of breath., Disp: , Rfl:    desloratadine (CLARINEX) 5 MG tablet, Take 1 tablet (5 mg total) by mouth daily., Disp: 90 tablet, Rfl: 3   DULoxetine (CYMBALTA) 20 MG capsule, TAKE 1 CAPSULE BY MOUTH EVERY DAY, Disp: 90 capsule, Rfl: 1   fluticasone (FLONASE) 50 MCG/ACT nasal spray, Place 2 sprays into both nostrils daily., Disp: 16 g, Rfl: 6   fluticasone-salmeterol (ADVAIR DISKUS) 250-50 MCG/ACT AEPB, Inhale 1 puff into the lungs in the morning and at bedtime. Rinse mouth after each use, Disp: 14 each, Rfl: 2   montelukast (SINGULAIR) 10 MG tablet, Take 1 tablet (10 mg total) by mouth at bedtime., Disp: 90 tablet, Rfl: 3   Objective:     Vitals:   04/03/21 1538  BP: 130/80  Pulse: 94  SpO2: 97%  Height: 5\' 4"  (1.626 m)      Body mass index is 52.8 kg/m.    Physical Exam:    General:  awake, alert oriented, no acute distress nontoxic Skin: no suspicious lesions or rashes Neuro:sensation intact, no deficits, strength 5/5 with no deficits, no atrophy, normal muscle tone Psych: No signs of anxiety, depression or other mood disorder  Bilateral knee:   No deformity   ROM Flex 100, Ext 10 TTP medial femoral condyle, lateral femoral condyle, patella, medial joint line, lateral joint line NTTP over the quad tendon,  patella tendon, tibial tuberostiy, fibular head, posterior fossa, pes anserine bursa, gerdy's tubercle,   Neg anterior and posterior drawer Neg lachman Neg sag sign Negative varus stress Negative valgus stress Equivocal McMurray    Gait slowed with short steps     Right hand/wrist:  No deformity or swelling appreciated. Wrist ROM  Ext 90, flexion70, radial/ulnar deviation 30 Triggering of first digit with TTP at base of first digit and  palpable nodule nttp over the snauff box, dorsal carpals, volar carpals, radial styloid, ulnar styloid,  tfcc Negative Tinel's Negative finklestein Neg tfcc bounce test No pain with wrist resisted ext, flex or deviation    Electronically signed by:  D.[name] 4:37 PM 04/03/21

## 2021-04-03 ENCOUNTER — Other Ambulatory Visit: Payer: Self-pay | Admitting: Sports Medicine

## 2021-04-03 ENCOUNTER — Ambulatory Visit (INDEPENDENT_AMBULATORY_CARE_PROVIDER_SITE_OTHER): Payer: BC Managed Care – PPO

## 2021-04-03 ENCOUNTER — Other Ambulatory Visit: Payer: Self-pay

## 2021-04-03 ENCOUNTER — Ambulatory Visit: Payer: BC Managed Care – PPO | Admitting: Sports Medicine

## 2021-04-03 VITALS — BP 130/80 | HR 94 | Ht 64.0 in

## 2021-04-03 DIAGNOSIS — M2141 Flat foot [pes planus] (acquired), right foot: Secondary | ICD-10-CM

## 2021-04-03 DIAGNOSIS — G8929 Other chronic pain: Secondary | ICD-10-CM | POA: Diagnosis not present

## 2021-04-03 DIAGNOSIS — M79671 Pain in right foot: Secondary | ICD-10-CM

## 2021-04-03 DIAGNOSIS — M25561 Pain in right knee: Secondary | ICD-10-CM

## 2021-04-03 DIAGNOSIS — M65311 Trigger thumb, right thumb: Secondary | ICD-10-CM

## 2021-04-03 DIAGNOSIS — M2142 Flat foot [pes planus] (acquired), left foot: Secondary | ICD-10-CM | POA: Diagnosis not present

## 2021-04-03 DIAGNOSIS — M25562 Pain in left knee: Secondary | ICD-10-CM

## 2021-04-03 DIAGNOSIS — M1712 Unilateral primary osteoarthritis, left knee: Secondary | ICD-10-CM | POA: Diagnosis not present

## 2021-04-03 DIAGNOSIS — M79672 Pain in left foot: Secondary | ICD-10-CM

## 2021-04-03 DIAGNOSIS — M1811 Unilateral primary osteoarthritis of first carpometacarpal joint, right hand: Secondary | ICD-10-CM | POA: Diagnosis not present

## 2021-04-03 MED ORDER — KETOROLAC TROMETHAMINE 30 MG/ML IJ SOLN
30.0000 mg | Freq: Once | INTRAMUSCULAR | Status: AC
  Administered 2021-04-03: 30 mg via INTRAMUSCULAR

## 2021-04-03 MED ORDER — METHYLPREDNISOLONE ACETATE 40 MG/ML IJ SUSP
40.0000 mg | Freq: Once | INTRAMUSCULAR | Status: AC
  Administered 2021-04-03: 40 mg via INTRAMUSCULAR

## 2021-04-03 NOTE — Patient Instructions (Addendum)
Good to see you  Bilateral knee MRI referral Call 3 days after MRI to discuss results

## 2021-04-19 ENCOUNTER — Ambulatory Visit (INDEPENDENT_AMBULATORY_CARE_PROVIDER_SITE_OTHER): Payer: BC Managed Care – PPO

## 2021-04-19 ENCOUNTER — Other Ambulatory Visit: Payer: Self-pay

## 2021-04-19 DIAGNOSIS — M79672 Pain in left foot: Secondary | ICD-10-CM | POA: Diagnosis not present

## 2021-04-19 DIAGNOSIS — G8929 Other chronic pain: Secondary | ICD-10-CM

## 2021-04-19 DIAGNOSIS — M2141 Flat foot [pes planus] (acquired), right foot: Secondary | ICD-10-CM

## 2021-04-19 DIAGNOSIS — M79671 Pain in right foot: Secondary | ICD-10-CM

## 2021-04-19 DIAGNOSIS — M25561 Pain in right knee: Secondary | ICD-10-CM | POA: Diagnosis not present

## 2021-04-19 DIAGNOSIS — M25562 Pain in left knee: Secondary | ICD-10-CM

## 2021-04-19 DIAGNOSIS — M2142 Flat foot [pes planus] (acquired), left foot: Secondary | ICD-10-CM

## 2021-04-22 NOTE — Progress Notes (Signed)
Veronica [name] [name] [address] [place] [address] Phone: [phone]   Assessment and Plan:     1. Chronic pain of both knees 2. Bilateral primary osteoarthritis of knee 3. Morbid obesity (HCC) 4. Old tear of meniscus of knee, unspecified laterality, unspecified meniscus, unspecified tear type -Chronic with exacerbation, subsequent visit - Chronic and significant bilateral knee pain that affects day-to-day activities with episodes of giving out - Bilateral MRIs obtained and reviewed with patient in clinic today.  Left knee shows tricompartmental arthritis most severe in the lateral and patellofemoral compartments with bone marrow edema and tibial plateau and associated subchondral fracture, emaciated anterior horn lateral meniscus.  Right knee shows tricompartmental osteoarthritis most severe in the lateral compartment with macerated lateral meniscus tears - Patient has received no relief from intra-articular CSI and HA injections - Due to bilateral findings on MRIs, failure to improve with conservative therapy, I will for patient to discuss possible total knee replacement with orthopedic surgery.  Informed patient that they would likely advise her to decrease her BMI prior to surgery.  5.  Triggering of the right first digit - Resolved after CSI at previous office visit  Pertinent previous records reviewed include MRI left knee 04/19/2021, MRI right knee 04/19/2021   Follow Up: As needed   Subjective:   I, [name], am serving as a Neurosurgeon for Doctor [employer]  Chief Complaint: hand pain, knee and ankle pain  HPI:  11/22/2020 51 year old female presenting with hand pain. Pain throughout entire hand especially at night occurring for past 3 weeks. Thumb on R hand triggers.    B knee pain and had Gelsyn 6 weeks ago. Patient has had no improvement following these injections. R>L. Pain is constant and worse when she is weight  bearing. Patient has been getting cramps in back of leg.    Pain in both ankles that is constant. Has been dealing with pain in foot for many years.    Patient says that she feels her pain all started in her feet and then gradually transition to bilateral knee pain.  Knee pain is described as mostly anterior knee pain.  Denies numbness/tingling/weakness in extremities, saddle paresthesias, urinary incontinence.  She has used prednisone Dosepaks in the past which were beneficial.  Her pain in her bilateral feet and knees are limiting her day-to-day activities and are causing her significant mental distress.    04/03/2021 Patient states the knees and trigger finger she might need the shot for trigger finger isn't able to daily task for living due to thumb pain    04/23/2021 Patient states that she is still in pain thinks a lot of the pain has to do with the weather    Relevant Historical Information: Chronic bilateral feet and knee pain  Additional pertinent review of systems negative.   Current Outpatient Medications:    albuterol (VENTOLIN HFA) 108 (90 Base) MCG/ACT inhaler, Inhale into the lungs every 6 (six) hours as needed for wheezing or shortness of breath., Disp: , Rfl:    desloratadine (CLARINEX) 5 MG tablet, Take 1 tablet (5 mg total) by mouth daily., Disp: 90 tablet, Rfl: 3   DULoxetine (CYMBALTA) 20 MG capsule, TAKE 1 CAPSULE BY MOUTH EVERY DAY, Disp: 90 capsule, Rfl: 1   fluticasone (FLONASE) 50 MCG/ACT nasal spray, Place 2 sprays into both nostrils daily., Disp: 16 g, Rfl: 6   fluticasone-salmeterol (ADVAIR DISKUS) 250-50 MCG/ACT AEPB, Inhale 1 puff into the  lungs in the morning and at bedtime. Rinse mouth after each use, Disp: 14 each, Rfl: 2   montelukast (SINGULAIR) 10 MG tablet, Take 1 tablet (10 mg total) by mouth at bedtime., Disp: 90 tablet, Rfl: 3   Objective:     Vitals:   04/23/21 1519  BP: 132/80  Pulse: 95  SpO2: 96%  Weight: (!) 307 lb (139.3 kg)  Height: 5\' 4"   (1.626 m)      Body mass index is 52.7 kg/m.    Physical Exam:    Bilateral knee:   No deformity   ROM Flex 100, Ext 10 TTP medial femoral condyle, lateral femoral condyle, patella, medial joint line, lateral joint line NTTP over the quad tendon,  patella tendon, tibial tuberostiy, fibular head, posterior fossa, pes anserine bursa, gerdy's tubercle,   Neg lachman Negative varus stress Negative valgus stress Equivocal McMurray     Gait slowed with short steps   Electronically signed by:  D.[name] 3:50 PM 04/23/21

## 2021-04-23 ENCOUNTER — Other Ambulatory Visit: Payer: Self-pay

## 2021-04-23 ENCOUNTER — Ambulatory Visit: Payer: BC Managed Care – PPO | Admitting: Sports Medicine

## 2021-04-23 VITALS — BP 132/80 | HR 95 | Ht 64.0 in | Wt 307.0 lb

## 2021-04-23 DIAGNOSIS — G8929 Other chronic pain: Secondary | ICD-10-CM

## 2021-04-23 DIAGNOSIS — M25561 Pain in right knee: Secondary | ICD-10-CM | POA: Diagnosis not present

## 2021-04-23 DIAGNOSIS — M65311 Trigger thumb, right thumb: Secondary | ICD-10-CM

## 2021-04-23 DIAGNOSIS — M170 Bilateral primary osteoarthritis of knee: Secondary | ICD-10-CM

## 2021-04-23 DIAGNOSIS — M25562 Pain in left knee: Secondary | ICD-10-CM

## 2021-04-23 DIAGNOSIS — M23209 Derangement of unspecified meniscus due to old tear or injury, unspecified knee: Secondary | ICD-10-CM | POA: Diagnosis not present

## 2021-04-23 NOTE — Patient Instructions (Addendum)
Good to see you  Orthopedic referral  Tylenol [phone] mg 2-3 times a day for pain  As needed follow up

## 2021-04-25 ENCOUNTER — Ambulatory Visit: Payer: BC Managed Care – PPO | Admitting: Sports Medicine

## 2021-04-26 ENCOUNTER — Other Ambulatory Visit: Payer: Self-pay

## 2021-04-26 ENCOUNTER — Other Ambulatory Visit: Payer: Self-pay | Admitting: Nurse Practitioner

## 2021-04-26 ENCOUNTER — Ambulatory Visit
Admission: RE | Admit: 2021-04-26 | Discharge: 2021-04-26 | Disposition: A | Discharge status: Home / Self Care | Payer: BC Managed Care – PPO | Source: Ambulatory Visit | Attending: Nurse Practitioner | Admitting: Nurse Practitioner

## 2021-04-26 DIAGNOSIS — N6489 Other specified disorders of breast: Secondary | ICD-10-CM

## 2021-04-26 DIAGNOSIS — R928 Other abnormal and inconclusive findings on diagnostic imaging of breast: Secondary | ICD-10-CM

## 2021-04-26 DIAGNOSIS — R922 Inconclusive mammogram: Secondary | ICD-10-CM | POA: Diagnosis not present

## 2021-05-02 ENCOUNTER — Other Ambulatory Visit: Payer: BC Managed Care – PPO

## 2021-05-04 ENCOUNTER — Other Ambulatory Visit: Payer: Self-pay | Admitting: Nurse Practitioner

## 2021-05-04 DIAGNOSIS — J4520 Mild intermittent asthma, uncomplicated: Secondary | ICD-10-CM

## 2021-05-12 ENCOUNTER — Ambulatory Visit: Payer: BC Managed Care – PPO | Admitting: Nurse Practitioner

## 2021-05-12 ENCOUNTER — Encounter: Payer: Self-pay | Admitting: Nurse Practitioner

## 2021-05-12 ENCOUNTER — Other Ambulatory Visit (HOSPITAL_COMMUNITY)
Admission: RE | Admit: 2021-05-12 | Discharge: 2021-05-12 | Disposition: A | Discharge status: Home / Self Care | Payer: BC Managed Care – PPO | Source: Ambulatory Visit | Attending: Nurse Practitioner | Admitting: Nurse Practitioner

## 2021-05-12 ENCOUNTER — Other Ambulatory Visit: Payer: Self-pay

## 2021-05-12 VITALS — BP 134/83 | HR 100 | Temp 96.8°F

## 2021-05-12 DIAGNOSIS — N941 Unspecified dyspareunia: Secondary | ICD-10-CM

## 2021-05-12 DIAGNOSIS — N939 Abnormal uterine and vaginal bleeding, unspecified: Secondary | ICD-10-CM | POA: Diagnosis not present

## 2021-05-12 HISTORY — DX: Abnormal uterine and vaginal bleeding, unspecified: N93.9

## 2021-05-12 LAB — POCT URINALYSIS DIPSTICK
Bilirubin, UA: NEGATIVE
Glucose, UA: NEGATIVE
Ketones, UA: NEGATIVE
Leukocytes, UA: NEGATIVE
Nitrite, UA: NEGATIVE
Protein, UA: POSITIVE — AB
Spec Grav, UA: 1.025 (ref 1.010–1.025)
Urobilinogen, UA: 0.2 E.U./dL
pH, UA: 5.5 (ref 5.0–8.0)

## 2021-05-12 NOTE — Assessment & Plan Note (Signed)
Vaginal bleeding noted after intercourse on Thursday. She has not had this happen before. She has history of hysterectomy, however still has a cervix, 1 fallopian tube, and both ovaries. Last pap she stated was 2 years ago. U/A negative for leukocytes and nitrates. Unable to tolerate speculum insertion, even with smallest speculum. Did note some bleeding and small openings in vaginal tissue. Swab done to check for gonorrhea, chlamydia, trich, BV, and yeast. Will place referral to GYN for further evaluation. Do not put anything in the vagina for at least a week, and until bleeding stops. She should use water-based lubricant before any vaginal penetration.

## 2021-05-12 NOTE — Patient Instructions (Signed)
It was great to see you!  Avoid intercourse or penetration for at least 1 week and until bleeding subsides. I am going to place a referral to GYN. We did a swab to evaluate for infection/yeast.  Let's follow-up if your symptoms worsen or don't improve.   If a referral was placed today, you will be contacted for an appointment. Please note that routine referrals can sometimes take up to 3-4 weeks to process. Please call our office if you haven't heard anything after this time frame.  Take care,  Vance Peper, NP

## 2021-05-12 NOTE — Progress Notes (Signed)
Acute Office Visit  Subjective:    Patient ID: Veronica [name], female    DOB: Mar 11, 1971, 51 y.o.   MRN: [id number]  Chief Complaint  Patient presents with   Vaginal Bleeding    Pt c/o unusual vaginal bleeding after intercourse w/ abd cramping x3-4 days.     HPI Patient is in today for vaginal bleeding and abdominal cramping after intercourse with her husband on Thursday. Bleeding had slowed down the next day. Then it started bleeding heavier again Saturday night and Sunday night. She thought she had a yeast infection, and used some vagisil with a q-tip and started bleeding and cramping again. She had hysterectomy, but still has 1 fallopian tube, ovaries, and cervix. Has had hot flashes for the last 2 years. Endorses some dysuria and frequency. Denies vaginal discharge, odor, and fevers.   Past Medical History:  Diagnosis Date   Asthma    GERD (gastroesophageal reflux disease)     Past Surgical History:  Procedure Laterality Date   ABDOMINAL HYSTERECTOMY     CHOLECYSTECTOMY     LAPAROSCOPIC GASTRIC BANDING      Family History  Problem Relation Age of Onset   Diabetes Mother    Hypertension Mother    Diabetes Father    Hypertension Father     Social History   Socioeconomic History   Marital status: Married    Spouse name: Not on file   Number of children: Not on file   Years of education: Not on file   Highest education level: Not on file  Occupational History   Not on file  Tobacco Use   Smoking status: Never   Smokeless tobacco: Never  Vaping Use   Vaping Use: Never used  Substance and Sexual Activity   Alcohol use: Not Currently   Drug use: Never   Sexual activity: Yes  Other Topics Concern   Not on file  Social History Narrative   Not on file   Social Determinants of Health   Financial Resource Strain: Not on file  Food Insecurity: Not on file  Transportation Needs: Not on file  Physical Activity: Not on file  Stress: Not on file  Social  Connections: Not on file  Intimate Partner Violence: Not on file    Outpatient Medications Prior to Visit  Medication Sig Dispense Refill   albuterol (VENTOLIN HFA) 108 (90 Base) MCG/ACT inhaler Inhale into the lungs every 6 (six) hours as needed for wheezing or shortness of breath.     desloratadine (CLARINEX) 5 MG tablet Take 1 tablet (5 mg total) by mouth daily. 90 tablet 3   DULoxetine (CYMBALTA) 20 MG capsule TAKE 1 CAPSULE BY MOUTH EVERY DAY 90 capsule 1   fluticasone (FLONASE) 50 MCG/ACT nasal spray Place 2 sprays into both nostrils daily. 16 g 6   fluticasone-salmeterol (ADVAIR DISKUS) 250-50 MCG/ACT AEPB Inhale 1 puff into the lungs in the morning and at bedtime. Rinse mouth after each use 14 each 2   montelukast (SINGULAIR) 10 MG tablet TAKE 1 TABLET BY MOUTH EVERYDAY AT BEDTIME 90 tablet 3   No facility-administered medications prior to visit.    Allergies  Allergen Reactions   Peanut-Containing Drug Products    Percocet [Oxycodone-Acetaminophen]    Shellfish Allergy     Review of Systems See pertinent positives and negatives per HPI.    Objective:    Physical Exam Vitals and nursing note reviewed. Exam conducted with a chaperone present.  Constitutional:  General: She is not in acute distress.    Appearance: Normal appearance. She is obese.  HENT:     Head: Normocephalic.  Eyes:     Conjunctiva/sclera: Conjunctivae normal.  Cardiovascular:     Rate and Rhythm: Normal rate and regular rhythm.     Pulses: Normal pulses.     Heart sounds: Normal heart sounds.  Pulmonary:     Effort: Pulmonary effort is normal.     Breath sounds: Normal breath sounds.  Abdominal:     Palpations: Abdomen is soft.     Tenderness: There is no abdominal tenderness.  Genitourinary:    Exam position: Lithotomy position.     Labia:        Right: No rash, tenderness, lesion or injury.        Left: No rash, tenderness, lesion or injury.      Vagina: Signs of injury present.  Tenderness and bleeding present.     Comments: Unable to tolerate full speculum exam due to pain. Musculoskeletal:     Cervical back: Normal range of motion.  Skin:    General: Skin is warm.  Neurological:     General: No focal deficit present.     Mental Status: She is alert and oriented to person, place, and time.  Psychiatric:        Mood and Affect: Mood normal.        Behavior: Behavior normal.        Thought Content: Thought content normal.        Judgment: Judgment normal.    BP 134/83    Pulse 100    Temp (!) 96.8 F (36 C) (Temporal)    LMP 06/15/2010    SpO2 97%  Wt Readings from Last 3 Encounters:  04/23/21 (!) 307 lb (139.3 kg)  03/04/21 (!) 307 lb 9.6 oz (139.5 kg)  12/13/20 (!) 302 lb (137 kg)    Health Maintenance Due  Topic Date Due   HIV Screening  Never done   Hepatitis C Screening  Never done   PAP SMEAR-Modifier  Never done   COLONOSCOPY (Pts 45-48yrs Insurance coverage will need to be confirmed)  Never done   COVID-19 Vaccine (4 - Booster for Moderna series) 06/01/2020   Zoster Vaccines- Shingrix (2 of 2) 05/06/2021    There are no preventive care reminders to display for this patient.   Lab Results  Component Value Date   TSH 1.52 03/11/2021   Lab Results  Component Value Date   WBC 5.9 03/11/2021   HGB 12.4 03/11/2021   HCT 38.0 03/11/2021   MCV 83.4 03/11/2021   PLT 252.0 03/11/2021   Lab Results  Component Value Date   NA 139 03/11/2021   K 4.0 03/11/2021   CO2 31 03/11/2021   GLUCOSE 133 (H) 03/11/2021   BUN 10 03/11/2021   CREATININE 0.77 03/11/2021   BILITOT 0.4 03/11/2021   ALKPHOS 93 03/11/2021   AST 17 03/11/2021   ALT 13 03/11/2021   PROT 7.5 03/11/2021   ALBUMIN 3.8 03/11/2021   CALCIUM 9.5 03/11/2021   GFR 88.39 03/11/2021   Lab Results  Component Value Date   CHOL 199 03/11/2021   Lab Results  Component Value Date   HDL 59.20 03/11/2021   Lab Results  Component Value Date   LDLCALC 120 (H) 03/11/2021    Lab Results  Component Value Date   TRIG 97.0 03/11/2021   Lab Results  Component Value Date   CHOLHDL 3 03/11/2021  No results found for: HGBA1C     Assessment & Plan:   Problem List Items Addressed This Visit       Other   Vaginal bleeding - Primary    Vaginal bleeding noted after intercourse on Thursday. She has not had this happen before. She has history of hysterectomy, however still has a cervix, 1 fallopian tube, and both ovaries. Last pap she stated was 2 years ago. U/A negative for leukocytes and nitrates. Unable to tolerate speculum insertion, even with smallest speculum. Did note some bleeding and small openings in vaginal tissue. Swab done to check for gonorrhea, chlamydia, trich, BV, and yeast. Will place referral to GYN for further evaluation. Do not put anything in the vagina for at least a week, and until bleeding stops. She should use water-based lubricant before any vaginal penetration.       Relevant Orders   POCT urinalysis dipstick (Completed)   Cervicovaginal ancillary only   Ambulatory referral to Gynecology   Other Visit Diagnoses     Pain in female genitalia on intercourse       See plan for vaginal bleeding   Relevant Orders   Cervicovaginal ancillary only   Ambulatory referral to Gynecology       No orders of the defined types were placed in this encounter.   Gerre Scull, NP

## 2021-05-14 LAB — CERVICOVAGINAL ANCILLARY ONLY
Bacterial Vaginitis (gardnerella): NEGATIVE
Candida Glabrata: NEGATIVE
Candida Vaginitis: NEGATIVE
Chlamydia: NEGATIVE
Comment: NEGATIVE
Comment: NEGATIVE
Comment: NEGATIVE
Comment: NEGATIVE
Comment: NEGATIVE
Comment: NORMAL
Neisseria Gonorrhea: NEGATIVE
Trichomonas: NEGATIVE

## 2021-05-28 DIAGNOSIS — M1711 Unilateral primary osteoarthritis, right knee: Secondary | ICD-10-CM | POA: Diagnosis not present

## 2021-05-28 DIAGNOSIS — M1712 Unilateral primary osteoarthritis, left knee: Secondary | ICD-10-CM | POA: Diagnosis not present

## 2021-05-28 DIAGNOSIS — M170 Bilateral primary osteoarthritis of knee: Secondary | ICD-10-CM | POA: Diagnosis not present

## 2021-05-28 DIAGNOSIS — Z6841 Body Mass Index (BMI) 40.0 and over, adult: Secondary | ICD-10-CM | POA: Diagnosis not present

## 2021-06-25 DIAGNOSIS — M1711 Unilateral primary osteoarthritis, right knee: Secondary | ICD-10-CM | POA: Diagnosis not present

## 2021-06-25 DIAGNOSIS — Z6841 Body Mass Index (BMI) 40.0 and over, adult: Secondary | ICD-10-CM | POA: Diagnosis not present

## 2021-06-25 DIAGNOSIS — M1712 Unilateral primary osteoarthritis, left knee: Secondary | ICD-10-CM | POA: Diagnosis not present

## 2021-06-25 DIAGNOSIS — M170 Bilateral primary osteoarthritis of knee: Secondary | ICD-10-CM | POA: Diagnosis not present

## 2021-07-01 ENCOUNTER — Other Ambulatory Visit: Payer: Self-pay | Admitting: Nurse Practitioner

## 2021-07-01 ENCOUNTER — Ambulatory Visit: Payer: BC Managed Care – PPO | Admitting: Nurse Practitioner

## 2021-07-01 ENCOUNTER — Encounter: Payer: Self-pay | Admitting: Nurse Practitioner

## 2021-07-01 DIAGNOSIS — Z6841 Body Mass Index (BMI) 40.0 and over, adult: Secondary | ICD-10-CM

## 2021-07-01 DIAGNOSIS — E1165 Type 2 diabetes mellitus with hyperglycemia: Secondary | ICD-10-CM

## 2021-07-01 DIAGNOSIS — R739 Hyperglycemia, unspecified: Secondary | ICD-10-CM

## 2021-07-01 LAB — HEMOGLOBIN A1C: Hgb A1c MFr Bld: 8.4 % — ABNORMAL HIGH (ref 4.6–6.5)

## 2021-07-01 MED ORDER — WEGOVY 0.25 MG/0.5ML ~~LOC~~ SOAJ: 0.2500 mg | SUBCUTANEOUS | 0 refills | Status: DC

## 2021-07-01 NOTE — Assessment & Plan Note (Addendum)
Exercise: walking (5000-6000steps per week) and water aerobic ?Diet: meal prep at home, decreased portion size, protein shakes ?Unable to afford weight management clinic. ?Weight affects mobility and inability to proceed with arthroplasty of knees. She has difficulty with prolonged walking and standing. ?Also has hx of OSA with use of CPAP. ? ?Encouraged to start water aerobics and chair exercises ?Maintain heart healthy diet and small meal portions. ?wegovy rx sent ?F/up in 47month ? ?

## 2021-07-01 NOTE — Patient Instructions (Signed)
Go to lab for blood draw ? ?How to Increase Your Level of Physical Activity ?Getting regular physical activity is important for your overall health and well-being. Most people do not get enough exercise. There are easy ways to increase your level of physical activity, even if you have not been very active in the past or if you are just starting out. ?What are the benefits of physical activity? ?Physical activity has many short-term and long-term benefits. Being active on a regular basis can improve your physical and mental health as well as provide other benefits. ?Physical health benefits ?Helping you lose weight or maintain a healthy weight. ?Strengthening your muscles and bones. ?Reducing your risk of certain long-term (chronic) diseases, including heart disease, cancer, and diabetes. ?Being able to move around more easily and for longer periods of time without getting tired (increased endurance or stamina). ?Improving your ability to fight off illness (enhanced immunity). ?Being able to sleep better. ?Helping you stay healthy as you get older, including: ?Helping you stay mobile, or capable of walking and moving around. ?Preventing accidents, such as falls. ?Increasing life expectancy. ?Mental health benefits ?Boosting your mood and improving your self-esteem. ?Lowering your chance of having mental health problems, such as depression or anxiety. ?Helping you feel good about your body. ?Other benefits ?Finding new sources of fun and enjoyment. ?Meeting new people who share a common interest. ?Before you begin ?If you have a chronic illness or have not been active for a while, check with your health care provider about how to get started. Ask your health care provider what activities are safe for you. ?Start out slowly. Walking or doing some simple chair exercises is a good place to start, especially if you have not been active before or for a long time. ?Set goals that you can work toward. Ask your health care  provider how much exercise is best for you. In general, most adults should: ?Do moderate-intensity exercise for at least 150 minutes each week (30 minutes on most days of the week) or vigorous exercise for at least 75 minutes each week, or a combination of these. ?Moderate-intensity exercise can include walking at a quick pace, biking, yoga, water aerobics, or gardening. ?Vigorous exercise involves activities that take more effort, such as jogging or running, playing sports, swimming laps, or jumping rope. ?Do strength exercises on at least 2 days each week. This can include weight lifting, body weight exercises, and resistance-band exercises. ?How to be more physically active ?Make a plan ? ?Try to find activities that you enjoy. You are more likely to commit to an exercise routine if it does not feel like a chore. ?If you have bone or joint problems, choose low-impact exercises, like walking or swimming. ?Use these tips for being successful with an exercise plan: ?Find a workout partner for accountability. ?Join a group or class, such as an aerobics class, cycling class, or sports team. ?Make family time active. Go for a walk, bike, or swim. ?Include a variety of exercises each week. ?Consider using a fitness tracker, such as a mobile phone app or a device worn like a watch, that will count the number of steps you take each day. Many people strive to reach 10,000 steps a day. ?Find ways to be active in your daily routines ?Besides your formal exercise plans, you can find ways to do physical activity during your daily routines, such as: ?Walking or biking to work or to the store. ?Taking the stairs instead of the elevator. ?Parking   farther away from the door at work or at the store. ?Planning walking meetings. ?Walking around while you are on the phone. ?Where to find more information ?Centers for Disease Control and Prevention: www.cdc.gov/physicalactivity ?President's Council on Fitness, Sports & Nutrition:  www.fitness.gov ?ChooseMyPlate: www.myplate.gov ?Contact a health care provider if: ?You have headaches, muscle aches, or joint pain that is concerning. ?You feel dizzy or light-headed while exercising. ?You faint. ?You feel your heart skipping, racing, or fluttering. ?You have chest pain while exercising. ?Summary ?Exercise benefits your mind and body at any age, even if you are just starting out. ?If you have a chronic illness or have not been active for a while, check with your health care provider before increasing your physical activity. ?Choose activities that are safe and enjoyable for you. Ask your health care provider what activities are safe for you. ?Start slowly. Tell your health care provider if you have problems as you start to increase your activity level. ?This information is not intended to replace advice given to you by your health care provider. Make sure you discuss any questions you have with your health care provider. ?Document Revised: 07/05/2020 Document Reviewed: 07/05/2020 ?Elsevier Patient Education ? 2022 [employer]. ? ? ?Calorie Counting for Weight Loss ?Calories are units of energy. Your body needs a certain number of calories from food to keep going throughout the day. When you eat or drink more calories than your body needs, your body stores the extra calories mostly as fat. When you eat or drink fewer calories than your body needs, your body burns fat to get the energy it needs. ?Calorie counting means keeping track of how many calories you eat and drink each day. Calorie counting can be helpful if you need to lose weight. If you eat fewer calories than your body needs, you should lose weight. Ask your health care provider what a healthy weight is for you. ?For calorie counting to work, you will need to eat the right number of calories each day to lose a healthy amount of weight per week. A dietitian can help you figure out how many calories you need in a day and will suggest ways  to reach your calorie goal. ?A healthy amount of weight to lose each week is usually 1-2 lb (0.5-0.9 kg). This usually means that your daily calorie intake should be reduced by 500-750 calories. ?Eating 1,200-1,500 calories a day can help most women lose weight. ?Eating 1,500-1,800 calories a day can help most men lose weight. ?What do I need to know about calorie counting? ?Work with your health care provider or dietitian to determine how many calories you should get each day. To meet your daily calorie goal, you will need to: ?Find out how many calories are in each food that you would like to eat. Try to do this before you eat. ?Decide how much of the food you plan to eat. ?Keep a food log. Do this by writing down what you ate and how many calories it had. ?To successfully lose weight, it is important to balance calorie counting with a healthy lifestyle that includes regular activity. ?Where do I find calorie information? ?The number of calories in a food can be found on a Nutrition Facts label. If a food does not have a Nutrition Facts label, try to look up the calories online or ask your dietitian for help. ?Remember that calories are listed per serving. If you choose to have more than one serving of a food, you   will have to multiply the calories per serving by the number of servings you plan to eat. For example, the label on a package of bread might say that a serving size is 1 slice and that there are 90 calories in a serving. If you eat 1 slice, you will have eaten 90 calories. If you eat 2 slices, you will have eaten 180 calories. ?How do I keep a food log? ?After each time that you eat, record the following in your food log as soon as possible: ?What you ate. Be sure to include toppings, sauces, and other extras on the food. ?How much you ate. This can be measured in cups, ounces, or number of items. ?How many calories were in each food and drink. ?The total number of calories in the food you ate. ?Keep  your food log near you, such as in a pocket-sized notebook or on an app or website on your mobile phone. Some programs will calculate calories for you and show you how many calories you have left to meet your d

## 2021-07-01 NOTE — Progress Notes (Addendum)
? ?             Established Patient Visit ? ?Patient: Veronica [name]   DOB: 12-Aug-1970   51 y.o. Female  MRN: [id number] ?Visit Date: 07/02/2021 ? ?Subjective:  ?  ?Chief Complaint  ?Patient presents with  ? Follow-up  ?  Pt here to discuss weight management and blood glucose, pt states weight goal is 60-70 lbs ot have knee replacement surgery, pt just generally wants to get healthier, pt would like to try either ozempic or wygovy to help control sugars, pt is fasting this morning.   ? ?HPI ?Morbid obesity (HCC) ?Exercise: walking (5000-6000steps per week) and water aerobic ?Diet: meal prep at home, decreased portion size, protein shakes ?Unable to afford weight management clinic. ?Weight affects mobility and inability to proceed with arthroplasty of knees. She has difficulty with prolonged walking and standing. ?Also has hx of OSA with use of CPAP. ? ?Encouraged to start water aerobics and chair exercises ?Maintain heart healthy diet and small meal portions. ?wegovy rx sent ?F/up in 60month ? ?Wt Readings from Last 3 Encounters:  ?07/01/21 (!) 313 lb (142 kg)  ?04/23/21 (!) 307 lb (139.3 kg)  ?03/04/21 (!) 307 lb 9.6 oz (139.5 kg)  ?  ?Reviewed medical, surgical, and social history today ? ?Medications: ?Outpatient Medications Prior to Visit  ?Medication Sig  ? albuterol (VENTOLIN HFA) 108 (90 Base) MCG/ACT inhaler Inhale into the lungs every 6 (six) hours as needed for wheezing or shortness of breath.  ? desloratadine (CLARINEX) 5 MG tablet Take 1 tablet (5 mg total) by mouth daily.  ? DULoxetine (CYMBALTA) 20 MG capsule TAKE 1 CAPSULE BY MOUTH EVERY DAY  ? fluticasone (FLONASE) 50 MCG/ACT nasal spray Place 2 sprays into both nostrils daily.  ? fluticasone-salmeterol (ADVAIR DISKUS) 250-50 MCG/ACT AEPB Inhale 1 puff into the lungs in the morning and at bedtime. Rinse mouth after each use  ? montelukast (SINGULAIR) 10 MG tablet TAKE 1 TABLET BY MOUTH EVERYDAY AT BEDTIME  ? ?No facility-administered medications prior  to visit.  ? ?Reviewed past medical and social history.  ? ?ROS per HPI above ? ? ?   ?Objective:  ?BP 124/85   Pulse 85   Temp 97.8 ?F (36.6 ?C) (Temporal)   Ht 5\' 4"  (1.626 m)   Wt (!) 313 lb (142 kg)   LMP 06/15/2010   SpO2 94%   BMI 53.73 kg/m?  ? ?  ? ?Physical Exam ?Cardiovascular:  ?   Rate and Rhythm: Normal rate.  ?   Pulses: Normal pulses.  ?Pulmonary:  ?   Effort: Pulmonary effort is normal.  ?Neurological:  ?   Mental Status: She is alert and oriented to person, place, and time.  ?Psychiatric:     ?   Mood and Affect: Mood normal.     ?   Behavior: Behavior normal.     ?   Thought Content: Thought content normal.  ?  ?Results for orders placed or performed in visit on 07/01/21  ?Hemoglobin A1c  ?Result Value Ref Range  ? Hgb A1c MFr Bld 8.4 (H) 4.6 - 6.5 %  ? ?   ?Assessment & Plan:  ?  ?Problem List Items Addressed This Visit   ? ?  ? Other  ? Morbid obesity (HCC) - Primary  ?  Exercise: walking (5000-6000steps per week) and water aerobic ?Diet: meal prep at home, decreased portion size, protein shakes ?Unable to afford weight management clinic. ?Weight affects mobility and inability  to proceed with arthroplasty of knees. She has difficulty with prolonged walking and standing. ?Also has hx of OSA with use of CPAP. ? ?Encouraged to start water aerobics and chair exercises ?Maintain heart healthy diet and small meal portions. ?wegovy rx sent ?F/up in 9month ? ?  ?  ? Relevant Medications  ? Semaglutide-Weight Management (WEGOVY) 0.25 MG/0.5ML SOAJ  ? Other Relevant Orders  ? Referral to Nutrition and Diabetes Services  ? ?Other Visit Diagnoses   ? ? Type 2 diabetes mellitus with hyperglycemia, without long-term current use of insulin (HCC)      ? Relevant Medications  ? Semaglutide-Weight Management (WEGOVY) 0.25 MG/0.5ML SOAJ  ? Other Relevant Orders  ? Hemoglobin A1c (Completed)  ? Referral to Nutrition and Diabetes Services  ? BMI 50.0-59.9, adult (HCC)      ? Relevant Medications  ?  Semaglutide-Weight Management (WEGOVY) 0.25 MG/0.5ML SOAJ  ? ?  ? ?Return in about 4 weeks (around 07/29/2021) for weight management. ? ?  ? ?Alysia Penna, NP ? ? ?

## 2021-07-01 NOTE — Telephone Encounter (Signed)
Handled at appointment today

## 2021-07-02 DIAGNOSIS — E1165 Type 2 diabetes mellitus with hyperglycemia: Secondary | ICD-10-CM | POA: Insufficient documentation

## 2021-07-02 DIAGNOSIS — E119 Type 2 diabetes mellitus without complications: Secondary | ICD-10-CM | POA: Insufficient documentation

## 2021-07-02 MED ORDER — OZEMPIC (0.25 OR 0.5 MG/DOSE) 2 MG/1.5ML ~~LOC~~ SOPN: PEN_INJECTOR | SUBCUTANEOUS | 1 refills | Status: DC

## 2021-07-02 NOTE — Addendum Note (Signed)
Addended by: Michaela Corner on: 07/02/2021 02:26 PM ? ? Modules accepted: Orders ? ?

## 2021-07-02 NOTE — Telephone Encounter (Signed)
Rx not covered, do you want to send in another medication? ?

## 2021-07-03 ENCOUNTER — Telehealth: Payer: Self-pay | Admitting: Nurse Practitioner

## 2021-07-03 NOTE — Telephone Encounter (Signed)
There is a message concerning this issue from 07/02/21, I can not add to it. Pt heard from her insurance company a justification for getting this script has to be added. Please advise pt is there is anything else that can be done for her to get this script. [phone] ?

## 2021-07-03 NOTE — Telephone Encounter (Signed)
Pt informed that PA has been sent to insurance plan.  ?

## 2021-07-11 ENCOUNTER — Ambulatory Visit: Payer: BC Managed Care – PPO | Admitting: Nurse Practitioner

## 2021-07-11 ENCOUNTER — Encounter: Payer: Self-pay | Admitting: Nurse Practitioner

## 2021-07-11 VITALS — BP 164/110 | HR 91 | Temp 97.1°F

## 2021-07-11 DIAGNOSIS — J4541 Moderate persistent asthma with (acute) exacerbation: Secondary | ICD-10-CM

## 2021-07-11 DIAGNOSIS — I10 Essential (primary) hypertension: Secondary | ICD-10-CM | POA: Insufficient documentation

## 2021-07-11 DIAGNOSIS — R030 Elevated blood-pressure reading, without diagnosis of hypertension: Secondary | ICD-10-CM

## 2021-07-11 HISTORY — DX: Moderate persistent asthma with (acute) exacerbation: J45.41

## 2021-07-11 MED ORDER — PREDNISONE 10 MG PO TABS: ORAL_TABLET | ORAL | 0 refills | Status: DC

## 2021-07-11 MED ORDER — GUAIFENESIN ER 600 MG PO TB12: 1200.0000 mg | ORAL_TABLET | Freq: Two times a day (BID) | ORAL | 0 refills | Status: DC

## 2021-07-11 NOTE — Assessment & Plan Note (Signed)
Blood pressure today on repeat was 164/110.  She states that she has been having a lot of stress recently, and also took Advil Cold and Sinus to help with her symptoms that she was having.  Encouraged her to check her blood pressure daily at home and limit the amount of salt in her diet.  She has a follow-up with her PCP in a few weeks, will reevaluate at this time.  Blood pressure may be elevated due to acute illness stress. ?

## 2021-07-11 NOTE — Progress Notes (Signed)
? ?Acute Office Visit ? ?Subjective:  ? ?  ?Patient ID: Veronica [name], female    DOB: 21-Apr-1970, 51 y.o.   MRN: [id number] ? ?Chief Complaint  ?Patient presents with  ? Sinus Problem  ?  Pt c/o throat irritation w/ drainage and chest congestion x3 days. No fever  ? ? ?HPI ?Patient is in today for what she thought was allergies. She is in a play and some of the other people are sick as well. ? ?UPPER RESPIRATORY TRACT INFECTION ? ?Fever: no ?Cough: yes ?Shortness of breath: no ?Wheezing: yes ?Chest pain: no ?Chest tightness: no ?Chest congestion: yes ?Nasal congestion: no ?Runny nose: no ?Post nasal drip: yes ?Sneezing: no ?Sore throat:  lump in her throat ?Swollen glands: no ?Sinus pressure: no ?Headache: no ?Face pain: no ?Toothache: no ?Ear pain: no bilateral ?Ear pressure: yes bilateral ?Eyes red/itching:yes ?Eye drainage/crusting: no  ?Vomiting: no ?Rash: no ?Fatigue: yes ?Sick contacts: yes ?Strep contacts: no  ?Context: fluctuating ?Recurrent sinusitis: no ?Relief with OTC cold/cough medications:  some   ?Treatments attempted: increasing fluids, steam, ginger tea, advil cold and sinus ? ?ROS ?See pertinent positives and negatives per HPI. ? ?   ?Objective:  ?  ?BP (!) 164/110 (BP Location: Right Arm, Cuff Size: Large)   Pulse 91   Temp (!) 97.1 ?F (36.2 ?C) (Temporal)   LMP 06/15/2010   SpO2 97%  ?BP Readings from Last 3 Encounters:  ?07/11/21 (!) 164/110  ?07/01/21 124/85  ?05/12/21 134/83  ? ? ?Physical Exam ?Vitals and nursing note reviewed.  ?Constitutional:   ?   General: She is not in acute distress. ?   Appearance: Normal appearance.  ?HENT:  ?   Head: Normocephalic.  ?   Right Ear: Tympanic membrane, ear canal and external ear normal.  ?   Left Ear: Tympanic membrane, ear canal and external ear normal.  ?Eyes:  ?   Conjunctiva/sclera: Conjunctivae normal.  ?Cardiovascular:  ?   Rate and Rhythm: Normal rate and regular rhythm.  ?   Pulses: Normal pulses.  ?   Heart sounds: Normal heart sounds.   ?Pulmonary:  ?   Effort: Pulmonary effort is normal.  ?   Breath sounds: Normal breath sounds.  ?Musculoskeletal:  ?   Cervical back: Normal range of motion. No tenderness.  ?Lymphadenopathy:  ?   Cervical: No cervical adenopathy.  ?Skin: ?   General: Skin is warm.  ?Neurological:  ?   General: No focal deficit present.  ?   Mental Status: She is alert and oriented to person, place, and time.  ?Psychiatric:     ?   Mood and Affect: Mood normal.     ?   Behavior: Behavior normal.     ?   Thought Content: Thought content normal.     ?   Judgment: Judgment normal.  ? ? ?No results found for any visits on 07/11/21. ? ? ?   ?Assessment & Plan:  ? ?Problem List Items Addressed This Visit   ? ?  ? Respiratory  ? Moderate persistent asthma with exacerbation - Primary  ?  She has noticed increasing postnasal drip, with throat irritation and wheezing for the past 3 days.  She states that when she was outside yesterday, and made her symptoms worse.  She is taking Advair twice a day along with albuterol as needed.  She is also taking the generic Zyrtec and montelukast daily.  No wheezing heard on exam today, however with intermittent wheezing  at home and worsening of symptoms with allergies, will start prednisone taper.  We will also have her start taking Mucinex twice a day to help with chest congestion.  Encouraged her to rest, drink plenty of fluids.  Follow-up with any concerns. ? ?  ?  ? Relevant Medications  ? predniSONE (DELTASONE) 10 MG tablet  ?  ? Other  ? Elevated blood pressure reading  ?  Blood pressure today on repeat was 164/110.  She states that she has been having a lot of stress recently, and also took Advil Cold and Sinus to help with her symptoms that she was having.  Encouraged her to check her blood pressure daily at home and limit the amount of salt in her diet.  She has a follow-up with her PCP in a few weeks, will reevaluate at this time.  Blood pressure may be elevated due to acute illness stress. ? ?   ?  ? ? ?Meds ordered this encounter  ?Medications  ? predniSONE (DELTASONE) 10 MG tablet  ?  Sig: Take 6 tablets today, then 5 tablets tomorrow, then decrease by 1 tablet every day until gone  ?  Dispense:  21 tablet  ?  Refill:  0  ? guaiFENesin (MUCINEX) 600 MG 12 hr tablet  ?  Sig: Take 2 tablets (1,200 mg total) by mouth 2 (two) times daily.  ?  Dispense:  28 tablet  ?  Refill:  0  ? ? ?Return if symptoms worsen or fail to improve. ? ?Gerre Scull, NP ? ? ?

## 2021-07-11 NOTE — Assessment & Plan Note (Signed)
She has noticed increasing postnasal drip, with throat irritation and wheezing for the past 3 days.  She states that when she was outside yesterday, and made her symptoms worse.  She is taking Advair twice a day along with albuterol as needed.  She is also taking the generic Zyrtec and montelukast daily.  No wheezing heard on exam today, however with intermittent wheezing at home and worsening of symptoms with allergies, will start prednisone taper.  We will also have her start taking Mucinex twice a day to help with chest congestion.  Encouraged her to rest, drink plenty of fluids.  Follow-up with any concerns. ?

## 2021-07-11 NOTE — Patient Instructions (Signed)
It was great to see you! ? ?Start prednisone taper 6 tablets today, 5 tomorrow, 4 the next, then keep decreasing by 1 every day until gone. Keep drinking plenty of fluids. Start mucinex twice a day. You can gargle with warm salt water. ? ?Check your blood pressure daily and write it down.  ? ?Let's follow-up if your symptoms don't improve or worsen.  ? ?Take care, ? ?Vance Peper, NP ? ?

## 2021-07-23 ENCOUNTER — Encounter: Payer: BC Managed Care – PPO | Admitting: Family Medicine

## 2021-07-31 ENCOUNTER — Encounter: Payer: Self-pay | Admitting: Nurse Practitioner

## 2021-07-31 ENCOUNTER — Other Ambulatory Visit: Payer: Self-pay | Admitting: Orthopaedic Surgery

## 2021-07-31 ENCOUNTER — Ambulatory Visit: Payer: BC Managed Care – PPO | Admitting: Nurse Practitioner

## 2021-07-31 VITALS — BP 132/88 | HR 91 | Temp 97.6°F | Ht 64.0 in | Wt 306.8 lb

## 2021-07-31 DIAGNOSIS — Z23 Encounter for immunization: Secondary | ICD-10-CM | POA: Diagnosis not present

## 2021-07-31 DIAGNOSIS — E1165 Type 2 diabetes mellitus with hyperglycemia: Secondary | ICD-10-CM

## 2021-07-31 DIAGNOSIS — J4520 Mild intermittent asthma, uncomplicated: Secondary | ICD-10-CM

## 2021-07-31 DIAGNOSIS — M25561 Pain in right knee: Secondary | ICD-10-CM

## 2021-07-31 MED ORDER — OZEMPIC (0.25 OR 0.5 MG/DOSE) 2 MG/1.5ML ~~LOC~~ SOPN: 0.5000 mg | PEN_INJECTOR | SUBCUTANEOUS | 0 refills | Status: DC

## 2021-07-31 NOTE — Progress Notes (Signed)
? ?             Established Patient Visit ? ?Patient: Veronica [name]   DOB: [birth year]   51 y.o. Female  MRN: [id number] ?Visit Date: 08/01/2021 ? ?Subjective:  ?  ?Chief Complaint  ?Patient presents with  ? Office Visit  ?  F/u on Weight Management  ?States daughter's graduation/party has effected her diet some, but she's been back on track for two days. ?Says Ozempic has bothered her stomach some but it has gotten better. ?Shingles vaccine given today.  ? ?HPI ?Morbid obesity (HCC) ?Lost 7lbs in last 4weeks with ozempic ?Denies any averse side effects. ?Wt Readings from Last 3 Encounters:  ?07/31/21 (!) 306 lb 12.8 oz (139.2 kg)  ?07/01/21 (!) 313 lb (142 kg)  ?04/23/21 (!) 307 lb (139.3 kg)  ? ?Advised to continue heart healthy meal choices, small meal portions and non weight bearing exercise due to severe OA of knee. ?Maintain ozempic dose at 0.5mg  weekly ?F/up in 81month ? ?Reviewed medical, surgical, and social history today ? ?Medications: ?Outpatient Medications Prior to Visit  ?Medication Sig  ? albuterol (VENTOLIN HFA) 108 (90 Base) MCG/ACT inhaler Inhale into the lungs every 6 (six) hours as needed for wheezing or shortness of breath.  ? DULoxetine (CYMBALTA) 20 MG capsule TAKE 1 CAPSULE BY MOUTH EVERY DAY  ? fluticasone (FLONASE) 50 MCG/ACT nasal spray Place 2 sprays into both nostrils daily.  ? fluticasone-salmeterol (ADVAIR DISKUS) 250-50 MCG/ACT AEPB Inhale 1 puff into the lungs in the morning and at bedtime. Rinse mouth after each use  ? guaiFENesin (MUCINEX) 600 MG 12 hr tablet Take 2 tablets (1,200 mg total) by mouth 2 (two) times daily.  ? ibuprofen (ADVIL) 800 MG tablet Take 800 mg by mouth 3 (three) times daily.  ? [DISCONTINUED] Semaglutide,0.25 or 0.5MG /DOS, (OZEMPIC, 0.25 OR 0.5 MG/DOSE,) 2 MG/1.5ML SOPN 0.25mg  weekly x 2weeks, then 0.5mg  weekly continuously  ? desloratadine (CLARINEX) 5 MG tablet Take 1 tablet (5 mg total) by mouth daily. (Patient not taking: Reported on 07/31/2021)  ?  montelukast (SINGULAIR) 10 MG tablet TAKE 1 TABLET BY MOUTH EVERYDAY AT BEDTIME (Patient not taking: Reported on 07/31/2021)  ? [DISCONTINUED] predniSONE (DELTASONE) 10 MG tablet Take 6 tablets today, then 5 tablets tomorrow, then decrease by 1 tablet every day until gone (Patient not taking: Reported on 07/31/2021)  ? ?No facility-administered medications prior to visit.  ? ?Reviewed past medical and social history.  ? ?ROS per HPI above ? ? ?   ?Objective:  ?BP 132/88 (BP Location: Left Arm, Patient Position: Sitting, Cuff Size: Normal)   Pulse 91   Temp 97.6 ?F (36.4 ?C) (Temporal)   Ht 5\' 4"  (1.626 m)   Wt (!) 306 lb 12.8 oz (139.2 kg)   LMP 06/15/2010   SpO2 95%   BMI 52.66 kg/m?  ? ?  ? ?Physical Exam ?Constitutional:   ?   Appearance: She is obese.  ?Cardiovascular:  ?   Rate and Rhythm: Normal rate.  ?   Pulses: Normal pulses.  ?Pulmonary:  ?   Effort: Pulmonary effort is normal.  ?Musculoskeletal:  ?   Right lower leg: No edema.  ?   Left lower leg: No edema.  ?Neurological:  ?   Mental Status: She is alert and oriented to person, place, and time.  ?Psychiatric:     ?   Mood and Affect: Mood normal.     ?   Behavior: Behavior normal.     ?  Thought Content: Thought content normal.  ?  ?Results for orders placed or performed in visit on 07/31/21  ?Microalbumin / creatinine urine ratio  ?Result Value Ref Range  ? Microalb, Ur 4.1 (H) 0.0 - 1.9 mg/dL  ? Creatinine,U 168.3 mg/dL  ? Microalb Creat Ratio 2.4 0.0 - 30.0 mg/g  ? ?   ?Assessment & Plan:  ?  ?Problem List Items Addressed This Visit   ? ?  ? Endocrine  ? Type 2 diabetes mellitus with hyperglycemia, without long-term current use of insulin (HCC) - Primary  ? Relevant Medications  ? Semaglutide,0.25 or 0.5MG /DOS, (OZEMPIC, 0.25 OR 0.5 MG/DOSE,) 2 MG/1.5ML SOPN  ? Other Relevant Orders  ? Microalbumin / creatinine urine ratio (Completed)  ? Ambulatory referral to Ophthalmology  ?  ? Other  ? Morbid obesity (HCC)  ?  Lost 7lbs in last 4weeks with  ozempic ?Denies any averse side effects. ?Wt Readings from Last 3 Encounters:  ?07/31/21 (!) 306 lb 12.8 oz (139.2 kg)  ?07/01/21 (!) 313 lb (142 kg)  ?04/23/21 (!) 307 lb (139.3 kg)  ? ?Advised to continue heart healthy meal choices, small meal portions and non weight bearing exercise due to severe OA of knee. ?Maintain ozempic dose at 0.5mg  weekly ?F/up in 63month ?  ?  ? Relevant Medications  ? Semaglutide,0.25 or 0.5MG /DOS, (OZEMPIC, 0.25 OR 0.5 MG/DOSE,) 2 MG/1.5ML SOPN  ? ?Other Visit Diagnoses   ? ? Need for shingles vaccine      ? Relevant Orders  ? Varicella-zoster vaccine IM (Shingrix) (Completed)  ? ?  ? ?Return in about 1 month (around 09/01/2021) for weight management and DM. ? ?  ? ?Alysia Penna, NP ? ? ?

## 2021-07-31 NOTE — Patient Instructions (Signed)
Maintain ozempic dose at 0.5mg  weekly ?Go to lab ?F/up in 83month ?

## 2021-08-01 ENCOUNTER — Encounter: Payer: Self-pay | Admitting: Nurse Practitioner

## 2021-08-01 LAB — MICROALBUMIN / CREATININE URINE RATIO
Creatinine,U: 168.3 mg/dL
Microalb Creat Ratio: 2.4 mg/g (ref 0.0–30.0)
Microalb, Ur: 4.1 mg/dL — ABNORMAL HIGH (ref 0.0–1.9)

## 2021-08-01 NOTE — Assessment & Plan Note (Addendum)
Lost 7lbs in last 4weeks with ozempic ?Denies any averse side effects. ?Wt Readings from Last 3 Encounters:  ?07/31/21 (!) 306 lb 12.8 oz (139.2 kg)  ?07/01/21 (!) 313 lb (142 kg)  ?04/23/21 (!) 307 lb (139.3 kg)  ? ?Advised to continue heart healthy meal choices, small meal portions and non weight bearing exercise due to severe OA of knee. ?Maintain ozempic dose at 0.5mg  weekly ?F/up in 34month ?

## 2021-08-04 ENCOUNTER — Encounter: Payer: Self-pay | Admitting: *Deleted

## 2021-08-04 ENCOUNTER — Ambulatory Visit
Admission: RE | Admit: 2021-08-04 | Discharge: 2021-08-04 | Disposition: A | Discharge status: Home / Self Care | Payer: BC Managed Care – PPO | Source: Ambulatory Visit | Attending: Orthopaedic Surgery | Admitting: Orthopaedic Surgery

## 2021-08-04 DIAGNOSIS — M25562 Pain in left knee: Secondary | ICD-10-CM

## 2021-08-04 DIAGNOSIS — M25561 Pain in right knee: Secondary | ICD-10-CM | POA: Diagnosis not present

## 2021-08-04 HISTORY — PX: IR RADIOLOGIST EVAL & MGMT: IMG5224

## 2021-08-04 NOTE — Consult Note (Signed)
? ? ?Chief Complaint: ?Bilateral knee pain ? ?Referring Physician(s): ?Dalldorf,Peter ? ?History of Present Illness: ?Veronica [name] is a 51 y.o. female with past medical history significant for asthma, GERD and obesity (post laparoscopic gastric banding), who is seen today in telemedicine consultation for evaluation of bilateral knee pain. ? ?Patient reports bilateral knee pain though states the left knee is worse than the right. ? ?She has a long history of left-sided knee pain of greater than 10 years which she initially injured while dancing at a party.  Previously patient's left-sided knee pain was adequately controlled with gel shots however she received a gel shot into her left knee approximately 1 year ago but it did not significantly improve her knee pain. ? ?Patient has also undergone multiple left-sided knee steroid injections though states these injections only lasted approximately 1 to 2 weeks and their efficacy has reduced recently. ? ?Patient reports her right knee pain began approximately 1 year ago which she attributes to compensation for her left-sided knee pain as well as for bilateral feet pain. ? ?Patient is states that she has been told she has advanced osteoarthritis affecting both of her knees and will ultimately require bilateral knee replacements however currently she is not a candidate given her BMI for which she needs to lose at least 60 pounds to be considered for the surgery. ? 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  Specifically, she has to use a electric cart when grocery shopping. ? ?She currently rates her left-sided knee pain as 5 / 10 at rest and 10 / 10 with activity. ? ? ?Past Medical History:  ?Diagnosis Date  ? Asthma   ? GERD (gastroesophageal reflux disease)   ? ? ?Past Surgical History:  ?Procedure Laterality Date  ? ABDOMINAL HYSTERECTOMY    ? CHOLECYSTECTOMY    ? LAPAROSCOPIC GASTRIC BANDING    ? ? ?Allergies: ?Soy  allergy, Peanut-containing drug products, Percocet [oxycodone-acetaminophen], and Shellfish allergy ? ?Medications: ?Prior to Admission medications   ?Medication Sig Start Date End Date Taking? Authorizing Provider  ?albuterol (VENTOLIN HFA) 108 (90 Base) MCG/ACT inhaler Inhale into the lungs every 6 (six) hours as needed for wheezing or shortness of breath.    [provider]  ?desloratadine (CLARINEX) 5 MG tablet Take 1 tablet (5 mg total) by mouth daily. ?Patient not taking: Reported on 07/31/2021 04/09/20   Nche, Charlene Brooke, NP  ?DULoxetine (CYMBALTA) 20 MG capsule TAKE 1 CAPSULE BY MOUTH EVERY DAY 03/04/21   Nche, Charlene Brooke, NP  ?fluticasone (FLONASE) 50 MCG/ACT nasal spray Place 2 sprays into both nostrils daily. 10/01/20   Haydee Salter, MD  ?fluticasone-salmeterol (ADVAIR DISKUS) 250-50 MCG/ACT AEPB Inhale 1 puff into the lungs in the morning and at bedtime. Rinse mouth after each use 01/22/21   Nche, Charlene Brooke, NP  ?guaiFENesin (MUCINEX) 600 MG 12 hr tablet Take 2 tablets (1,200 mg total) by mouth 2 (two) times daily. 07/11/21   McElwee, Lauren A, NP  ?ibuprofen (ADVIL) 800 MG tablet Take 800 mg by mouth 3 (three) times daily. 07/08/21   [provider]  ?montelukast (SINGULAIR) 10 MG tablet TAKE 1 TABLET BY MOUTH EVERYDAY AT BEDTIME ?Patient not taking: Reported on 07/31/2021 05/05/21   Nche, Charlene Brooke, NP  ?Semaglutide,0.25 or 0.5MG /DOS, (OZEMPIC, 0.25 OR 0.5 MG/DOSE,) 2 MG/1.5ML SOPN Inject 0.5 mg into the skin once a week. 0.5mg  weekly 07/31/21   Nche, Charlene Brooke, NP  ?  ? ?Family  History  ?Problem Relation Age of Onset  ? Diabetes Mother   ? Hypertension Mother   ? Diabetes Father   ? Hypertension Father   ? ? ?Social History  ? ?Socioeconomic History  ? Marital status: Married  ?  Spouse name: Not on file  ? Number of children: Not on file  ? Years of education: Not on file  ? Highest education level: Not on file  ?Occupational History  ? Not on file  ?Tobacco Use  ?  Smoking status: Never  ? Smokeless tobacco: Never  ?Vaping Use  ? Vaping Use: Never used  ?Substance and Sexual Activity  ? Alcohol use: Not Currently  ? Drug use: Never  ? Sexual activity: Yes  ?Other Topics Concern  ? Not on file  ?Social History Narrative  ? Not on file  ? ?Social Determinants of Health  ? ?Financial Resource Strain: Not on file  ?Food Insecurity: Not on file  ?Transportation Needs: Not on file  ?Physical Activity: Not on file  ?Stress: Not on file  ?Social Connections: Not on file  ? ? ?ECOG Status: ?2 - Symptomatic, <50% confined to bed ? ?Review of Systems ? ?Review of Systems: A 12 point ROS discussed and pertinent positives are indicated in the HPI above.  All other systems are negative. ? ?Physical Exam ?No direct physical exam was performed (except for noted visual exam findings with Video Visits).  ? ?Vital Signs: ?LMP 06/15/2010  ? ?Imaging: ?No results found. ? ?Labs: ? ?CBC: ?Recent Labs  ?  01/20/21 ?1258 03/11/21 ?0920  ?WBC 6.7 5.9  ?HGB 12.1 12.4  ?HCT 37.9 38.0  ?PLT 286.0 252.0  ? ? ?COAGS: ?No results for input(s): INR, APTT in the last 8760 hours. ? ?BMP: ?Recent Labs  ?  01/20/21 ?1258 03/11/21 ?0920  ?NA 141 139  ?K 3.4* 4.0  ?CL 104 101  ?CO2 30 31  ?GLUCOSE 93 133*  ?BUN 13 10  ?CALCIUM 9.1 9.5  ?CREATININE 0.81 0.77  ? ? ?LIVER FUNCTION TESTS: ?Recent Labs  ?  03/11/21 ?0920  ?BILITOT 0.4  ?AST 17  ?ALT 13  ?ALKPHOS 93  ?PROT 7.5  ?ALBUMIN 3.8  ? ? ?TUMOR MARKERS: ?No results for input(s): AFPTM, CEA, CA199, CHROMGRNA in the last 8760 hours. ? ?Assessment and Plan: ? ?Veronica [name] is a 51 y.o. female with past medical history significant for asthma, GERD and obesity (post laparoscopic gastric banding), who is seen today in telemedicine consultation for evaluation of bilateral knee pain. ? ?Patient reports bilateral knee pain though states the left knee is worse than the right. ? ?She currently rates her left-sided knee pain as 5 / 10 at rest and 10 / 10 with  activity. ? ?I explained that the [place] treatment is a new modality that utilizes cyroneurolysis technology to temporarily alter the pain recepting nerves supplying the anterior aspect of the knee, in hopes of achieving a clinically significant reduction in knee pain.   ?  ?I explained that if successful, the patient will experience a rapid pain reduction which can last for approximately 3 months.  I explained that while their knee pain may be reduced, the structural damage of the knee remains, and thus, this is not a curative technology and their knee pain will return.   ?  ?Risks associated with the procedure include bleeding/bruising, infection and post procedural paresthesias/weakness. ?  ?The procedure is performed as an outpatient basis at [facility].  The procedure is performed  soley with local anesthesia, and therefore the patient does need to be NPO, there is no postprocedural recovery and the patient does not need a driver home.  Additionally, the patient does not have to hold any other their medications, including anticoagulation. ? ?On the day of the procedure, the patient is encouraged to wear either shorts or loose fitting pants that can be rolled up to the upper thigh.   ?  ?Following this prolonged and detailed conversation, the patient wishes to pursue IOVERA therapy of the left knee.   ? 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 ? ?I also explained to the patient that if she has a positive outcome following the left-sided iovera therapy, we can consider proceeding with right-sided iovera therapy as indicated. ? ?The patient knows to call the interventional radiology clinic with any interval questions or concerns ? 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 ? ?Thank you for this interesting consult.  I greatly enjoyed meeting Veronica [name] and look forward to participating in their car

## 2021-08-06 ENCOUNTER — Other Ambulatory Visit (HOSPITAL_COMMUNITY): Payer: Self-pay | Admitting: Interventional Radiology

## 2021-08-06 DIAGNOSIS — M25562 Pain in left knee: Secondary | ICD-10-CM

## 2021-08-21 ENCOUNTER — Ambulatory Visit (HOSPITAL_COMMUNITY)
Admission: RE | Admit: 2021-08-21 | Discharge: 2021-08-21 | Disposition: A | Discharge status: Home / Self Care | Payer: BC Managed Care – PPO | Source: Ambulatory Visit | Attending: Interventional Radiology | Admitting: Interventional Radiology

## 2021-08-21 DIAGNOSIS — M25562 Pain in left knee: Secondary | ICD-10-CM | POA: Insufficient documentation

## 2021-08-21 HISTORY — PX: IR ABLATE LIVER CRYOABLATION: IMG5524

## 2021-08-21 MED ORDER — LIDOCAINE-EPINEPHRINE 1 %-1:100000 IJ SOLN
INTRAMUSCULAR | Status: DC | PRN
  Administered 2021-08-21: 10 mL

## 2021-08-21 MED ORDER — LIDOCAINE-EPINEPHRINE 1 %-1:100000 IJ SOLN
INTRAMUSCULAR | Status: AC
  Filled 2021-08-21: qty 1

## 2021-08-22 ENCOUNTER — Other Ambulatory Visit: Payer: Self-pay | Admitting: Interventional Radiology

## 2021-08-22 DIAGNOSIS — M25561 Pain in right knee: Secondary | ICD-10-CM

## 2021-08-23 ENCOUNTER — Encounter (HOSPITAL_COMMUNITY): Payer: Self-pay | Admitting: Radiology

## 2021-08-23 ENCOUNTER — Other Ambulatory Visit (HOSPITAL_COMMUNITY): Payer: Self-pay | Admitting: Interventional Radiology

## 2021-08-23 DIAGNOSIS — M25562 Pain in left knee: Secondary | ICD-10-CM

## 2021-08-28 ENCOUNTER — Encounter: Payer: Self-pay | Admitting: Gastroenterology

## 2021-09-01 ENCOUNTER — Encounter: Payer: Self-pay | Admitting: Nurse Practitioner

## 2021-09-01 ENCOUNTER — Ambulatory Visit: Payer: BC Managed Care – PPO | Admitting: Nurse Practitioner

## 2021-09-01 VITALS — BP 132/92 | HR 85 | Temp 96.7°F | Ht 64.0 in | Wt 301.8 lb

## 2021-09-01 DIAGNOSIS — E1165 Type 2 diabetes mellitus with hyperglycemia: Secondary | ICD-10-CM

## 2021-09-01 DIAGNOSIS — I10 Essential (primary) hypertension: Secondary | ICD-10-CM | POA: Diagnosis not present

## 2021-09-01 MED ORDER — OZEMPIC (2 MG/DOSE) 8 MG/3ML ~~LOC~~ SOPN: 2.0000 mg | PEN_INJECTOR | SUBCUTANEOUS | 0 refills | Status: DC

## 2021-09-01 MED ORDER — LOSARTAN POTASSIUM 25 MG PO TABS: 25.0000 mg | ORAL_TABLET | Freq: Every day | ORAL | 1 refills | Status: DC

## 2021-09-01 NOTE — Assessment & Plan Note (Signed)
Persistent elevated BP despite dietary modification: DASH diet BP Readings from Last 3 Encounters:  09/01/21 (!) 132/92  07/31/21 132/88  07/11/21 (!) 164/110   Start losartan 25mg  daily F/up in 99month

## 2021-09-01 NOTE — Patient Instructions (Addendum)
Increase ozempic dose to 2mg  weekly. Start losartan 25mg  daily F/up in 4-6weeks (fasting)

## 2021-09-01 NOTE — Progress Notes (Signed)
Established Patient Visit  Patient: Veronica [name]   DOB: 1970/12/14   51 y.o. Female  MRN: [id number] Visit Date: 09/01/2021  Subjective:    Chief Complaint  Patient presents with   Office Visit    F/u Weight Management, DM Has seen changes in weight since last month No concerns   HPI HTN (hypertension), benign Persistent elevated BP despite dietary modification: DASH diet BP Readings from Last 3 Encounters:  09/01/21 (!) 132/92  07/31/21 132/88  07/11/21 (!) 164/110   Start losartan 25mg  daily F/up in 62month  Morbid obesity (HCC) Lost 5lbs in last 59month Lost total of 7lbs in last 67months with Ozempic 0.2 to 0.5mg  Denies any adverse side effects Had maintained DASh diet Limited exercise due to knee pain She plans to start water aerobics this summer. Wt Readings from Last 3 Encounters:  09/01/21 (!) 301 lb 12.8 oz (136.9 kg)  07/31/21 (!) 306 lb 12.8 oz (139.2 kg)  07/01/21 (!) 313 lb (142 kg)   Increase ozempic to 2mg  weekly Unable to prescribe 1mg  dose due to drug shortage. F/up in 34month  Type 2 diabetes mellitus with hyperglycemia, without long-term current use of insulin (HCC) No adverse effects with ozempic 0.5mg  weekly No glucose check at home Start losartan for renal protection and BP control  Increase ozempic dose to 2mg  if 1mg  dose is not available F/up 27month    Reviewed medical, surgical, and social history today  Medications: Outpatient Medications Prior to Visit  Medication Sig   albuterol (VENTOLIN HFA) 108 (90 Base) MCG/ACT inhaler Inhale into the lungs every 6 (six) hours as needed for wheezing or shortness of breath.   DULoxetine (CYMBALTA) 20 MG capsule TAKE 1 CAPSULE BY MOUTH EVERY DAY   fluticasone (FLONASE) 50 MCG/ACT nasal spray Place 2 sprays into both nostrils daily.   fluticasone-salmeterol (ADVAIR DISKUS) 250-50 MCG/ACT AEPB Inhale 1 puff into the lungs in the morning and at bedtime. Rinse mouth after each use    guaiFENesin (MUCINEX) 600 MG 12 hr tablet Take 2 tablets (1,200 mg total) by mouth 2 (two) times daily.   ibuprofen (ADVIL) 800 MG tablet Take 800 mg by mouth 3 (three) times daily.   [DISCONTINUED] Semaglutide,0.25 or 0.5MG /DOS, (OZEMPIC, 0.25 OR 0.5 MG/DOSE,) 2 MG/1.5ML SOPN Inject 0.5 mg into the skin once a week. 0.5mg  weekly   [DISCONTINUED] desloratadine (CLARINEX) 5 MG tablet Take 1 tablet (5 mg total) by mouth daily. (Patient not taking: Reported on 07/31/2021)   [DISCONTINUED] montelukast (SINGULAIR) 10 MG tablet TAKE 1 TABLET BY MOUTH EVERYDAY AT BEDTIME (Patient not taking: Reported on 07/31/2021)   No facility-administered medications prior to visit.   Reviewed past medical and social history.   ROS per HPI above      Objective:  BP (!) 132/92 (BP Location: Left Arm, Patient Position: Sitting, Cuff Size: Normal)   Pulse 85   Temp (!) 96.7 F (35.9 C) (Temporal)   Ht 5\' 4"  (1.626 m)   Wt (!) 301 lb 12.8 oz (136.9 kg)   LMP 06/15/2010   SpO2 96%   BMI 51.80 kg/m      Physical Exam Vitals reviewed.  Cardiovascular:     Rate and Rhythm: Normal rate.     Pulses: Normal pulses.  Pulmonary:     Effort: Pulmonary effort is normal.  Musculoskeletal:     Right lower leg: No edema.     Left lower leg:  No edema.  Neurological:     Mental Status: She is alert and oriented to person, place, and time.  Psychiatric:        Mood and Affect: Mood normal.        Behavior: Behavior normal.     No results found for any visits on 09/01/21.    Assessment & Plan:    Problem List Items Addressed This Visit       Cardiovascular and Mediastinum   HTN (hypertension), benign    Persistent elevated BP despite dietary modification: DASH diet BP Readings from Last 3 Encounters:  09/01/21 (!) 132/92  07/31/21 132/88  07/11/21 (!) 164/110   Start losartan 25mg  daily F/up in 47month      Relevant Medications   losartan (COZAAR) 25 MG tablet     Endocrine   Type 2 diabetes  mellitus with hyperglycemia, without long-term current use of insulin (HCC) - Primary    No adverse effects with ozempic 0.5mg  weekly No glucose check at home Start losartan for renal protection and BP control  Increase ozempic dose to 2mg  if 1mg  dose is not available F/up 58month      Relevant Medications   Semaglutide, 2 MG/DOSE, (OZEMPIC, 2 MG/DOSE,) 8 MG/3ML SOPN   losartan (COZAAR) 25 MG tablet     Other   Morbid obesity (HCC)    Lost 5lbs in last 33month Lost total of 7lbs in last 17months with Ozempic 0.2 to 0.5mg  Denies any adverse side effects Had maintained DASh diet Limited exercise due to knee pain She plans to start water aerobics this summer. Wt Readings from Last 3 Encounters:  09/01/21 (!) 301 lb 12.8 oz (136.9 kg)  07/31/21 (!) 306 lb 12.8 oz (139.2 kg)  07/01/21 (!) 313 lb (142 kg)   Increase ozempic to 2mg  weekly Unable to prescribe 1mg  dose due to drug shortage. F/up in 74month      Relevant Medications   Semaglutide, 2 MG/DOSE, (OZEMPIC, 2 MG/DOSE,) 8 MG/3ML SOPN   Return in about 4 weeks (around 09/29/2021) for DM and HTN, hyperlipidemia (fasting, foot exam, foot exam, PAP and colonoscopy?).     08/31/21, NP

## 2021-09-01 NOTE — Assessment & Plan Note (Addendum)
Lost 5lbs in last 9month Lost total of 7lbs in last 68months with Ozempic 0.2 to 0.5mg  Denies any adverse side effects Had maintained DASh diet Limited exercise due to knee pain She plans to start water aerobics this summer. Wt Readings from Last 3 Encounters:  09/01/21 (!) 301 lb 12.8 oz (136.9 kg)  07/31/21 (!) 306 lb 12.8 oz (139.2 kg)  07/01/21 (!) 313 lb (142 kg)   Increase ozempic to 2mg  weekly Unable to prescribe 1mg  dose due to drug shortage. F/up in 54month

## 2021-09-01 NOTE — Assessment & Plan Note (Signed)
No adverse effects with ozempic 0.5mg  weekly No glucose check at home Start losartan for renal protection and BP control  Increase ozempic dose to 2mg  if 1mg  dose is not available F/up 45month

## 2021-09-11 ENCOUNTER — Ambulatory Visit
Admission: RE | Admit: 2021-09-11 | Discharge: 2021-09-11 | Disposition: A | Discharge status: Home / Self Care | Payer: BC Managed Care – PPO | Source: Ambulatory Visit | Attending: Interventional Radiology | Admitting: Interventional Radiology

## 2021-09-11 ENCOUNTER — Other Ambulatory Visit (HOSPITAL_COMMUNITY): Payer: Self-pay | Admitting: Interventional Radiology

## 2021-09-11 ENCOUNTER — Ambulatory Visit (INDEPENDENT_AMBULATORY_CARE_PROVIDER_SITE_OTHER): Payer: BC Managed Care – PPO | Admitting: Ophthalmology

## 2021-09-11 ENCOUNTER — Encounter (INDEPENDENT_AMBULATORY_CARE_PROVIDER_SITE_OTHER): Payer: Self-pay | Admitting: Ophthalmology

## 2021-09-11 DIAGNOSIS — M25561 Pain in right knee: Secondary | ICD-10-CM

## 2021-09-11 DIAGNOSIS — E119 Type 2 diabetes mellitus without complications: Secondary | ICD-10-CM | POA: Diagnosis not present

## 2021-09-11 DIAGNOSIS — H43823 Vitreomacular adhesion, bilateral: Secondary | ICD-10-CM | POA: Insufficient documentation

## 2021-09-11 DIAGNOSIS — E1165 Type 2 diabetes mellitus with hyperglycemia: Secondary | ICD-10-CM

## 2021-09-11 DIAGNOSIS — M25562 Pain in left knee: Secondary | ICD-10-CM | POA: Diagnosis not present

## 2021-09-11 DIAGNOSIS — Z9889 Other specified postprocedural states: Secondary | ICD-10-CM | POA: Diagnosis not present

## 2021-09-11 HISTORY — PX: IR RADIOLOGIST EVAL & MGMT: IMG5224

## 2021-09-11 LAB — HM DIABETES EYE EXAM

## 2021-09-11 NOTE — Progress Notes (Signed)
Patient ID: Veronica [name], female   DOB: 24-Nov-1970, 51 y.o.   MRN: [id number]        Chief Complaint: Dalldorf,Peter   History of Present Illness: Veronica [name] is a 51 y.o. female with past medical history significant for asthma, GERD and obesity (post laparoscopic gastric banding), who is seen today in telemedicine consultation following technically successful left-sided iovera therapy performed 08/21/2021.  In review, the patient reports bilateral knee pain though states the left knee is worse than the right.  She has a long history of left-sided knee pain of greater than 10 years which she initially injured while dancing at a party.  Previously patient's left-sided knee pain was adequately controlled with gel shots however she received a gel shot into her left knee approximately 1 year ago but it did not significantly improve her knee pain.  Patient has also undergone multiple left-sided knee steroid injections though states these injections only lasted approximately 1 to 2 weeks and their efficacy has reduced recently.  Patient reports her right knee pain began approximately 1 year ago which she attributes to compensation for her left-sided knee pain as well as for bilateral feet pain.  Patient is states that she has been told she has advanced osteoarthritis affecting both of her knees and will ultimately require bilateral knee replacements however currently she is not a candidate given her BMI for which she needs to lose at least 60 pounds to be considered for the surgery.  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  Specifically, she has to use a electric cart when grocery shopping.   Prior to the procedure, the patient rated her left-sided knee pain as 5 / 10 at rest and 10 / 10 with activity  For this, the patient underwent technically successful left-sided knee iovera therapy with immediate postprocedural pain scale ratings as  follows: At rest: 2/10. With activity: 3/10  Patient states that she has experienced a successful and durable result since undergoing the iovera therapy with pain scales as follows: At rest: 1/10 dull pain, less sharp pain With activity: 2/10  She describes her pain as more dull and less sharp than prior to the procedure.  She does recent port some tenderness at the ablation sites at her thigh.  Patient is overall very pleased with the procedure and wishes to pursue iovera therapy of her right knee, preferably next week as she is going away for the July 4th holiday.    Past Medical History:  Diagnosis Date   Asthma    GERD (gastroesophageal reflux disease)     Past Surgical History:  Procedure Laterality Date   ABDOMINAL HYSTERECTOMY     CHOLECYSTECTOMY     IR ABLATE LIVER CRYOABLATION  08/21/2021   IR RADIOLOGIST EVAL & MGMT  08/04/2021   LAPAROSCOPIC GASTRIC BANDING      Allergies: Soy allergy, Peanut-containing drug products, Percocet [oxycodone-acetaminophen], and Shellfish allergy  Medications: Prior to Admission medications   Medication Sig Start Date End Date Taking? Authorizing Provider  albuterol (VENTOLIN HFA) 108 (90 Base) MCG/ACT inhaler Inhale into the lungs every 6 (six) hours as needed for wheezing or shortness of breath.    [provider]  DULoxetine (CYMBALTA) 20 MG capsule TAKE 1 CAPSULE BY MOUTH EVERY DAY 03/04/21   Nche, Bonna Gains, NP  fluticasone (FLONASE) 50 MCG/ACT nasal spray Place 2 sprays into both nostrils daily. 10/01/20   Loyola Mast, MD  fluticasone-salmeterol (ADVAIR DISKUS)  250-50 MCG/ACT AEPB Inhale 1 puff into the lungs in the morning and at bedtime. Rinse mouth after each use 01/22/21   Nche, Charlene Brooke, NP  guaiFENesin (MUCINEX) 600 MG 12 hr tablet Take 2 tablets (1,200 mg total) by mouth 2 (two) times daily. 07/11/21   McElwee, Lauren A, NP  ibuprofen (ADVIL) 800 MG tablet Take 800 mg by mouth 3 (three) times daily. 07/08/21    [provider]  losartan (COZAAR) 25 MG tablet Take 1 tablet (25 mg total) by mouth daily. 09/01/21   Nche, Charlene Brooke, NP  Semaglutide, 2 MG/DOSE, (OZEMPIC, 2 MG/DOSE,) 8 MG/3ML SOPN Inject 2 mg into the skin once a week. 09/01/21   Nche, Charlene Brooke, NP     Family History  Problem Relation Age of Onset   Diabetes Mother    Hypertension Mother    Diabetes Father    Hypertension Father     Social History   Socioeconomic History   Marital status: Married    Spouse name: Not on file   Number of children: Not on file   Years of education: Not on file   Highest education level: Not on file  Occupational History   Not on file  Tobacco Use   Smoking status: Never   Smokeless tobacco: Never  Vaping Use   Vaping Use: Never used  Substance and Sexual Activity   Alcohol use: Not Currently   Drug use: Never   Sexual activity: Yes  Other Topics Concern   Not on file  Social History Narrative   Not on file   Social Determinants of Health   Financial Resource Strain: Not on file  Food Insecurity: Not on file  Transportation Needs: Not on file  Physical Activity: Not on file  Stress: Not on file  Social Connections: Not on file    ECOG Status: 1 - Symptomatic but completely ambulatory  Review of Systems  Review of Systems: A 12 point ROS discussed and pertinent positives are indicated in the HPI above.  All other systems are negative.    Physical Exam No direct physical exam was performed (except for noted visual exam findings with Video Visits).   Vital Signs: LMP 06/15/2010   Imaging: IR PERIPHERAL NERVE CRYOABLATION KNEE LEFT  Result Date: 08/22/2021 INDICATION: Left sided knee pain. Please refer to formal consultation in the [facility] for additional details. EXAM: 1. ULTRASOUND-GUIDED CRYONEUROLYSIS (IOVERA) OF THE INTERMEDIATE DIVISION OF THE ANTERIOR FEMORAL CUTANEOUS NERVE (IAFCN) 2. ULTRASOUND-GUIDED CRYONEUROLYSIS (IOVERA) OF THE MEDIAL  DIVISION OF THE ANTERIOR FEMORAL CUTANEOUS NERVE (MAFCN) 3. ULTRASOUND-GUIDED CRYONEUROLYSIS (IOVERA) OF THE INFRAPATELLAR BRANCH OF THE SAPHENOUS NERVE (ISN) COMPARISON:  None Available. MEDICATIONS: None ANESTHESIA/SEDATION: None FLUOROSCOPY TIME:  None COMPLICATIONS: None immediate. TECHNIQUE: Informed written consent was obtained from the patient after a thorough discussion of the procedural risks, benefits and alternatives. All questions were addressed. A timeout was performed prior to the initiation of the procedure. The patient was positioned supine and sonographic evaluation of the affected thigh and knee was performed by the dictating interventional radiologist. Selected nerves were marked with a skin pen and the procedure was planned. Attention was first paid towards cryoablation of the intermediate division of the anterior femoral cutaneous nerve (IAFCN). The skin overlying the selected dermal access site was cleaned with chlorhexidine. Local anesthesia was provided with a combination of topical anesthetic spray followed by subcutaneous administration 1% lidocaine with epinephrine. Next, under direct ultrasound guidance, the 20 gauge Smart Tip 190 IOVERA cryoneurolysis  probe was advanced with tip ultimately positioned immediately adjacent to the targeted nerve. Multiple ultrasound images were saved for procedural documentation purposes. The cryoneurolysis was then performed for 1 minute, 45 seconds for a total of 1 cycle(s) with intermittent sonographer evaluation. The identical procedure was repeated targeting the medial division of the anterior femoral cutaneous nerve (MAFCN), and the infrapatellar branch of the saphenous nerve (ISN). The cryoneurolysis was performed for each of the above nerve for 1 minute, 45 seconds for a total of 1 cycle each, except the ISN which was treated for 2 cycles, all with intermittent sonographer evaluation. Multiple ultrasound images were saved for procedural  documentation purposes. Following completion of the procedure, sensation about the anterior aspect of the knee was assessed and a postprocedural pain scale was obtained. Dressings were applied. The patient tolerated the procedure well without immediate postprocedural complication. FINDINGS: Sonographic guidance confirms appropriate positioning of the Smart Tip 190 IOVERA cryoneurolysis probe immediately adjacent to the targeted nerves with reduction in patient's postprocedural pain scale as follows: Pre procedural WOMAC score: 75/96 Pre procedural knee pain: At rest: 4-5/10 With activity: 8-10/10 Post procedural knee pain: At rest: 2/10 With activity: 3/10 IMPRESSION: Technically successful cryoneurolysis (IOVERA) of the [facility], MAFCN, and the ISN for left sided knee pain with clinically significant reduction in postprocedural pain scale. Electronically Signed   By: Simonne Come M.D.   On: 08/22/2021 07:57    Labs:  CBC: Recent Labs    01/20/21 1258 03/11/21 0920  WBC 6.7 5.9  HGB 12.1 12.4  HCT 37.9 38.0  PLT 286.0 252.0    COAGS: No results for input(s): "INR", "APTT" in the last 8760 hours.  BMP: Recent Labs    01/20/21 1258 03/11/21 0920  NA 141 139  K 3.4* 4.0  CL 104 101  CO2 30 31  GLUCOSE 93 133*  BUN 13 10  CALCIUM 9.1 9.5  CREATININE 0.81 0.77    LIVER FUNCTION TESTS: Recent Labs    03/11/21 0920  BILITOT 0.4  AST 17  ALT 13  ALKPHOS 93  PROT 7.5  ALBUMIN 3.8    TUMOR MARKERS: No results for input(s): "AFPTM", "CEA", "CA199", "CHROMGRNA" in the last 8760 hours.  Assessment and Plan:  Veronica [name] is a 51 y.o. female with past medical history significant for asthma, GERD and obesity (post laparoscopic gastric banding), who is seen today in telemedicine consultation following technically successful left-sided iovera therapy performed 08/21/2021.   Prior to the procedure, the patient rated her left-sided knee pain as 5 / 10 at rest and 10 / 10 with  activity  For this, the patient underwent technically successful left-sided knee iovera therapy with immediate postprocedural pain scale ratings as follows: At rest: 2/10. With activity: 3/10  Patient states that she has experienced a successful and durable result since undergoing the iovera therapy with pain scales as follows: At rest: 1/10 dull pain, less sharp pain With activity: 2/10  Patient is overall very pleased with the procedure and wishes to pursue iovera therapy of her right knee, preferably next week, as she is going away for the July 4th holiday.  I reiterated to the patient that the procedure is performed as an outpatient basis at [facility].  The procedure is performed soley with local anesthesia, and therefore the patient does need to be NPO, there is no postprocedural recovery and the patient does not need a driver home.  Additionally, the patient does not have to hold any other  their medications, including anticoagulation.  On the day of the procedure, the patient is encouraged to wear either shorts or loose fitting pants that can be rolled up to the upper thigh.     As such, pending insurance approval, this procedure would be scheduled at [facility] at the next earliest convenience (ideally this coming Wednesday, 6/28).  The patient knows to call the interventional radiology clinic with any interval questions or concerns  PLAN: - Obtain insurance approval and scheduled right knee IOVERA Rx at [facility] at the next earliest convenience (ideally this coming Wednesday, 6/28).  A copy of this report was sent to the requesting provider on this date.  Electronically Signed: Sandi Mariscal 09/11/2021, 9:10 AM   I spent a total of 10 Minutes in remote  clinical consultation, greater than 50% of which was counseling/coordinating care for bilateral knee pain  Visit type: Audio only (telephone). Audio (no video) only due to patient's lack of  internet/smartphone capability. Alternative for in-person consultation at [facility], [address] [place], [place], [place]. This visit type was conducted due to national recommendations for restrictions regarding the COVID-19 Pandemic (e.g. social distancing).  This format is felt to be most appropriate for this patient at this time.  All issues noted in this document were discussed and addressed.

## 2021-09-11 NOTE — Assessment & Plan Note (Signed)
Physiologic OU 

## 2021-09-11 NOTE — Progress Notes (Signed)
09/11/2021     CHIEF COMPLAINT Patient presents for  Chief Complaint  Patient presents with   Diabetic Eye Exam      HISTORY OF PRESENT ILLNESS: Veronica [name] is a 51 y.o. female who presents to the clinic today for:   HPI     Diabetic Eye Exam           Vision: is stable   Associated Symptoms: Negative for Flashes, Floaters, Distortion, Blind Spot, Pain and Photophobia   Diabetes Type: Type 2   Onset: 2 months ago   Last A1C: 8.4 (07/01/2021)         Comments   NP- DEE referred by PCP ( notes have been reviewed by Dr Luciana Axe). Patient reports "dependent on the font of the letters, and the size, my vision has deteriorated over the years. Since I have been diagnosed with Diabetes I haven't noticed any vision changes. I just got put on Ozempic so I am losing weight and I dramatically changed eating things with sugar."  Denies FOL or floaters.       Last edited by Nelva Nay on 09/11/2021  9:14 AM.      Referring physician: Anne Ng, NP [address] [place],  [place] [address]  HISTORICAL INFORMATION:   Selected notes from the MEDICAL RECORD NUMBER    Lab Results  Component Value Date   HGBA1C 8.4 (H) 07/01/2021     CURRENT MEDICATIONS: No current outpatient medications on file. (Ophthalmic Drugs)   No current facility-administered medications for this visit. (Ophthalmic Drugs)   Current Outpatient Medications (Other)  Medication Sig   albuterol (VENTOLIN HFA) 108 (90 Base) MCG/ACT inhaler Inhale into the lungs every 6 (six) hours as needed for wheezing or shortness of breath.   DULoxetine (CYMBALTA) 20 MG capsule TAKE 1 CAPSULE BY MOUTH EVERY DAY   fluticasone (FLONASE) 50 MCG/ACT nasal spray Place 2 sprays into both nostrils daily.   fluticasone-salmeterol (ADVAIR DISKUS) 250-50 MCG/ACT AEPB Inhale 1 puff into the lungs in the morning and at bedtime. Rinse mouth after each use   guaiFENesin (MUCINEX) 600 MG 12 hr tablet Take 2  tablets (1,200 mg total) by mouth 2 (two) times daily.   ibuprofen (ADVIL) 800 MG tablet Take 800 mg by mouth 3 (three) times daily.   losartan (COZAAR) 25 MG tablet Take 1 tablet (25 mg total) by mouth daily.   Semaglutide, 2 MG/DOSE, (OZEMPIC, 2 MG/DOSE,) 8 MG/3ML SOPN Inject 2 mg into the skin once a week.   No current facility-administered medications for this visit. (Other)      REVIEW OF SYSTEMS: ROS   Negative for: Constitutional, Gastrointestinal, Neurological, Skin, Genitourinary, Musculoskeletal, HENT, Endocrine, Cardiovascular, Eyes, Respiratory, Psychiatric, Allergic/Imm, Heme/Lymph Last edited by Edmon Crape, MD on 09/11/2021  9:37 AM.       ALLERGIES Allergies  Allergen Reactions   Soy Allergy Anaphylaxis   Peanut-Containing Drug Products    Percocet [Oxycodone-Acetaminophen]    Shellfish Allergy     PAST MEDICAL HISTORY Past Medical History:  Diagnosis Date   Asthma    GERD (gastroesophageal reflux disease)    Past Surgical History:  Procedure Laterality Date   ABDOMINAL HYSTERECTOMY     CHOLECYSTECTOMY     IR ABLATE LIVER CRYOABLATION  08/21/2021   IR RADIOLOGIST EVAL & MGMT  08/04/2021   LAPAROSCOPIC GASTRIC BANDING      FAMILY HISTORY Family History  Problem Relation Age of Onset   Diabetes Mother  Hypertension Mother    Diabetes Father    Hypertension Father     SOCIAL HISTORY Social History   Tobacco Use   Smoking status: Never   Smokeless tobacco: Never  Vaping Use   Vaping Use: Never used  Substance Use Topics   Alcohol use: Not Currently   Drug use: Never         OPHTHALMIC EXAM:  Base Eye Exam     Visual Acuity (ETDRS)       Right Left   Dist [place] 20/20 20/20         Tonometry (Tonopen, 9:18 AM)       Right Left   Pressure 15 15         Pupils       Pupils Dark Light APD   Right PERRL 4 3 None   Left PERRL 4 3 None         Visual Fields (Counting fingers)       Left Right    Full Full          Extraocular Movement       Right Left    Full Full         Neuro/Psych     Oriented x3: Yes   Mood/Affect: Normal         Dilation     Both eyes: 1.0% Mydriacyl, 2.5% Phenylephrine @ 9:18 AM           Slit Lamp and Fundus Exam     External Exam       Right Left   External Normal Normal         Slit Lamp Exam       Right Left   Lids/Lashes Normal Normal   Conjunctiva/Sclera White and quiet White and quiet   Cornea Clear Clear   Anterior Chamber Deep and quiet Deep and quiet   Iris Round and reactive Round and reactive   Lens Trace Nuclear sclerosis Trace Nuclear sclerosis   Anterior Vitreous Normal Normal         Fundus Exam       Right Left   Posterior Vitreous Normal Normal   Disc Normal Normal   C/D Ratio 0.35 0.35   Macula Normal Normal   Vessels no DR no DR   Periphery Normal Normal            IMAGING AND PROCEDURES  Imaging and Procedures for 09/11/21  OCT, Retina - OU - Both Eyes       Right Eye Quality was good. Scan locations included subfoveal. Central Foveal Thickness: 219. Progression has no prior data. Findings include normal foveal contour, vitreomacular adhesion .   Left Eye Quality was good. Scan locations included subfoveal. Central Foveal Thickness: 222. Progression has no prior data. Findings include normal foveal contour, vitreomacular adhesion .      Color Fundus Photography Optos - OU - Both Eyes       Right Eye Progression has no prior data. Disc findings include normal observations. Macula : normal observations. Vessels : normal observations. Periphery : normal observations.   Left Eye Progression has no prior data. Disc findings include normal observations. Vessels : normal observations. Periphery : normal observations.   Notes No detectable diabetic retinopathy             ASSESSMENT/PLAN:  Type 2 diabetes mellitus with hyperglycemia, without long-term current use of insulin (HCC) OU no  DR  Vitreomacular adhesion of both eyes Physiologic OU  ICD-10-CM   1. Vitreomacular adhesion of both eyes  H43.823 OCT, Retina - OU - Both Eyes    Color Fundus Photography Optos - OU - Both Eyes    2. Diabetes mellitus without complication (HCC)  E11.9 Color Fundus Photography Optos - OU - Both Eyes    3. Type 2 diabetes mellitus with hyperglycemia, without long-term current use of insulin (HCC)  E11.65       No DR OU,,,,  2.  3.  Ophthalmic Meds Ordered this visit:  No orders of the defined types were placed in this encounter.      Return in about 2 years (around 09/12/2023) for DILATE OU, OCT.  There are no Patient Instructions on file for this visit.   Explained the diagnoses, plan, and follow up with the patient and they expressed understanding.  Patient expressed understanding of the importance of proper follow up care.   Alford Highland Kaleesi Guyton M.D. Diseases & Surgery of the Retina and Vitreous [facility] 09/11/21     Abbreviations: M myopia (nearsighted); A astigmatism; H hyperopia (farsighted); P presbyopia; Mrx spectacle prescription;  CTL contact lenses; OD right eye; OS left eye; OU both eyes  XT exotropia; ET esotropia; PEK punctate epithelial keratitis; PEE punctate epithelial erosions; DES dry eye syndrome; MGD meibomian gland dysfunction; ATs artificial tears; PFAT's preservative free artificial tears; NSC nuclear sclerotic cataract; PSC posterior subcapsular cataract; ERM epi-retinal membrane; PVD posterior vitreous detachment; RD retinal detachment; DM diabetes mellitus; DR diabetic retinopathy; NPDR non-proliferative diabetic retinopathy; PDR proliferative diabetic retinopathy; CSME clinically significant macular edema; DME diabetic macular edema; dbh dot blot hemorrhages; CWS cotton wool spot; POAG primary open angle glaucoma; C/D cup-to-disc ratio; HVF humphrey visual field; GVF goldmann visual field; OCT optical coherence tomography; IOP  intraocular pressure; BRVO Branch retinal vein occlusion; CRVO central retinal vein occlusion; CRAO central retinal artery occlusion; BRAO branch retinal artery occlusion; RT retinal tear; SB scleral buckle; PPV pars plana vitrectomy; VH Vitreous hemorrhage; PRP panretinal laser photocoagulation; IVK intravitreal kenalog; VMT vitreomacular traction; MH Macular hole;  NVD neovascularization of the disc; NVE neovascularization elsewhere; AREDS age related eye disease study; ARMD age related macular degeneration; POAG primary open angle glaucoma; EBMD epithelial/anterior basement membrane dystrophy; ACIOL anterior chamber intraocular lens; IOL intraocular lens; PCIOL posterior chamber intraocular lens; Phaco/IOL phacoemulsification with intraocular lens placement; PRK photorefractive keratectomy; LASIK laser assisted in situ keratomileusis; HTN hypertension; DM diabetes mellitus; COPD chronic obstructive pulmonary disease

## 2021-09-11 NOTE — Assessment & Plan Note (Signed)
OU no DR

## 2021-09-15 ENCOUNTER — Other Ambulatory Visit (HOSPITAL_COMMUNITY): Payer: Self-pay | Admitting: Interventional Radiology

## 2021-09-15 DIAGNOSIS — M25562 Pain in left knee: Secondary | ICD-10-CM

## 2021-09-16 ENCOUNTER — Ambulatory Visit: Payer: BC Managed Care – PPO | Admitting: Obstetrics & Gynecology

## 2021-09-16 ENCOUNTER — Encounter: Payer: Self-pay | Admitting: Obstetrics & Gynecology

## 2021-09-16 ENCOUNTER — Other Ambulatory Visit (HOSPITAL_COMMUNITY)
Admission: RE | Admit: 2021-09-16 | Discharge: 2021-09-16 | Disposition: A | Discharge status: Home / Self Care | Payer: BC Managed Care – PPO | Source: Ambulatory Visit | Attending: Family Medicine | Admitting: Family Medicine

## 2021-09-16 VITALS — BP 128/87 | HR 94 | Wt 297.0 lb

## 2021-09-16 DIAGNOSIS — Q5210 Doubling of vagina, unspecified: Secondary | ICD-10-CM

## 2021-09-16 DIAGNOSIS — Z7689 Persons encountering health services in other specified circumstances: Secondary | ICD-10-CM

## 2021-09-16 DIAGNOSIS — Z124 Encounter for screening for malignant neoplasm of cervix: Secondary | ICD-10-CM | POA: Insufficient documentation

## 2021-09-16 DIAGNOSIS — N951 Menopausal and female climacteric states: Secondary | ICD-10-CM

## 2021-09-17 ENCOUNTER — Other Ambulatory Visit (HOSPITAL_COMMUNITY): Payer: Self-pay | Admitting: Interventional Radiology

## 2021-09-17 ENCOUNTER — Ambulatory Visit (HOSPITAL_COMMUNITY)
Admission: RE | Admit: 2021-09-17 | Discharge: 2021-09-17 | Disposition: A | Discharge status: Home / Self Care | Payer: BC Managed Care – PPO | Source: Ambulatory Visit | Attending: Interventional Radiology | Admitting: Interventional Radiology

## 2021-09-17 DIAGNOSIS — M25561 Pain in right knee: Secondary | ICD-10-CM

## 2021-09-17 HISTORY — PX: IR ABLATE LIVER CRYOABLATION: IMG5524

## 2021-09-17 MED ORDER — LIDOCAINE-EPINEPHRINE 1 %-1:100000 IJ SOLN
INTRAMUSCULAR | Status: AC
  Filled 2021-09-17: qty 1

## 2021-09-17 MED ORDER — LIDOCAINE-EPINEPHRINE 1 %-1:100000 IJ SOLN
INTRAMUSCULAR | Status: DC | PRN
  Administered 2021-09-17: 20 mL

## 2021-09-17 NOTE — Procedures (Signed)
Pre procedural Dx: Right knee pain Post procedural Dx: Same  Technically successful cryoneurolysis (IOVERA) of the IAFCN, MAFCN, LFCN and the ISN for right sided knee pain with clinically significant reduction in postprocedural pain scale.  Technically successful repeat cryoneurolysis (IOVERA) of the left ISN for residual sensation following recent left sided IOVERA treatment performed on 08/21/2021.   EBL: Trace Complications: None immediate  Pre procedural WOMAC score: 60/96  Pre procedural knee pain: At rest: 3/10 With activity: 5-6/10  Post procedural knee pain: At rest: 2/10 With activity: 2/10  Katherina Right, MD Pager #: [phone]

## 2021-09-18 LAB — CYTOLOGY - PAP
Comment: NEGATIVE
Diagnosis: NEGATIVE
High risk HPV: NEGATIVE

## 2021-09-19 ENCOUNTER — Encounter: Payer: Self-pay | Admitting: Obstetrics & Gynecology

## 2021-09-19 ENCOUNTER — Other Ambulatory Visit: Payer: Self-pay | Admitting: Nurse Practitioner

## 2021-09-19 DIAGNOSIS — F411 Generalized anxiety disorder: Secondary | ICD-10-CM

## 2021-09-19 NOTE — Telephone Encounter (Signed)
Chart Supports Rx Last OV: 08/2021 Next OV: 09/2021

## 2021-09-29 ENCOUNTER — Encounter: Payer: Self-pay | Admitting: Nurse Practitioner

## 2021-09-29 ENCOUNTER — Encounter: Payer: Self-pay | Admitting: Gastroenterology

## 2021-09-29 ENCOUNTER — Ambulatory Visit: Payer: BC Managed Care – PPO | Admitting: Nurse Practitioner

## 2021-09-29 ENCOUNTER — Ambulatory Visit: Payer: BC Managed Care – PPO | Admitting: Gastroenterology

## 2021-09-29 VITALS — BP 120/78 | HR 88 | Ht 62.25 in | Wt 298.2 lb

## 2021-09-29 VITALS — BP 136/90 | HR 80 | Temp 97.0°F | Ht 64.0 in | Wt 296.6 lb

## 2021-09-29 DIAGNOSIS — E785 Hyperlipidemia, unspecified: Secondary | ICD-10-CM

## 2021-09-29 DIAGNOSIS — E1169 Type 2 diabetes mellitus with other specified complication: Secondary | ICD-10-CM | POA: Diagnosis not present

## 2021-09-29 DIAGNOSIS — I10 Essential (primary) hypertension: Secondary | ICD-10-CM

## 2021-09-29 DIAGNOSIS — Z86010 Personal history of colonic polyps: Secondary | ICD-10-CM | POA: Diagnosis not present

## 2021-09-29 DIAGNOSIS — E1165 Type 2 diabetes mellitus with hyperglycemia: Secondary | ICD-10-CM | POA: Diagnosis not present

## 2021-09-29 LAB — LIPID PANEL
Cholesterol: 180 mg/dL (ref 0–200)
HDL: 52 mg/dL (ref >39.00)
LDL Cholesterol: 114 mg/dL — ABNORMAL HIGH (ref 0–99)
NonHDL: 128.47
Total CHOL/HDL Ratio: 3
Triglycerides: 73 mg/dL (ref 0.0–149.0)
VLDL: 14.6 mg/dL (ref 0.0–40.0)

## 2021-09-29 LAB — COMPREHENSIVE METABOLIC PANEL
ALT: 12 U/L (ref 0–35)
AST: 14 U/L (ref 0–37)
Albumin: 4 g/dL (ref 3.5–5.2)
Alkaline Phosphatase: 86 U/L (ref 39–117)
BUN: 15 mg/dL (ref 6–23)
CO2: 31 mEq/L (ref 19–32)
Calcium: 9.5 mg/dL (ref 8.4–10.5)
Chloride: 102 mEq/L (ref 96–112)
Creatinine, Ser: 0.81 mg/dL (ref 0.40–1.20)
GFR: 82.85 mL/min (ref >60.00)
Glucose, Bld: 82 mg/dL (ref 70–99)
Potassium: 3.5 mEq/L (ref 3.5–5.1)
Sodium: 141 mEq/L (ref 135–145)
Total Bilirubin: 0.4 mg/dL (ref 0.2–1.2)
Total Protein: 7.4 g/dL (ref 6.0–8.3)

## 2021-09-29 LAB — HEMOGLOBIN A1C: Hgb A1c MFr Bld: 6.8 % — ABNORMAL HIGH (ref 4.6–6.5)

## 2021-09-29 MED ORDER — ATORVASTATIN CALCIUM 20 MG PO TABS: 20.0000 mg | ORAL_TABLET | Freq: Every day | ORAL | 3 refills | Status: DC

## 2021-09-29 NOTE — Progress Notes (Signed)
Established Patient Visit  Patient: Veronica [name]   DOB: 10/14/1970   51 y.o. Female  MRN: [id number] Visit Date: 09/29/2021  Subjective:    Chief Complaint  Patient presents with   Office Visit    4 week f/u HTN/ DM/ Hyperlipidemia Has appt at GI today to discuss colonoscopy No concerns   HPI HTN (hypertension), benign Elevated diastolic, but SBP at goal No BP check at home Compliant with losartan 25mg  BP Readings from Last 3 Encounters:  09/29/21 120/78  09/29/21 136/90  09/16/21 128/87   Consider increase losartan dose to 50mg  if home BP >130/80. Advised to check BP at home in AM and send readings in 1week.  Type 2 diabetes mellitus with hyperglycemia, without long-term current use of insulin (HCC) Repeat hgbA1c: improved 8.6 to 6.4% No adverse effects with ozempic 2mg  Normal Dm foot exam today Up to date with eye exam: normal maintain ozempic dose  Hyperlipidemia associated with type 2 diabetes mellitus (HCC) Repeat lipid panel: improved LDL but not at goal 41yrs ASCVD risk is at 12%. recommend use of atorvastatin 20mg  daily?  Wt Readings from Last 3 Encounters:  09/29/21 298 lb 4 oz (135.3 kg)  09/29/21 296 lb 9.6 oz (134.5 kg)  09/16/21 297 lb (134.7 kg)     Reviewed medical, surgical, and social history today  Medications: Outpatient Medications Prior to Visit  Medication Sig   albuterol (VENTOLIN HFA) 108 (90 Base) MCG/ACT inhaler Inhale into the lungs every 6 (six) hours as needed for wheezing or shortness of breath.   DULoxetine (CYMBALTA) 20 MG capsule TAKE 1 CAPSULE BY MOUTH EVERY DAY   fluticasone (FLONASE) 50 MCG/ACT nasal spray Place 2 sprays into both nostrils daily.   ibuprofen (ADVIL) 800 MG tablet Take 800 mg by mouth 3 (three) times daily.   losartan (COZAAR) 25 MG tablet Take 1 tablet (25 mg total) by mouth daily.   montelukast (SINGULAIR) 10 MG tablet Take 10 mg by mouth at bedtime.   Semaglutide, 2 MG/DOSE, (OZEMPIC, 2  MG/DOSE,) 8 MG/3ML SOPN Inject 2 mg into the skin once a week.   [DISCONTINUED] fluticasone-salmeterol (ADVAIR DISKUS) 250-50 MCG/ACT AEPB Inhale 1 puff into the lungs in the morning and at bedtime. Rinse mouth after each use (Patient not taking: Reported on 09/29/2021)   No facility-administered medications prior to visit.   Reviewed past medical and social history.   ROS per HPI above      Objective:  BP 136/90 (BP Location: Right Arm, Patient Position: Sitting, Cuff Size: Normal)   Pulse 80   Temp (!) 97 F (36.1 C) (Temporal)   Ht 5\' 4"  (1.626 m)   Wt 296 lb 9.6 oz (134.5 kg)   LMP 06/15/2010   SpO2 94%   BMI 50.91 kg/m      Physical Exam Constitutional:      Appearance: She is obese.  Cardiovascular:     Rate and Rhythm: Normal rate and regular rhythm.     Pulses: Normal pulses.     Heart sounds: Normal heart sounds.  Pulmonary:     Effort: Pulmonary effort is normal.     Breath sounds: Normal breath sounds.  Musculoskeletal:     Right lower leg: No edema.     Left lower leg: No edema.  Neurological:     Mental Status: She is alert and oriented to person, place, and time.  Psychiatric:  Mood and Affect: Mood normal.        Behavior: Behavior normal.        Thought Content: Thought content normal.     Results for orders placed or performed in visit on 09/29/21  Hemoglobin A1c  Result Value Ref Range   Hgb A1c MFr Bld 6.8 (H) 4.6 - 6.5 %  Lipid panel  Result Value Ref Range   Cholesterol 180 0 - 200 mg/dL   Triglycerides 76.2 0.0 - 149.0 mg/dL   HDL 83.15 >17.61 mg/dL   VLDL 60.7 0.0 - 37.1 mg/dL   LDL Cholesterol 062 (H) 0 - 99 mg/dL   Total CHOL/HDL Ratio 3    NonHDL 128.47   Comprehensive metabolic panel  Result Value Ref Range   Sodium 141 135 - 145 mEq/L   Potassium 3.5 3.5 - 5.1 mEq/L   Chloride 102 96 - 112 mEq/L   CO2 31 19 - 32 mEq/L   Glucose, Bld 82 70 - 99 mg/dL   BUN 15 6 - 23 mg/dL   Creatinine, Ser 6.94 0.40 - 1.20 mg/dL    Total Bilirubin 0.4 0.2 - 1.2 mg/dL   Alkaline Phosphatase 86 39 - 117 U/L   AST 14 0 - 37 U/L   ALT 12 0 - 35 U/L   Total Protein 7.4 6.0 - 8.3 g/dL   Albumin 4.0 3.5 - 5.2 g/dL   GFR 85.46 >27.03 mL/min   Calcium 9.5 8.4 - 10.5 mg/dL      Assessment & Plan:    Problem List Items Addressed This Visit       Cardiovascular and Mediastinum   HTN (hypertension), benign    Elevated diastolic, but SBP at goal No BP check at home Compliant with losartan 25mg  BP Readings from Last 3 Encounters:  09/29/21 120/78  09/29/21 136/90  09/16/21 128/87   Consider increase losartan dose to 50mg  if home BP >130/80. Advised to check BP at home in AM and send readings in 1week.      Relevant Orders   Comprehensive metabolic panel (Completed)     Endocrine   Hyperlipidemia associated with type 2 diabetes mellitus (HCC)    Repeat lipid panel: improved LDL but not at goal 70yrs ASCVD risk is at 12%. recommend use of atorvastatin 20mg  daily?      Relevant Orders   Lipid panel (Completed)   Type 2 diabetes mellitus with hyperglycemia, without long-term current use of insulin (HCC) - Primary    Repeat hgbA1c: improved 8.6 to 6.4% No adverse effects with ozempic 2mg  Normal Dm foot exam today Up to date with eye exam: normal maintain ozempic dose      Relevant Orders   Hemoglobin A1c (Completed)   Comprehensive metabolic panel (Completed)     Other   Morbid obesity (HCC)   Relevant Orders   Comprehensive metabolic panel (Completed)   Other Visit Diagnoses     Primary hypertension          Return in about 3 months (around 12/30/2021) for DM and HTN, hyperlipidemia.     4yr, NP

## 2021-09-29 NOTE — Patient Instructions (Signed)
If you are age 53 or older, your body mass index should be between 23-30. Your Body mass index is 54.11 kg/m. If this is out of the aforementioned range listed, please consider follow up with your Primary Care Provider.  If you are age 107 or younger, your body mass index should be between 19-25. Your Body mass index is 54.11 kg/m. If this is out of the aformentioned range listed, please consider follow up with your Primary Care Provider.   We will call you to schedule a hospital Colonoscopy once we have the schedule.  The [facility] providers would like to encourage you to use [facility] to communicate with providers for non-urgent requests or questions.  Due to long hold times on the telephone, sending your provider a message by [facility] may be a faster and more efficient way to get a response.  Please allow 48 business hours for a response.  Please remember that this is for non-urgent requests.   It was a pleasure to see you today!  Thank you for trusting me with your gastrointestinal care!    Scott E.Tomasa Rand, MD

## 2021-09-29 NOTE — Assessment & Plan Note (Addendum)
Elevated diastolic, but SBP at goal No BP check at home Compliant with losartan 25mg  BP Readings from Last 3 Encounters:  09/29/21 120/78  09/29/21 136/90  09/16/21 128/87   Consider increase losartan dose to 50mg  if home BP >130/80. Advised to check BP at home in AM and send readings in 1week.

## 2021-09-29 NOTE — Addendum Note (Signed)
Addended by: Michaela Corner on: 09/29/2021 04:41 PM   Modules accepted: Orders

## 2021-09-29 NOTE — Assessment & Plan Note (Addendum)
Repeat lipid panel: improved LDL but not at goal 53yrs ASCVD risk is at 12%.agreed to add atorvastatin 20mg  daily Repeat lab in 56months (fasting)

## 2021-09-29 NOTE — Progress Notes (Signed)
HPI : Veronica [name] is a very pleasant 51 year old female with a history of morbid obesity, diabetes, asthma and arthritis who is referred to our office by Veronica Penna, NP for surveillance colonoscopy.  The patient was initially seen in our office by Dr. Orvan Falconer in September 2021.  At that time she was having significant symptoms of nausea and vomiting which were thought to be related to her gastric lap band.  She followed up with bariatric surgery, and had the fluid from the band removed, and this resolved the symptoms.  She has not followed up with GI since then. She reports having a colonoscopy for 5 years ago when she was living in the [place].  She knows that she had polyps removed, but does not recall the details.  She believes that she was recommended to repeat in 3 to 5 years.  She does not have a colonoscopy report, and has been unable to get the records from the medical facility in the [place].  Previous attempts by our office in 2021 to get records were unsuccessful.  Veronica [name] has occasional constipation, which she says is in part related to the Ozempic she takes.  Usually this is improved with increasing water intake and dietary fiber.  Rarely she will need to take a stool softener at with her bowel movements.  She has a bowel movement on average every 1 to 2 days.  She denies symptoms of diarrhea, abdominal pain or blood in her stool. She has occasional mild reflux symptoms, which are mostly managed with dietary modification.  She also takes a ginger supplement as needed which she feels helps.  She has no family history of colon cancer that she is aware of.   She has no heart problems that she knows of, and denies any symptoms of chest pain or chest pressure, lightheadedness or dizziness.  She has asthma which has been well controlled for the past several years, typically using her inhaler once or twice a week, typically at night.     Past Medical History:  Diagnosis Date    Anxiety    Asthma    DM (diabetes mellitus) (HCC)    GERD (gastroesophageal reflux disease)    HTN (hypertension)    Obesity      Past Surgical History:  Procedure Laterality Date   CESAREAN SECTION     x 3   CHOLECYSTECTOMY     IR ABLATE LIVER CRYOABLATION  08/21/2021   IR ABLATE LIVER CRYOABLATION  08/21/2021   IR ABLATE LIVER CRYOABLATION  08/21/2021   IR ABLATE LIVER CRYOABLATION  09/17/2021   IR ABLATE LIVER CRYOABLATION  09/17/2021   IR ABLATE LIVER CRYOABLATION  09/17/2021   IR RADIOLOGIST EVAL & MGMT  08/04/2021   IR RADIOLOGIST EVAL & MGMT  09/11/2021   LAPAROSCOPIC GASTRIC BANDING     SUPRACERVICAL ABDOMINAL HYSTERECTOMY  2017   Family History  Problem Relation Age of Onset   Diabetes Mother    Hypertension Mother    Asthma Mother    Diabetes Father    Hypertension Father    Throat cancer Father    Asthma Sister    Asthma Brother    Asthma Brother    Pulmonary embolism Brother    Stomach cancer Maternal Grandmother    Lung cancer Maternal Grandfather    Other Daughter        prediabetes   Social History   Tobacco Use   Smoking status: Never   Smokeless tobacco: Never  Vaping Use   Vaping Use: Never used  Substance Use Topics   Alcohol use: Not Currently   Drug use: Never   Current Outpatient Medications  Medication Sig Dispense Refill   albuterol (VENTOLIN HFA) 108 (90 Base) MCG/ACT inhaler Inhale into the lungs every 6 (six) hours as needed for wheezing or shortness of breath.     DULoxetine (CYMBALTA) 20 MG capsule TAKE 1 CAPSULE BY MOUTH EVERY DAY 90 capsule 1   fluticasone (FLONASE) 50 MCG/ACT nasal spray Place 2 sprays into both nostrils daily. 16 g 6   ibuprofen (ADVIL) 800 MG tablet Take 800 mg by mouth 3 (three) times daily.     losartan (COZAAR) 25 MG tablet Take 1 tablet (25 mg total) by mouth daily. 90 tablet 1   montelukast (SINGULAIR) 10 MG tablet Take 10 mg by mouth at bedtime.     Semaglutide, 2 MG/DOSE, (OZEMPIC, 2 MG/DOSE,) 8  MG/3ML SOPN Inject 2 mg into the skin once a week. 15 mL 0   No current facility-administered medications for this visit.   Allergies  Allergen Reactions   Soy Allergy Anaphylaxis   Peanut-Containing Drug Products    Percocet [Oxycodone-Acetaminophen]    Shellfish Allergy      Review of Systems: All systems reviewed and negative except where noted in HPI.    IR PERIPHERAL NERVE CRYOABLATION KNEE RIGHT  Result Date: 09/17/2021 INDICATION: Right sided knee pain. Please refer to formal consultation in the [facility] EPIC EMR for additional details. EXAM: 1. ULTRASOUND-GUIDED CRYONEUROLYSIS (IOVERA) OF THE INTERMEDIATE DIVISION OF THE ANTERIOR FEMORAL CUTANEOUS NERVE (IAFCN) 2. ULTRASOUND-GUIDED CRYONEUROLYSIS (IOVERA) OF THE MEDIAL DIVISION OF THE ANTERIOR FEMORAL CUTANEOUS NERVE (MAFCN) 3. ULTRASOUND-GUIDED CRYONEUROLYSIS (IOVERA) OF THE LATERAL FEMORAL CUTANEOUS NERVE (LFCN) 4. ULTRASOUND-GUIDED CRYONEUROLYSIS (IOVERA) OF THE INFRAPATELLAR BRANCH OF THE SAPHENOUS NERVE (ISN) COMPARISON:  None Available. MEDICATIONS: None ANESTHESIA/SEDATION: None FLUOROSCOPY TIME:  None COMPLICATIONS: None immediate. TECHNIQUE: Informed written consent was obtained from the patient after a thorough discussion of the procedural risks, benefits and alternatives. All questions were addressed. A timeout was performed prior to the initiation of the procedure. The patient was positioned supine and sonographic evaluation of the affected thigh and knee was performed by the dictating interventional radiologist. Selected nerves were marked with a skin pen and the procedure was planned. Attention was first paid towards cryoablation of the intermediate division of the anterior femoral cutaneous nerve (IAFCN). The skin overlying the selected dermal access site was cleaned with chlorhexidine. Local anesthesia was provided with a combination of topical anesthetic spray followed by subcutaneous administration 1% lidocaine with  epinephrine. Next, under direct ultrasound guidance, the 20 gauge Smart Tip 190 IOVERA cryoneurolysis probe was advanced with tip ultimately positioned immediately adjacent to the targeted nerve. Multiple ultrasound images were saved for procedural documentation purposes. The cryoneurolysis was then performed for 1 minute, 45 seconds for a total of 1 cycle(s) with intermittent sonographer evaluation. The identical procedure was repeated targeting the medial division of the anterior femoral cutaneous nerve (MAFCN), the lateral femoral cutaneous nerve (LFCN) and the infrapatellar branch of the saphenous nerve (ISN). The cryoneurolysis was performed for each of the above nerve for 1 minute, 45 seconds for a total of 1 cycle each, except the right ISN which was treated with a total of 3 cycles (per patient request), all with intermittent sonographer evaluation. Multiple ultrasound images were saved for procedural documentation purposes. Note, given patient's persistent sensation about anterior inferior aspect of the contralateral left knee, two attempts were  made to perform cryoneurolysis of the left ISN utilizing the above technique. Following completion of the procedure, sensation about the anterior aspect of the knee was assessed and a postprocedural pain scale was obtained. Dressings were applied. The patient tolerated the procedure well without immediate postprocedural complication. FINDINGS: Sonographic guidance confirms appropriate positioning of the Smart Tip 190 IOVERA cryoneurolysis probe immediately adjacent to the targeted nerves with reduction in patient's postprocedural pain scale as follows: Pre procedural WOMAC score: 60/96 Pre procedural knee pain: At rest: 3/10 With activity: 5-6/10 Post procedural knee pain: At rest: 2/10 With activity: 2/10 IMPRESSION: 1. Technically successful cryoneurolysis (IOVERA) of the IAFCN, MAFCN, LFCN and the ISN for right sided knee pain with clinically significant  reduction in postprocedural pain scale. 2. Technically successful repeat cryoneurolysis (IOVERA) of the left ISN for residual sensation following recent left sided IOVERA treatment performed on 08/21/2021. Electronically Signed   By: Simonne Come M.D.   On: 09/17/2021 13:21   IR PERIPHERAL NERVE CRYOABLATION KNEE RIGHT  Result Date: 09/17/2021 INDICATION: Right sided knee pain. Please refer to formal consultation in the [facility] EPIC EMR for additional details. EXAM: 1. ULTRASOUND-GUIDED CRYONEUROLYSIS (IOVERA) OF THE INTERMEDIATE DIVISION OF THE ANTERIOR FEMORAL CUTANEOUS NERVE (IAFCN) 2. ULTRASOUND-GUIDED CRYONEUROLYSIS (IOVERA) OF THE MEDIAL DIVISION OF THE ANTERIOR FEMORAL CUTANEOUS NERVE (MAFCN) 3. ULTRASOUND-GUIDED CRYONEUROLYSIS (IOVERA) OF THE LATERAL FEMORAL CUTANEOUS NERVE (LFCN) 4. ULTRASOUND-GUIDED CRYONEUROLYSIS (IOVERA) OF THE INFRAPATELLAR BRANCH OF THE SAPHENOUS NERVE (ISN) COMPARISON:  None Available. MEDICATIONS: None ANESTHESIA/SEDATION: None FLUOROSCOPY TIME:  None COMPLICATIONS: None immediate. TECHNIQUE: Informed written consent was obtained from the patient after a thorough discussion of the procedural risks, benefits and alternatives. All questions were addressed. A timeout was performed prior to the initiation of the procedure. The patient was positioned supine and sonographic evaluation of the affected thigh and knee was performed by the dictating interventional radiologist. Selected nerves were marked with a skin pen and the procedure was planned. Attention was first paid towards cryoablation of the intermediate division of the anterior femoral cutaneous nerve (IAFCN). The skin overlying the selected dermal access site was cleaned with chlorhexidine. Local anesthesia was provided with a combination of topical anesthetic spray followed by subcutaneous administration 1% lidocaine with epinephrine. Next, under direct ultrasound guidance, the 20 gauge Smart Tip 190 IOVERA cryoneurolysis probe was  advanced with tip ultimately positioned immediately adjacent to the targeted nerve. Multiple ultrasound images were saved for procedural documentation purposes. The cryoneurolysis was then performed for 1 minute, 45 seconds for a total of 1 cycle(s) with intermittent sonographer evaluation. The identical procedure was repeated targeting the medial division of the anterior femoral cutaneous nerve (MAFCN), the lateral femoral cutaneous nerve (LFCN) and the infrapatellar branch of the saphenous nerve (ISN). The cryoneurolysis was performed for each of the above nerve for 1 minute, 45 seconds for a total of 1 cycle each, except the right ISN which was treated with a total of 3 cycles (per patient request), all with intermittent sonographer evaluation. Multiple ultrasound images were saved for procedural documentation purposes. Note, given patient's persistent sensation about anterior inferior aspect of the contralateral left knee, two attempts were made to perform cryoneurolysis of the left ISN utilizing the above technique. Following completion of the procedure, sensation about the anterior aspect of the knee was assessed and a postprocedural pain scale was obtained. Dressings were applied. The patient tolerated the procedure well without immediate postprocedural complication. FINDINGS: Sonographic guidance confirms appropriate positioning of the Smart Tip 190 IOVERA cryoneurolysis probe immediately  adjacent to the targeted nerves with reduction in patient's postprocedural pain scale as follows: Pre procedural WOMAC score: 60/96 Pre procedural knee pain: At rest: 3/10 With activity: 5-6/10 Post procedural knee pain: At rest: 2/10 With activity: 2/10 IMPRESSION: 1. Technically successful cryoneurolysis (IOVERA) of the IAFCN, MAFCN, LFCN and the ISN for right sided knee pain with clinically significant reduction in postprocedural pain scale. 2. Technically successful repeat cryoneurolysis (IOVERA) of the left ISN for  residual sensation following recent left sided IOVERA treatment performed on 08/21/2021. Electronically Signed   By: Simonne Come M.D.   On: 09/17/2021 13:21   IR PERIPHERAL NERVE CRYOABLATION KNEE RIGHT  Result Date: 09/17/2021 INDICATION: Right sided knee pain. Please refer to formal consultation in the [facility] EPIC EMR for additional details. EXAM: 1. ULTRASOUND-GUIDED CRYONEUROLYSIS (IOVERA) OF THE INTERMEDIATE DIVISION OF THE ANTERIOR FEMORAL CUTANEOUS NERVE (IAFCN) 2. ULTRASOUND-GUIDED CRYONEUROLYSIS (IOVERA) OF THE MEDIAL DIVISION OF THE ANTERIOR FEMORAL CUTANEOUS NERVE (MAFCN) 3. ULTRASOUND-GUIDED CRYONEUROLYSIS (IOVERA) OF THE LATERAL FEMORAL CUTANEOUS NERVE (LFCN) 4. ULTRASOUND-GUIDED CRYONEUROLYSIS (IOVERA) OF THE INFRAPATELLAR BRANCH OF THE SAPHENOUS NERVE (ISN) COMPARISON:  None Available. MEDICATIONS: None ANESTHESIA/SEDATION: None FLUOROSCOPY TIME:  None COMPLICATIONS: None immediate. TECHNIQUE: Informed written consent was obtained from the patient after a thorough discussion of the procedural risks, benefits and alternatives. All questions were addressed. A timeout was performed prior to the initiation of the procedure. The patient was positioned supine and sonographic evaluation of the affected thigh and knee was performed by the dictating interventional radiologist. Selected nerves were marked with a skin pen and the procedure was planned. Attention was first paid towards cryoablation of the intermediate division of the anterior femoral cutaneous nerve (IAFCN). The skin overlying the selected dermal access site was cleaned with chlorhexidine. Local anesthesia was provided with a combination of topical anesthetic spray followed by subcutaneous administration 1% lidocaine with epinephrine. Next, under direct ultrasound guidance, the 20 gauge Smart Tip 190 IOVERA cryoneurolysis probe was advanced with tip ultimately positioned immediately adjacent to the targeted nerve. Multiple ultrasound images were  saved for procedural documentation purposes. The cryoneurolysis was then performed for 1 minute, 45 seconds for a total of 1 cycle(s) with intermittent sonographer evaluation. The identical procedure was repeated targeting the medial division of the anterior femoral cutaneous nerve (MAFCN), the lateral femoral cutaneous nerve (LFCN) and the infrapatellar branch of the saphenous nerve (ISN). The cryoneurolysis was performed for each of the above nerve for 1 minute, 45 seconds for a total of 1 cycle each, except the right ISN which was treated with a total of 3 cycles (per patient request), all with intermittent sonographer evaluation. Multiple ultrasound images were saved for procedural documentation purposes. Note, given patient's persistent sensation about anterior inferior aspect of the contralateral left knee, two attempts were made to perform cryoneurolysis of the left ISN utilizing the above technique. Following completion of the procedure, sensation about the anterior aspect of the knee was assessed and a postprocedural pain scale was obtained. Dressings were applied. The patient tolerated the procedure well without immediate postprocedural complication. FINDINGS: Sonographic guidance confirms appropriate positioning of the Smart Tip 190 IOVERA cryoneurolysis probe immediately adjacent to the targeted nerves with reduction in patient's postprocedural pain scale as follows: Pre procedural WOMAC score: 60/96 Pre procedural knee pain: At rest: 3/10 With activity: 5-6/10 Post procedural knee pain: At rest: 2/10 With activity: 2/10 IMPRESSION: 1. Technically successful cryoneurolysis (IOVERA) of the IAFCN, MAFCN, LFCN and the ISN for right sided knee pain with clinically significant reduction  in postprocedural pain scale. 2. Technically successful repeat cryoneurolysis (IOVERA) of the left ISN for residual sensation following recent left sided IOVERA treatment performed on 08/21/2021. Electronically Signed   By: Simonne ComeJohn   Watts M.D.   On: 09/17/2021 13:21   IR Radiologist Eval & Mgmt  Result Date: 09/11/2021 Please refer to notes tab for details about interventional procedure. (Op Note)  Color Fundus Photography Optos - OU - Both Eyes  Result Date: 09/11/2021 Right Eye Progression has no prior data. Disc findings include normal observations. Macula : normal observations. Vessels : normal observations. Periphery : normal observations. Left Eye Progression has no prior data. Disc findings include normal observations. Vessels : normal observations. Periphery : normal observations. Notes No detectable diabetic retinopathy  OCT, Retina - OU - Both Eyes  Result Date: 09/11/2021 Right Eye Quality was good. Scan locations included subfoveal. Central Foveal Thickness: 219. Progression has no prior data. Findings include normal foveal contour, vitreomacular adhesion . Left Eye Quality was good. Scan locations included subfoveal. Central Foveal Thickness: 222. Progression has no prior data. Findings include normal foveal contour, vitreomacular adhesion .    Physical Exam: BP 120/78 (BP Location: Left Arm, Patient Position: Sitting, Cuff Size: Large)   Pulse 88   Ht 5' 2.25" (1.581 m) Comment: height measured without shoes  Wt 298 lb 4 oz (135.3 kg)   LMP 06/15/2010   BMI 54.11 kg/m  Constitutional: Pleasant, obese, African American female in no acute distress. HEENT: Normocephalic and atraumatic. Conjunctivae are normal. No scleral icterus. Neck supple.  Cardiovascular: Normal rate, regular rhythm.  Pulmonary/chest: Effort normal and breath sounds normal. No wheezing, rales or rhonchi. Abdominal: Soft, nondistended, nontender. Bowel sounds active throughout. There are no masses palpable. No hepatomegaly. Extremities: no edema Neurological: Alert and oriented to person place and time. Skin: Skin is warm and dry. No rashes noted. Psychiatric: Normal mood and affect. Behavior is normal.  CBC    Component Value  Date/Time   WBC 5.9 03/11/2021 0920   RBC 4.55 03/11/2021 0920   HGB 12.4 03/11/2021 0920   HCT 38.0 03/11/2021 0920   PLT 252.0 03/11/2021 0920   MCV 83.4 03/11/2021 0920   MCHC 32.5 03/11/2021 0920   RDW 16.0 (H) 03/11/2021 0920   LYMPHSABS 1.9 03/11/2021 0920   MONOABS 0.5 03/11/2021 0920   EOSABS 0.1 03/11/2021 0920   BASOSABS 0.0 03/11/2021 0920    CMP     Component Value Date/Time   NA 139 03/11/2021 0920   K 4.0 03/11/2021 0920   CL 101 03/11/2021 0920   CO2 31 03/11/2021 0920   GLUCOSE 133 (H) 03/11/2021 0920   BUN 10 03/11/2021 0920   CREATININE 0.77 03/11/2021 0920   CALCIUM 9.5 03/11/2021 0920   PROT 7.5 03/11/2021 0920   ALBUMIN 3.8 03/11/2021 0920   AST 17 03/11/2021 0920   ALT 13 03/11/2021 0920   ALKPHOS 93 03/11/2021 0920   BILITOT 0.4 03/11/2021 0920     ASSESSMENT AND PLAN: 51 year old female with morbid obesity and reported history of colon polyps on colonoscopy 4 to 5 years ago, due for surveillance colonoscopy.  The patient does not think secondary possible to get her previous colonoscopy report to verify that she is due.  Previous attempts of our office to get these reports have also been unsuccessful.  We will therefore plan on proceeding with a routine surveillance colonoscopy.  She does not have any concerning GI symptoms and no family history of colon cancer.  She is morbidly  obese and has well-controlled asthma, but no other concerning comorbidities or symptoms.  Due to her BMI greater than 50, she will need to have her colonoscopy performed in the hospital setting.  As the patient was originally seen by Dr. Orvan Falconer, we will schedule her colonoscopy with Dr. Orvan Falconer.  Due to the paucity of hospital endoscopy slots, the patient will likely be placed on a waiting list (depends on Dr. Orvan Falconer hospital availability).  Personal history of polyps - Surveillance colonoscopy  The details, risks (including bleeding, perforation, infection, missed lesions,  medication reactions and possible hospitalization or surgery if complications occur), benefits, and alternatives to colonoscopy with possible biopsy and possible polypectomy were discussed with the patient and she consents to proceed.   Lakyra Tippins E. Tomasa Rand, MD [facility]  CC:  Nche, Bonna Gains, NP

## 2021-09-29 NOTE — Patient Instructions (Signed)
Monitor BP at home in Am send BP readings via mychart in 1week.

## 2021-09-29 NOTE — Assessment & Plan Note (Addendum)
Repeat hgbA1c: improved 8.6 to 6.4% No adverse effects with ozempic 2mg  Normal Dm foot exam today Up to date with eye exam: normal maintain ozempic dose

## 2021-10-07 ENCOUNTER — Other Ambulatory Visit: Payer: Self-pay | Admitting: Interventional Radiology

## 2021-10-07 DIAGNOSIS — G8929 Other chronic pain: Secondary | ICD-10-CM

## 2021-10-13 ENCOUNTER — Ambulatory Visit
Admission: RE | Admit: 2021-10-13 | Discharge: 2021-10-13 | Disposition: A | Discharge status: Home / Self Care | Payer: BC Managed Care – PPO | Source: Ambulatory Visit | Attending: Interventional Radiology | Admitting: Interventional Radiology

## 2021-10-13 DIAGNOSIS — Z9889 Other specified postprocedural states: Secondary | ICD-10-CM | POA: Diagnosis not present

## 2021-10-13 DIAGNOSIS — G8929 Other chronic pain: Secondary | ICD-10-CM

## 2021-10-13 DIAGNOSIS — M25562 Pain in left knee: Secondary | ICD-10-CM | POA: Diagnosis not present

## 2021-10-13 DIAGNOSIS — M25561 Pain in right knee: Secondary | ICD-10-CM | POA: Diagnosis not present

## 2021-10-13 HISTORY — PX: IR RADIOLOGIST EVAL & MGMT: IMG5224

## 2021-10-13 NOTE — Progress Notes (Signed)
Patient ID: Veronica [name], female   DOB: 1970-10-12, 51 y.o.   MRN: [id number]        Chief Complaint: Bilateral knee pain, post IOVERA of left knee on 6/1 and IOVERA of the right knee on 6/28  Referring Physician(s): Dalldorf,Peter  History of Present Illness: Veronica [name] is a 51 y.o. female with past medical history significant for asthma, GERD and obesity (post laparoscopic gastric banding), who is seen today in telemedicine consultation following technically successful left-sided IOVERA therapy performed 08/21/2021 and IOVERA of the right knee on 6/28.  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  Specifically, she has to use a electric cart when grocery shopping.  Most recently the patient underwent cryoneurolysis (IOVERA) of the [facility], MAFCN, LFCN and the ISN for right sided knee pain on 6/28 as well as repeat cryoneurolysis (IOVERA) of the left ISN for residual sensation following recent left sided IOVERA treatment initially performed on 08/21/2021.    Prior to her RIGHT sided IOVERA Rx, her pain scales were as follows:   Pre procedural WOMAC score: 60/96   Pre procedural knee pain: At rest: 3/10 With activity: 5-6/10  Following the right sided IOVERA, she rates her knee pain as follows: At rest: 2/10 With activity: 2/10  Presently, she rates her pain as follows: At rest: 3/10 With activity: 5-6/10  Unfortunately, while the patient had a successful and durable result with cryoneurolysis of the left knee, she states her knee is numb at all locations except her knee cap, where the majority of her pain resides.     Past Medical History:  Diagnosis Date   Anxiety    Asthma    DM (diabetes mellitus) (HCC)    GERD (gastroesophageal reflux disease)    HTN (hypertension)    Obesity     Past Surgical History:  Procedure Laterality Date   CESAREAN SECTION     x 3   CHOLECYSTECTOMY     IR ABLATE LIVER CRYOABLATION   08/21/2021   IR ABLATE LIVER CRYOABLATION  08/21/2021   IR ABLATE LIVER CRYOABLATION  08/21/2021   IR ABLATE LIVER CRYOABLATION  09/17/2021   IR ABLATE LIVER CRYOABLATION  09/17/2021   IR ABLATE LIVER CRYOABLATION  09/17/2021   IR RADIOLOGIST EVAL & MGMT  08/04/2021   IR RADIOLOGIST EVAL & MGMT  09/11/2021   LAPAROSCOPIC GASTRIC BANDING     SUPRACERVICAL ABDOMINAL HYSTERECTOMY  2017    Allergies: Soy allergy, Peanut-containing drug products, Percocet [oxycodone-acetaminophen], and Shellfish allergy  Medications: Prior to Admission medications   Medication Sig Start Date End Date Taking? Authorizing Provider  albuterol (VENTOLIN HFA) 108 (90 Base) MCG/ACT inhaler Inhale into the lungs every 6 (six) hours as needed for wheezing or shortness of breath.    [provider]  atorvastatin (LIPITOR) 20 MG tablet Take 1 tablet (20 mg total) by mouth daily. 09/29/21   Nche, Bonna Gains, NP  DULoxetine (CYMBALTA) 20 MG capsule TAKE 1 CAPSULE BY MOUTH EVERY DAY 09/19/21   Nche, Bonna Gains, NP  fluticasone (FLONASE) 50 MCG/ACT nasal spray Place 2 sprays into both nostrils daily. 10/01/20   Loyola Mast, MD  ibuprofen (ADVIL) 800 MG tablet Take 800 mg by mouth 3 (three) times daily. 07/08/21   [provider]  losartan (COZAAR) 25 MG tablet Take 1 tablet (25 mg total) by mouth daily. 09/01/21   Nche, Bonna Gains, NP  montelukast (SINGULAIR) 10 MG tablet Take 10 mg by  mouth at bedtime.    [provider]  Semaglutide, 2 MG/DOSE, (OZEMPIC, 2 MG/DOSE,) 8 MG/3ML SOPN Inject 2 mg into the skin once a week. 09/01/21   Nche, Bonna Gains, NP     Family History  Problem Relation Age of Onset   Diabetes Mother    Hypertension Mother    Asthma Mother    Diabetes Father    Hypertension Father    Throat cancer Father    Asthma Sister    Asthma Brother    Asthma Brother    Pulmonary embolism Brother    Stomach cancer Maternal Grandmother    Lung cancer Maternal  Grandfather    Other Daughter        prediabetes    Social History   Socioeconomic History   Marital status: Married    Spouse name: Not on file   Number of children: 3   Years of education: Not on file   Highest education level: Not on file  Occupational History   Occupation: Runner, broadcasting/film/video  Tobacco Use   Smoking status: Never   Smokeless tobacco: Never  Vaping Use   Vaping Use: Never used  Substance and Sexual Activity   Alcohol use: Not Currently   Drug use: Never   Sexual activity: Yes    Partners: Male    Birth control/protection: Surgical    Comment: Married 30 yrs  Other Topics Concern   Not on file  Social History Narrative   Not on file   Social Determinants of Health   Financial Resource Strain: Not on file  Food Insecurity: Not on file  Transportation Needs: Not on file  Physical Activity: Not on file  Stress: Not on file  Social Connections: Not on file    ECOG Status: 2 - Symptomatic, <50% confined to bed  Review of Systems  Review of Systems: A 12 point ROS discussed and pertinent positives are indicated in the HPI above.  All other systems are negative.    Physical Exam No direct physical exam was performed (except for noted visual exam findings with Video Visits).   Vital Signs: LMP 06/15/2010   Imaging: IR PERIPHERAL NERVE CRYOABLATION KNEE RIGHT  Result Date: 09/17/2021 INDICATION: Right sided knee pain. Please refer to formal consultation in the [facility] EPIC EMR for additional details. EXAM: 1. ULTRASOUND-GUIDED CRYONEUROLYSIS (IOVERA) OF THE INTERMEDIATE DIVISION OF THE ANTERIOR FEMORAL CUTANEOUS NERVE (IAFCN) 2. ULTRASOUND-GUIDED CRYONEUROLYSIS (IOVERA) OF THE MEDIAL DIVISION OF THE ANTERIOR FEMORAL CUTANEOUS NERVE (MAFCN) 3. ULTRASOUND-GUIDED CRYONEUROLYSIS (IOVERA) OF THE LATERAL FEMORAL CUTANEOUS NERVE (LFCN) 4. ULTRASOUND-GUIDED CRYONEUROLYSIS (IOVERA) OF THE INFRAPATELLAR BRANCH OF THE SAPHENOUS NERVE (ISN) COMPARISON:  None Available.  MEDICATIONS: None ANESTHESIA/SEDATION: None FLUOROSCOPY TIME:  None COMPLICATIONS: None immediate. TECHNIQUE: Informed written consent was obtained from the patient after a thorough discussion of the procedural risks, benefits and alternatives. All questions were addressed. A timeout was performed prior to the initiation of the procedure. The patient was positioned supine and sonographic evaluation of the affected thigh and knee was performed by the dictating interventional radiologist. Selected nerves were marked with a skin pen and the procedure was planned. Attention was first paid towards cryoablation of the intermediate division of the anterior femoral cutaneous nerve (IAFCN). The skin overlying the selected dermal access site was cleaned with chlorhexidine. Local anesthesia was provided with a combination of topical anesthetic spray followed by subcutaneous administration 1% lidocaine with epinephrine. Next, under direct ultrasound guidance, the 20 gauge Smart Tip 190 IOVERA cryoneurolysis probe was advanced  with tip ultimately positioned immediately adjacent to the targeted nerve. Multiple ultrasound images were saved for procedural documentation purposes. The cryoneurolysis was then performed for 1 minute, 45 seconds for a total of 1 cycle(s) with intermittent sonographer evaluation. The identical procedure was repeated targeting the medial division of the anterior femoral cutaneous nerve (MAFCN), the lateral femoral cutaneous nerve (LFCN) and the infrapatellar branch of the saphenous nerve (ISN). The cryoneurolysis was performed for each of the above nerve for 1 minute, 45 seconds for a total of 1 cycle each, except the right ISN which was treated with a total of 3 cycles (per patient request), all with intermittent sonographer evaluation. Multiple ultrasound images were saved for procedural documentation purposes. Note, given patient's persistent sensation about anterior inferior aspect of the  contralateral left knee, two attempts were made to perform cryoneurolysis of the left ISN utilizing the above technique. Following completion of the procedure, sensation about the anterior aspect of the knee was assessed and a postprocedural pain scale was obtained. Dressings were applied. The patient tolerated the procedure well without immediate postprocedural complication. FINDINGS: Sonographic guidance confirms appropriate positioning of the Smart Tip 190 IOVERA cryoneurolysis probe immediately adjacent to the targeted nerves with reduction in patient's postprocedural pain scale as follows: Pre procedural WOMAC score: 60/96 Pre procedural knee pain: At rest: 3/10 With activity: 5-6/10 Post procedural knee pain: At rest: 2/10 With activity: 2/10 IMPRESSION: 1. Technically successful cryoneurolysis (IOVERA) of the IAFCN, [place], LFCN and the ISN for right sided knee pain with clinically significant reduction in postprocedural pain scale. 2. Technically successful repeat cryoneurolysis (IOVERA) of the left ISN for residual sensation following recent left sided IOVERA treatment performed on 08/21/2021. Electronically Signed   By: Sandi Mariscal M.D.   On: 09/17/2021 13:21   IR PERIPHERAL NERVE CRYOABLATION KNEE RIGHT  Result Date: 09/17/2021 INDICATION: Right sided knee pain. Please refer to formal consultation in the [facility] for additional details. EXAM: 1. ULTRASOUND-GUIDED CRYONEUROLYSIS (IOVERA) OF THE INTERMEDIATE DIVISION OF THE ANTERIOR FEMORAL CUTANEOUS NERVE (IAFCN) 2. ULTRASOUND-GUIDED CRYONEUROLYSIS (IOVERA) OF THE MEDIAL DIVISION OF THE ANTERIOR FEMORAL CUTANEOUS NERVE (MAFCN) 3. ULTRASOUND-GUIDED CRYONEUROLYSIS (IOVERA) OF THE LATERAL FEMORAL CUTANEOUS NERVE (LFCN) 4. ULTRASOUND-GUIDED CRYONEUROLYSIS (IOVERA) OF THE INFRAPATELLAR BRANCH OF THE SAPHENOUS NERVE (ISN) COMPARISON:  None Available. MEDICATIONS: None ANESTHESIA/SEDATION: None FLUOROSCOPY TIME:  None COMPLICATIONS: None immediate.  TECHNIQUE: Informed written consent was obtained from the patient after a thorough discussion of the procedural risks, benefits and alternatives. All questions were addressed. A timeout was performed prior to the initiation of the procedure. The patient was positioned supine and sonographic evaluation of the affected thigh and knee was performed by the dictating interventional radiologist. Selected nerves were marked with a skin pen and the procedure was planned. Attention was first paid towards cryoablation of the intermediate division of the anterior femoral cutaneous nerve (IAFCN). The skin overlying the selected dermal access site was cleaned with chlorhexidine. Local anesthesia was provided with a combination of topical anesthetic spray followed by subcutaneous administration 1% lidocaine with epinephrine. Next, under direct ultrasound guidance, the 20 gauge Smart Tip 190 IOVERA cryoneurolysis probe was advanced with tip ultimately positioned immediately adjacent to the targeted nerve. Multiple ultrasound images were saved for procedural documentation purposes. The cryoneurolysis was then performed for 1 minute, 45 seconds for a total of 1 cycle(s) with intermittent sonographer evaluation. The identical procedure was repeated targeting the medial division of the anterior femoral cutaneous nerve (MAFCN), the lateral femoral cutaneous nerve (LFCN) and  the infrapatellar branch of the saphenous nerve (ISN). The cryoneurolysis was performed for each of the above nerve for 1 minute, 45 seconds for a total of 1 cycle each, except the right ISN which was treated with a total of 3 cycles (per patient request), all with intermittent sonographer evaluation. Multiple ultrasound images were saved for procedural documentation purposes. Note, given patient's persistent sensation about anterior inferior aspect of the contralateral left knee, two attempts were made to perform cryoneurolysis of the left ISN utilizing the above  technique. Following completion of the procedure, sensation about the anterior aspect of the knee was assessed and a postprocedural pain scale was obtained. Dressings were applied. The patient tolerated the procedure well without immediate postprocedural complication. FINDINGS: Sonographic guidance confirms appropriate positioning of the Smart Tip 190 IOVERA cryoneurolysis probe immediately adjacent to the targeted nerves with reduction in patient's postprocedural pain scale as follows: Pre procedural WOMAC score: 60/96 Pre procedural knee pain: At rest: 3/10 With activity: 5-6/10 Post procedural knee pain: At rest: 2/10 With activity: 2/10 IMPRESSION: 1. Technically successful cryoneurolysis (IOVERA) of the IAFCN, [place], LFCN and the ISN for right sided knee pain with clinically significant reduction in postprocedural pain scale. 2. Technically successful repeat cryoneurolysis (IOVERA) of the left ISN for residual sensation following recent left sided IOVERA treatment performed on 08/21/2021. Electronically Signed   By: Sandi Mariscal M.D.   On: 09/17/2021 13:21   IR PERIPHERAL NERVE CRYOABLATION KNEE RIGHT  Result Date: 09/17/2021 INDICATION: Right sided knee pain. Please refer to formal consultation in the [facility] for additional details. EXAM: 1. ULTRASOUND-GUIDED CRYONEUROLYSIS (IOVERA) OF THE INTERMEDIATE DIVISION OF THE ANTERIOR FEMORAL CUTANEOUS NERVE (IAFCN) 2. ULTRASOUND-GUIDED CRYONEUROLYSIS (IOVERA) OF THE MEDIAL DIVISION OF THE ANTERIOR FEMORAL CUTANEOUS NERVE (MAFCN) 3. ULTRASOUND-GUIDED CRYONEUROLYSIS (IOVERA) OF THE LATERAL FEMORAL CUTANEOUS NERVE (LFCN) 4. ULTRASOUND-GUIDED CRYONEUROLYSIS (IOVERA) OF THE INFRAPATELLAR BRANCH OF THE SAPHENOUS NERVE (ISN) COMPARISON:  None Available. MEDICATIONS: None ANESTHESIA/SEDATION: None FLUOROSCOPY TIME:  None COMPLICATIONS: None immediate. TECHNIQUE: Informed written consent was obtained from the patient after a thorough discussion of the procedural risks,  benefits and alternatives. All questions were addressed. A timeout was performed prior to the initiation of the procedure. The patient was positioned supine and sonographic evaluation of the affected thigh and knee was performed by the dictating interventional radiologist. Selected nerves were marked with a skin pen and the procedure was planned. Attention was first paid towards cryoablation of the intermediate division of the anterior femoral cutaneous nerve (IAFCN). The skin overlying the selected dermal access site was cleaned with chlorhexidine. Local anesthesia was provided with a combination of topical anesthetic spray followed by subcutaneous administration 1% lidocaine with epinephrine. Next, under direct ultrasound guidance, the 20 gauge Smart Tip 190 IOVERA cryoneurolysis probe was advanced with tip ultimately positioned immediately adjacent to the targeted nerve. Multiple ultrasound images were saved for procedural documentation purposes. The cryoneurolysis was then performed for 1 minute, 45 seconds for a total of 1 cycle(s) with intermittent sonographer evaluation. The identical procedure was repeated targeting the medial division of the anterior femoral cutaneous nerve (MAFCN), the lateral femoral cutaneous nerve (LFCN) and the infrapatellar branch of the saphenous nerve (ISN). The cryoneurolysis was performed for each of the above nerve for 1 minute, 45 seconds for a total of 1 cycle each, except the right ISN which was treated with a total of 3 cycles (per patient request), all with intermittent sonographer evaluation. Multiple ultrasound images were saved for procedural documentation purposes. Note, given  patient's persistent sensation about anterior inferior aspect of the contralateral left knee, two attempts were made to perform cryoneurolysis of the left ISN utilizing the above technique. Following completion of the procedure, sensation about the anterior aspect of the knee was assessed and a  postprocedural pain scale was obtained. Dressings were applied. The patient tolerated the procedure well without immediate postprocedural complication. FINDINGS: Sonographic guidance confirms appropriate positioning of the Smart Tip 190 IOVERA cryoneurolysis probe immediately adjacent to the targeted nerves with reduction in patient's postprocedural pain scale as follows: Pre procedural WOMAC score: 60/96 Pre procedural knee pain: At rest: 3/10 With activity: 5-6/10 Post procedural knee pain: At rest: 2/10 With activity: 2/10 IMPRESSION: 1. Technically successful cryoneurolysis (IOVERA) of the IAFCN, [place], LFCN and the ISN for right sided knee pain with clinically significant reduction in postprocedural pain scale. 2. Technically successful repeat cryoneurolysis (IOVERA) of the left ISN for residual sensation following recent left sided IOVERA treatment performed on 08/21/2021. Electronically Signed   By: Sandi Mariscal M.D.   On: 09/17/2021 13:21    Labs:  CBC: Recent Labs    01/20/21 1258 03/11/21 0920  WBC 6.7 5.9  HGB 12.1 12.4  HCT 37.9 38.0  PLT 286.0 252.0    COAGS: No results for input(s): "INR", "APTT" in the last 8760 hours.  BMP: Recent Labs    01/20/21 1258 03/11/21 0920 09/29/21 0900  NA 141 139 141  K 3.4* 4.0 3.5  CL 104 101 102  CO2 30 31 31   GLUCOSE 93 133* 82  BUN 13 10 15   CALCIUM 9.1 9.5 9.5  CREATININE 0.81 0.77 0.81    LIVER FUNCTION TESTS: Recent Labs    03/11/21 0920 09/29/21 0900  BILITOT 0.4 0.4  AST 17 14  ALT 13 12  ALKPHOS 93 86  PROT 7.5 7.4  ALBUMIN 3.8 4.0    TUMOR MARKERS: No results for input(s): "AFPTM", "CEA", "CA199", "CHROMGRNA" in the last 8760 hours.  Assessment and Plan:  Veronica [name] is a 51 y.o. female with past medical history significant for asthma, GERD and obesity (post laparoscopic gastric banding), who is seen today in telemedicine consultation following technically successful left-sided IOVERA therapy performed  08/21/2021 and IOVERA of the right knee on 6/28.  Unfortunately, while the patient had a successful and durable result with cryoneurolysis of the left knee, she states her knee is numb at all locations except her knee cap where the majority of her pain resides, rating her postprocedural pain scale the same as her preprocedural pain scale.  While I am happy the patient had a clinically significant result of her left knee, I am not terribly surprised that she has not had the same result of the right knee as I am uncertain of the durability of the treatment modality.  This was explained in great detail the patient who is understanding and appreciative of the attempt of improvement.  The patient knows that if she were to elect repeat IOVERA therapy of either the left or right knee, we would perform these procedures in approximately 6 months (December/January).  She knows to call the IR clinic with any future questions or concerns.   A copy of this report was sent to the requesting provider on this date.  Electronically Signed: Sandi Mariscal 10/13/2021, 10:41 AM   I spent a total of 10 Minutes in remote  clinical consultation, greater than 50% of which was counseling/coordinating care for port right IOVERA.    Visit type: Audio only (  telephone). Audio (no video) only due to patient's lack of internet/smartphone capability. Alternative for in-person consultation at [facility], [address] [place], [place], [place]. This visit type was conducted due to national recommendations for restrictions regarding the COVID-19 Pandemic (e.g. social distancing).  This format is felt to be most appropriate for this patient at this time.  All issues noted in this document were discussed and addressed.

## 2021-10-20 NOTE — Progress Notes (Unsigned)
   I, Veronica [name], LAT, ATC acting as a scribe for Veronica Graham, MD.  Veronica [name] is a 51 y.o. female who presents to [facility] today for f/u R thumb pain and triggering. Pt was previously seen by Dr. Jean Rosenthal on 04/23/21 for bilat knee pain and R thumb triggering, which had resolved from prior steroid injection on 04/03/21. Today, pt reports /. Pt locates pain to her left thumb she has trigger finger , not clicking as bad as the right but the nodule is painful and weak, isn't able to hold on to things for an extended period of time  Aggravates: Treatments tried: prior steroid injection,   Dx imaging: 04/03/21 R hand XR  Pertinent review of systems: No fevers or chills  Relevant historical information: Prior trigger thumb right hand   Exam:  Pulse 84   Ht 5\' 2"  (1.575 m)   Wt 298 lb (135.2 kg)   LMP 06/15/2010   SpO2 98%   BMI 54.50 kg/m  General: Well Developed, well nourished, and in no acute distress.   MSK: Left hand tender palpation palmar first MCP  Decreased flexion at IP joint thumb.    Lab and Radiology Results  Procedure: Real-time Ultrasound Guided Injection of left thumb flexor tendon sheath A1 pulley (trigger thumb injection) Device: Philips Affiniti 50G Images permanently stored and available for review in PACS Verbal informed consent obtained.  Discussed risks and benefits of procedure. Warned about infection, bleeding, hyperglycemia damage to structures among others. Patient expresses understanding and agreement Time-out conducted.   Noted no overlying erythema, induration, or other signs of local infection.   Skin prepped in a sterile fashion.   Local anesthesia: Topical Ethyl chloride.   With sterile technique and under real time ultrasound guidance: 40 mg of Kenalog and 1 mL of lidocaine injected into tendon sheath. Fluid seen entering the tendon sheath.   Completed without difficulty   Pain immediately resolved  suggesting accurate placement of the medication.   Advised to call if fevers/chills, erythema, induration, drainage, or persistent bleeding.   Images permanently stored and available for review in the ultrasound unit.  Impression: Technically successful ultrasound guided injection.         Assessment and Plan: 50 y.o. female with left trigger thumb.  Plan for injection today and double Band-Aid splint.  Recheck back as needed.   PDMP not reviewed this encounter. Orders Placed This Encounter  Procedures   40 LIMITED JOINT SPACE STRUCTURES UP RIGHT(NO LINKED CHARGES)    Order Specific Question:   Reason for Exam (SYMPTOM  OR DIAGNOSIS REQUIRED)    Answer:   right thumb pain    Order Specific Question:   Preferred imaging location?    Answer:   [facility]   No orders of the defined types were placed in this encounter.    Discussed warning signs or symptoms. Please see discharge instructions. Patient expresses understanding.   The above documentation has been reviewed and is accurate and complete US, M.D.

## 2021-10-21 ENCOUNTER — Ambulatory Visit: Payer: BC Managed Care – PPO | Admitting: Family Medicine

## 2021-10-21 ENCOUNTER — Ambulatory Visit: Payer: Self-pay

## 2021-10-21 VITALS — HR 84 | Ht 62.0 in | Wt 298.0 lb

## 2021-10-21 DIAGNOSIS — M65311 Trigger thumb, right thumb: Secondary | ICD-10-CM | POA: Diagnosis not present

## 2021-10-21 NOTE — Patient Instructions (Addendum)
Thank you for coming in today.   You received an injection today. Seek immediate medical attention if the joint becomes red, extremely painful, or is oozing fluid.   Double Band-aid Splint  Check back as needed!  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

## 2021-10-24 ENCOUNTER — Other Ambulatory Visit: Payer: Self-pay | Admitting: Nurse Practitioner

## 2021-10-24 ENCOUNTER — Ambulatory Visit
Admission: RE | Admit: 2021-10-24 | Discharge: 2021-10-24 | Disposition: A | Discharge status: Home / Self Care | Payer: BC Managed Care – PPO | Source: Ambulatory Visit | Attending: Nurse Practitioner | Admitting: Nurse Practitioner

## 2021-10-24 DIAGNOSIS — R928 Other abnormal and inconclusive findings on diagnostic imaging of breast: Secondary | ICD-10-CM | POA: Diagnosis not present

## 2021-10-24 DIAGNOSIS — N6042 Mammary duct ectasia of left breast: Secondary | ICD-10-CM | POA: Diagnosis not present

## 2021-10-24 DIAGNOSIS — N6489 Other specified disorders of breast: Secondary | ICD-10-CM

## 2021-10-28 ENCOUNTER — Other Ambulatory Visit (HOSPITAL_COMMUNITY): Payer: Self-pay | Admitting: Interventional Radiology

## 2021-10-28 DIAGNOSIS — M25561 Pain in right knee: Secondary | ICD-10-CM

## 2021-10-28 NOTE — Addendum Note (Signed)
Encounter addended by: Arby Barrette on: 10/28/2021 9:58 AM  Actions taken: Imaging Exam ended

## 2021-10-28 NOTE — Addendum Note (Signed)
Encounter addended by: Arby Barrette on: 10/28/2021 10:00 AM  Actions taken: Imaging Exam ended

## 2021-11-05 ENCOUNTER — Ambulatory Visit: Payer: BC Managed Care – PPO | Admitting: Sports Medicine

## 2021-11-07 ENCOUNTER — Telehealth: Payer: Self-pay

## 2021-11-07 NOTE — Telephone Encounter (Signed)
Patient is unable to schedule colon for opening date 11/11/21. She is aware she will be contacted at next available date.

## 2021-11-10 ENCOUNTER — Ambulatory Visit: Payer: BC Managed Care – PPO | Admitting: Nurse Practitioner

## 2021-11-14 ENCOUNTER — Telehealth: Payer: Self-pay | Admitting: Podiatry

## 2021-11-14 NOTE — Telephone Encounter (Signed)
Patient called she dropped off her orthotics to be repaired 3 weeks ago, is there any update when they will be coming in ?   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

## 2021-11-18 NOTE — Telephone Encounter (Signed)
Pt is coming on 08/31 to pick these up.

## 2021-11-19 ENCOUNTER — Telehealth: Payer: Self-pay | Admitting: Nurse Practitioner

## 2021-11-19 NOTE — Telephone Encounter (Signed)
Patient picked up refurbished orth on 8/30

## 2021-12-01 DIAGNOSIS — M170 Bilateral primary osteoarthritis of knee: Secondary | ICD-10-CM | POA: Diagnosis not present

## 2021-12-01 DIAGNOSIS — M1711 Unilateral primary osteoarthritis, right knee: Secondary | ICD-10-CM | POA: Diagnosis not present

## 2021-12-01 DIAGNOSIS — M1712 Unilateral primary osteoarthritis, left knee: Secondary | ICD-10-CM | POA: Diagnosis not present

## 2021-12-20 ENCOUNTER — Other Ambulatory Visit: Payer: Self-pay | Admitting: Nurse Practitioner

## 2021-12-20 DIAGNOSIS — E1165 Type 2 diabetes mellitus with hyperglycemia: Secondary | ICD-10-CM

## 2021-12-26 ENCOUNTER — Other Ambulatory Visit: Payer: Self-pay

## 2021-12-26 ENCOUNTER — Telehealth: Payer: Self-pay | Admitting: Nurse Practitioner

## 2021-12-26 ENCOUNTER — Telehealth: Payer: Self-pay

## 2021-12-26 DIAGNOSIS — Z1211 Encounter for screening for malignant neoplasm of colon: Secondary | ICD-10-CM

## 2021-12-26 DIAGNOSIS — E1165 Type 2 diabetes mellitus with hyperglycemia: Secondary | ICD-10-CM

## 2021-12-26 MED ORDER — OZEMPIC (2 MG/DOSE) 8 MG/3ML ~~LOC~~ SOPN
2.0000 mg | PEN_INJECTOR | SUBCUTANEOUS | 0 refills | Status: DC
  Filled 2021-12-29: qty 3, 28d supply, fill #0
  Filled 2022-01-19: qty 3, 28d supply, fill #1
  Filled 2022-02-17: qty 3, 28d supply, fill #2

## 2021-12-26 NOTE — Telephone Encounter (Signed)
Caller Name: Brigetta Call back phone #: [phone]   MEDICATION(S):  Semaglutide, 2 MG/DOSE, (OZEMPIC, 2 MG/DOSE,) 8 MG/3ML SOPN  Days of Med Remaining:   Has the patient contacted their pharmacy (YES/NO)? yes What did pharmacy advise?   Preferred Pharmacy:  [id number] - [place], [address] Phone:  [phone]      ~~~Please advise patient/caregiver to allow 2-3 business days to process RX refills.  Pt stated she need it because she will be out this weekend

## 2021-12-26 NOTE — Telephone Encounter (Signed)
Patient has been scheduled for colon at [facility] on 03/02/22 at 7:30 am with Dr. Tarri Glenn. PV scheduled for 02/09/22 at 1:30 pm. Amb ref & hospital orders placed. No blood thinners.

## 2021-12-29 ENCOUNTER — Other Ambulatory Visit (HOSPITAL_COMMUNITY): Payer: Self-pay

## 2021-12-31 ENCOUNTER — Encounter: Payer: Self-pay | Admitting: Nurse Practitioner

## 2021-12-31 ENCOUNTER — Ambulatory Visit (INDEPENDENT_AMBULATORY_CARE_PROVIDER_SITE_OTHER): Payer: BC Managed Care – PPO | Admitting: Nurse Practitioner

## 2021-12-31 VITALS — BP 120/80 | HR 82 | Temp 96.7°F | Ht 62.0 in | Wt 286.2 lb

## 2021-12-31 DIAGNOSIS — K5903 Drug induced constipation: Secondary | ICD-10-CM

## 2021-12-31 DIAGNOSIS — Z111 Encounter for screening for respiratory tuberculosis: Secondary | ICD-10-CM

## 2021-12-31 DIAGNOSIS — Z23 Encounter for immunization: Secondary | ICD-10-CM

## 2021-12-31 DIAGNOSIS — E1169 Type 2 diabetes mellitus with other specified complication: Secondary | ICD-10-CM

## 2021-12-31 DIAGNOSIS — E1165 Type 2 diabetes mellitus with hyperglycemia: Secondary | ICD-10-CM

## 2021-12-31 DIAGNOSIS — I10 Essential (primary) hypertension: Secondary | ICD-10-CM | POA: Diagnosis not present

## 2021-12-31 DIAGNOSIS — J4520 Mild intermittent asthma, uncomplicated: Secondary | ICD-10-CM | POA: Diagnosis not present

## 2021-12-31 DIAGNOSIS — E785 Hyperlipidemia, unspecified: Secondary | ICD-10-CM

## 2021-12-31 DIAGNOSIS — F411 Generalized anxiety disorder: Secondary | ICD-10-CM

## 2021-12-31 MED ORDER — ALBUTEROL SULFATE HFA 108 (90 BASE) MCG/ACT IN AERS: 1.0000 | INHALATION_SPRAY | Freq: Four times a day (QID) | RESPIRATORY_TRACT | 1 refills | Status: DC | PRN

## 2021-12-31 MED ORDER — POLYETHYLENE GLYCOL 3350 17 G PO PACK: 17.0000 g | PACK | Freq: Every day | ORAL | 0 refills | Status: DC

## 2021-12-31 MED ORDER — DULOXETINE HCL 20 MG PO CPEP: 20.0000 mg | ORAL_CAPSULE | Freq: Every day | ORAL | 3 refills | Status: DC

## 2021-12-31 NOTE — Patient Instructions (Addendum)
Schedule fasting lab appt. Will complete form after quantiferon results.

## 2021-12-31 NOTE — Assessment & Plan Note (Signed)
BP at goal with losartan BP Readings from Last 3 Encounters:  12/31/21 120/80  09/29/21 120/78  09/29/21 136/90   maintain med dose

## 2021-12-31 NOTE — Assessment & Plan Note (Signed)
Repeat lipid panel Maintain atorvastatin dose 

## 2021-12-31 NOTE — Assessment & Plan Note (Addendum)
Reports intermittent nausea and constipation with ozempic inj. No ABD pain or vomiting. Wt Readings from Last 3 Encounters:  12/31/21 286 lb 3.2 oz (129.8 kg)  10/21/21 298 lb (135.2 kg)  09/29/21 298 lb 4 oz (135.3 kg)   Advised to maintain low fat/low carb/high fiber diet. Eat small frequent meals Start miralax daily for constipation. Check CMP and hgbA1c F/up in 47month

## 2021-12-31 NOTE — Progress Notes (Signed)
Established Patient Visit  Patient: Veronica [name]   DOB: [birth year]   51 y.o. Female  MRN: [id number] Visit Date: 12/31/2021  Subjective:    Chief Complaint  Patient presents with   OFFICE VISIT    Office Visit- f/u DM,HTN,Hyperlipema Not Fasting Not taking BP   HPI HTN (hypertension), benign BP at goal with losartan BP Readings from Last 3 Encounters:  12/31/21 120/80  09/29/21 120/78  09/29/21 136/90   maintain med dose  Type 2 diabetes mellitus with hyperglycemia, without long-term current use of insulin (HCC) Reports intermittent nausea and constipation with ozempic inj. No ABD pain or vomiting. Wt Readings from Last 3 Encounters:  12/31/21 286 lb 3.2 oz (129.8 kg)  10/21/21 298 lb (135.2 kg)  09/29/21 298 lb 4 oz (135.3 kg)   Advised to maintain low fat/low carb/high fiber diet. Eat small frequent meals Start miralax daily for constipation. Check CMP and hgbA1c F/up in 96month  Hyperlipidemia associated with type 2 diabetes mellitus (HCC) Repeat lipid panel Maintain atorvastatin dose   Reviewed medical, surgical, and social history today  Medications: Outpatient Medications Prior to Visit  Medication Sig   atorvastatin (LIPITOR) 20 MG tablet Take 1 tablet (20 mg total) by mouth daily.   DULoxetine (CYMBALTA) 20 MG capsule TAKE 1 CAPSULE BY MOUTH EVERY DAY   fluticasone (FLONASE) 50 MCG/ACT nasal spray Place 2 sprays into both nostrils daily.   ibuprofen (ADVIL) 800 MG tablet Take 800 mg by mouth 3 (three) times daily.   losartan (COZAAR) 25 MG tablet Take 1 tablet (25 mg total) by mouth daily.   montelukast (SINGULAIR) 10 MG tablet Take 10 mg by mouth at bedtime.   [DISCONTINUED] albuterol (VENTOLIN HFA) 108 (90 Base) MCG/ACT inhaler Inhale into the lungs every 6 (six) hours as needed for wheezing or shortness of breath.   Semaglutide, 2 MG/DOSE, (OZEMPIC, 2 MG/DOSE,) 8 MG/3ML SOPN Inject 2 mg into the skin once a week.   No  facility-administered medications prior to visit.   Reviewed past medical and social history.   ROS per HPI above      Objective:  BP 120/80   Pulse 82   Temp (!) 96.7 F (35.9 C) (Temporal)   Ht 5\' 2"  (1.575 m)   Wt 286 lb 3.2 oz (129.8 kg)   LMP 06/15/2010   SpO2 93%   BMI 52.35 kg/m      Physical Exam Cardiovascular:     Rate and Rhythm: Normal rate.     Pulses: Normal pulses.  Pulmonary:     Effort: Pulmonary effort is normal.  Musculoskeletal:     Right lower leg: No edema.     Left lower leg: No edema.  Neurological:     Mental Status: She is alert and oriented to person, place, and time.  Psychiatric:        Mood and Affect: Mood normal.        Thought Content: Thought content normal.     No results found for any visits on 12/31/21.    Assessment & Plan:    Problem List Items Addressed This Visit       Cardiovascular and Mediastinum   HTN (hypertension), benign    BP at goal with losartan BP Readings from Last 3 Encounters:  12/31/21 120/80  09/29/21 120/78  09/29/21 136/90   maintain med dose      Relevant Orders   Comprehensive  metabolic panel     Respiratory   Asthma   Relevant Medications   albuterol (VENTOLIN HFA) 108 (90 Base) MCG/ACT inhaler     Endocrine   Hyperlipidemia associated with type 2 diabetes mellitus ([place])    Repeat lipid panel Maintain atorvastatin dose      Relevant Orders   Lipid panel   Comprehensive metabolic panel   Type 2 diabetes mellitus with hyperglycemia, without long-term current use of insulin (HCC) - Primary    Reports intermittent nausea and constipation with ozempic inj. No ABD pain or vomiting. Wt Readings from Last 3 Encounters:  12/31/21 286 lb 3.2 oz (129.8 kg)  10/21/21 298 lb (135.2 kg)  09/29/21 298 lb 4 oz (135.3 kg)   Advised to maintain low fat/low carb/high fiber diet. Eat small frequent meals Start miralax daily for constipation. Check CMP and hgbA1c F/up in 68month      Relevant  Orders   Comprehensive metabolic panel   Hemoglobin A1c   Other Visit Diagnoses     Drug-induced constipation       Relevant Medications   polyethylene glycol (MIRALAX) 17 g packet   Screening-pulmonary TB       Relevant Orders   QuantiFERON-TB Gold Plus   Need for influenza vaccination       Relevant Orders   Flu Vaccine QUAD High Dose(Fluad) (Completed)      Return in about 2 months (around 03/02/2022) for CPE (fasting).     Wilfred Lacy, NP

## 2022-01-07 DIAGNOSIS — M170 Bilateral primary osteoarthritis of knee: Secondary | ICD-10-CM | POA: Diagnosis not present

## 2022-01-07 DIAGNOSIS — M1711 Unilateral primary osteoarthritis, right knee: Secondary | ICD-10-CM | POA: Diagnosis not present

## 2022-01-07 DIAGNOSIS — M1712 Unilateral primary osteoarthritis, left knee: Secondary | ICD-10-CM | POA: Diagnosis not present

## 2022-01-09 ENCOUNTER — Other Ambulatory Visit (INDEPENDENT_AMBULATORY_CARE_PROVIDER_SITE_OTHER): Payer: BC Managed Care – PPO

## 2022-01-09 DIAGNOSIS — E1169 Type 2 diabetes mellitus with other specified complication: Secondary | ICD-10-CM

## 2022-01-09 DIAGNOSIS — I10 Essential (primary) hypertension: Secondary | ICD-10-CM

## 2022-01-09 DIAGNOSIS — E1165 Type 2 diabetes mellitus with hyperglycemia: Secondary | ICD-10-CM

## 2022-01-09 DIAGNOSIS — E785 Hyperlipidemia, unspecified: Secondary | ICD-10-CM | POA: Diagnosis not present

## 2022-01-09 DIAGNOSIS — Z111 Encounter for screening for respiratory tuberculosis: Secondary | ICD-10-CM

## 2022-01-09 LAB — COMPREHENSIVE METABOLIC PANEL
ALT: 15 U/L (ref 0–35)
AST: 16 U/L (ref 0–37)
Albumin: 3.9 g/dL (ref 3.5–5.2)
Alkaline Phosphatase: 93 U/L (ref 39–117)
BUN: 14 mg/dL (ref 6–23)
CO2: 27 mEq/L (ref 19–32)
Calcium: 9.3 mg/dL (ref 8.4–10.5)
Chloride: 105 mEq/L (ref 96–112)
Creatinine, Ser: 0.81 mg/dL (ref 0.40–1.20)
GFR: 82.69 mL/min (ref >60.00)
Glucose, Bld: 85 mg/dL (ref 70–99)
Potassium: 3.5 mEq/L (ref 3.5–5.1)
Sodium: 141 mEq/L (ref 135–145)
Total Bilirubin: 0.4 mg/dL (ref 0.2–1.2)
Total Protein: 7.2 g/dL (ref 6.0–8.3)

## 2022-01-09 LAB — LIPID PANEL
Cholesterol: 131 mg/dL (ref 0–200)
HDL: 53.1 mg/dL (ref >39.00)
LDL Cholesterol: 68 mg/dL (ref 0–99)
NonHDL: 78.02
Total CHOL/HDL Ratio: 2
Triglycerides: 48 mg/dL (ref 0.0–149.0)
VLDL: 9.6 mg/dL (ref 0.0–40.0)

## 2022-01-09 LAB — HEMOGLOBIN A1C: Hgb A1c MFr Bld: 6.5 % (ref 4.6–6.5)

## 2022-01-15 LAB — QUANTIFERON-TB GOLD PLUS
Mitogen-NIL: 4.29 IU/mL
NIL: 0.13 IU/mL
QuantiFERON-TB Gold Plus: NEGATIVE
TB1-NIL: 0.02 IU/mL
TB2-NIL: 0 IU/mL

## 2022-01-19 ENCOUNTER — Other Ambulatory Visit (HOSPITAL_COMMUNITY): Payer: Self-pay

## 2022-01-20 ENCOUNTER — Other Ambulatory Visit (HOSPITAL_COMMUNITY): Payer: Self-pay

## 2022-02-02 ENCOUNTER — Telehealth: Payer: Self-pay | Admitting: *Deleted

## 2022-02-02 NOTE — Telephone Encounter (Signed)
Veronica [name],  This pt's BMI is > 50, her procedure will need to be done at the hospital.  Thanks,  Cathlyn Parsons

## 2022-02-04 NOTE — Telephone Encounter (Signed)
Procedure scheduled at the hospital w/Dr Orvan Falconer 03-02-22.

## 2022-02-09 ENCOUNTER — Ambulatory Visit (AMBULATORY_SURGERY_CENTER): Payer: Self-pay | Admitting: *Deleted

## 2022-02-09 ENCOUNTER — Other Ambulatory Visit: Payer: Self-pay

## 2022-02-09 VITALS — Ht 64.0 in | Wt 284.4 lb

## 2022-02-09 DIAGNOSIS — Z86010 Personal history of colonic polyps: Secondary | ICD-10-CM

## 2022-02-09 MED ORDER — NA SULFATE-K SULFATE-MG SULF 17.5-3.13-1.6 GM/177ML PO SOLN: 1.0000 | Freq: Once | ORAL | 0 refills | Status: AC

## 2022-02-09 NOTE — Progress Notes (Signed)
Pre visit in person.  Patient reports colonoscopy ins [place] and had polyps.  SOY allergy but has has propofol  without issue. No egg allergy. No issues known to pt with past sedation with any surgeries or procedures Patient denies ever being told they had issues or difficulty with intubation  No FH of Malignant Hyperthermia Pt is not on diet pills Pt is not on  home 02  Pt is not on blood thinners  Pt denies issues with constipation  No A fib or A flutter Have any cardiac testing pending-- Pt instructed to use [email or web address] or GoodRx for a price reduction on prep

## 2022-02-17 ENCOUNTER — Other Ambulatory Visit (HOSPITAL_COMMUNITY): Payer: Self-pay

## 2022-02-20 ENCOUNTER — Other Ambulatory Visit (HOSPITAL_COMMUNITY): Payer: Self-pay

## 2022-02-23 ENCOUNTER — Encounter (HOSPITAL_COMMUNITY): Payer: Self-pay | Admitting: Gastroenterology

## 2022-02-25 ENCOUNTER — Encounter: Payer: Self-pay | Admitting: Gastroenterology

## 2022-02-27 ENCOUNTER — Encounter (HOSPITAL_COMMUNITY): Payer: Self-pay | Admitting: Gastroenterology

## 2022-02-28 ENCOUNTER — Other Ambulatory Visit: Payer: Self-pay | Admitting: Nurse Practitioner

## 2022-02-28 DIAGNOSIS — I10 Essential (primary) hypertension: Secondary | ICD-10-CM

## 2022-03-01 NOTE — Anesthesia Preprocedure Evaluation (Signed)
Anesthesia Evaluation  Patient identified by MRN, date of birth, ID band Patient awake    Reviewed: Allergy & Precautions, NPO status , Patient's Chart, lab work & pertinent test results  History of Anesthesia Complications Negative for: history of anesthetic complications  Airway Mallampati: II  TM Distance: >3 FB Neck ROM: Full    Dental  (+) Teeth Intact, Dental Advisory Given   Pulmonary neg shortness of breath, asthma , sleep apnea , neg COPD, neg recent URI   Pulmonary exam normal breath sounds clear to auscultation       Cardiovascular hypertension, (-) angina (-) Past MI, (-) Cardiac Stents and (-) CABG  Rhythm:Regular Rate:Normal  HLD   Neuro/Psych  PSYCHIATRIC DISORDERS Anxiety        GI/Hepatic Neg liver ROS, Bowel prep,GERD  ,,  Endo/Other  diabetes (on Ozempic), Type 2  Morbid obesity  Renal/GU negative Renal ROS     Musculoskeletal  (+) Arthritis ,    Abdominal  (+) + obese  Peds  Hematology negative hematology ROS (+)   Anesthesia Other Findings Last Ozempic: 02/22/22  Reproductive/Obstetrics                             Anesthesia Physical Anesthesia Plan  ASA: 3  Anesthesia Plan: MAC   Post-op Pain Management: Minimal or no pain anticipated   Induction: Intravenous  PONV Risk Score and Plan: 2 and Propofol infusion  Airway Management Planned: Natural Airway and Nasal Cannula  Additional Equipment:   Intra-op Plan:   Post-operative Plan:   Informed Consent: I have reviewed the patients History and Physical, chart, labs and discussed the procedure including the risks, benefits and alternatives for the proposed anesthesia with the patient or authorized representative who has indicated his/her understanding and acceptance.       Plan Discussed with: CRNA and Anesthesiologist  Anesthesia Plan Comments: (Discussed with patient risks of MAC including, but not  limited to, minor pain or discomfort, hearing people in the room, and possible need for backup general anesthesia. Risks for general anesthesia also discussed including, but not limited to, sore throat, hoarse voice, chipped/damaged teeth, injury to vocal cords, nausea and vomiting, allergic reactions, lung infection, heart attack, stroke, and death. All questions answered. )        Anesthesia Quick Evaluation

## 2022-03-02 ENCOUNTER — Encounter (HOSPITAL_COMMUNITY): Payer: Self-pay | Admitting: Gastroenterology

## 2022-03-02 ENCOUNTER — Telehealth: Payer: Self-pay

## 2022-03-02 ENCOUNTER — Encounter (HOSPITAL_COMMUNITY)
Admission: RE | Disposition: A | Discharge status: Home / Self Care | Payer: Self-pay | Source: Home / Self Care | Attending: Gastroenterology

## 2022-03-02 ENCOUNTER — Ambulatory Visit (HOSPITAL_COMMUNITY): Payer: BC Managed Care – PPO | Admitting: Anesthesiology

## 2022-03-02 ENCOUNTER — Ambulatory Visit (HOSPITAL_COMMUNITY)
Admission: RE | Admit: 2022-03-02 | Discharge: 2022-03-02 | Disposition: A | Discharge status: Home / Self Care | Payer: BC Managed Care – PPO | Attending: Gastroenterology | Admitting: Gastroenterology

## 2022-03-02 ENCOUNTER — Other Ambulatory Visit: Payer: Self-pay

## 2022-03-02 DIAGNOSIS — G4730 Sleep apnea, unspecified: Secondary | ICD-10-CM | POA: Diagnosis not present

## 2022-03-02 DIAGNOSIS — K5730 Diverticulosis of large intestine without perforation or abscess without bleeding: Secondary | ICD-10-CM | POA: Insufficient documentation

## 2022-03-02 DIAGNOSIS — Z6841 Body Mass Index (BMI) 40.0 and over, adult: Secondary | ICD-10-CM | POA: Insufficient documentation

## 2022-03-02 DIAGNOSIS — I10 Essential (primary) hypertension: Secondary | ICD-10-CM | POA: Diagnosis not present

## 2022-03-02 DIAGNOSIS — K648 Other hemorrhoids: Secondary | ICD-10-CM | POA: Insufficient documentation

## 2022-03-02 DIAGNOSIS — Z86010 Personal history of colonic polyps: Secondary | ICD-10-CM

## 2022-03-02 DIAGNOSIS — Z09 Encounter for follow-up examination after completed treatment for conditions other than malignant neoplasm: Secondary | ICD-10-CM | POA: Diagnosis not present

## 2022-03-02 DIAGNOSIS — K219 Gastro-esophageal reflux disease without esophagitis: Secondary | ICD-10-CM | POA: Diagnosis not present

## 2022-03-02 DIAGNOSIS — E669 Obesity, unspecified: Secondary | ICD-10-CM | POA: Diagnosis not present

## 2022-03-02 DIAGNOSIS — Z1211 Encounter for screening for malignant neoplasm of colon: Secondary | ICD-10-CM | POA: Diagnosis not present

## 2022-03-02 DIAGNOSIS — E119 Type 2 diabetes mellitus without complications: Secondary | ICD-10-CM | POA: Insufficient documentation

## 2022-03-02 HISTORY — PX: COLONOSCOPY WITH PROPOFOL: SHX5780

## 2022-03-02 LAB — GLUCOSE, CAPILLARY: Glucose-Capillary: 91 mg/dL (ref 70–99)

## 2022-03-02 SURGERY — COLONOSCOPY WITH PROPOFOL
Anesthesia: Monitor Anesthesia Care

## 2022-03-02 MED ORDER — PROPOFOL 10 MG/ML IV BOLUS
INTRAVENOUS | Status: AC
  Filled 2022-03-02: qty 20

## 2022-03-02 MED ORDER — PROPOFOL 10 MG/ML IV BOLUS
INTRAVENOUS | Status: DC | PRN
  Administered 2022-03-02: 50 mg via INTRAVENOUS

## 2022-03-02 MED ORDER — LIDOCAINE HCL (CARDIAC) PF 100 MG/5ML IV SOSY
PREFILLED_SYRINGE | INTRAVENOUS | Status: DC | PRN
  Administered 2022-03-02: 60 mg via INTRAVENOUS

## 2022-03-02 MED ORDER — SODIUM CHLORIDE 0.9 % IV SOLN: INTRAVENOUS | Status: DC

## 2022-03-02 MED ORDER — PROPOFOL 1000 MG/100ML IV EMUL
INTRAVENOUS | Status: AC
  Filled 2022-03-02: qty 100

## 2022-03-02 MED ORDER — ONDANSETRON HCL 4 MG/2ML IJ SOLN
INTRAMUSCULAR | Status: DC | PRN
  Administered 2022-03-02: 4 mg via INTRAVENOUS

## 2022-03-02 MED ORDER — LACTATED RINGERS IV SOLN: INTRAVENOUS | Status: DC

## 2022-03-02 MED ORDER — PROPOFOL 500 MG/50ML IV EMUL
INTRAVENOUS | Status: AC
  Filled 2022-03-02: qty 50

## 2022-03-02 MED ORDER — PROPOFOL 500 MG/50ML IV EMUL
INTRAVENOUS | Status: DC | PRN
  Administered 2022-03-02: 125 ug/kg/min via INTRAVENOUS

## 2022-03-02 SURGICAL SUPPLY — 22 items

## 2022-03-02 NOTE — H&P (Signed)
Referring Provider: Tressia Danas, MD Primary Care Physician:  Anne Ng, NP  Indication for Colonoscopy:  Colon cancer screening   IMPRESSION:  Need for colon cancer screening  PLAN: Colonoscopy   HPI: Veronica [name] is a 51 y.o. female presents for surveillance colonoscopy.  She had a colonoscopy in 2019 in [place]. She had some polyps removed at that time. She wasn't sure if she was supposed to have a colonoscopy every year or every other year.   No known family history of colon cancer or polyps. No family history of uterine/endometrial cancer, pancreatic cancer or gastric/stomach cancer. Father with esophageal cancer at age 84.    Past Medical History:  Diagnosis Date   Anxiety    Arthritis    Asthma    DM (diabetes mellitus) (HCC)    GERD (gastroesophageal reflux disease)    HTN (hypertension)    Obesity    Sleep apnea     Past Surgical History:  Procedure Laterality Date   CESAREAN SECTION     x 3   CHOLECYSTECTOMY     IR ABLATE LIVER CRYOABLATION  08/21/2021   IR ABLATE LIVER CRYOABLATION  08/21/2021   IR ABLATE LIVER CRYOABLATION  08/21/2021   IR ABLATE LIVER CRYOABLATION  09/17/2021   IR ABLATE LIVER CRYOABLATION  09/17/2021   IR ABLATE LIVER CRYOABLATION  09/17/2021   IR ABLATE LIVER CRYOABLATION  09/17/2021   IR RADIOLOGIST EVAL & MGMT  08/04/2021   IR RADIOLOGIST EVAL & MGMT  09/11/2021   IR RADIOLOGIST EVAL & MGMT  10/13/2021   LAPAROSCOPIC GASTRIC BANDING     SUPRACERVICAL ABDOMINAL HYSTERECTOMY  2017    Current Facility-Administered Medications  Medication Dose Route Frequency Provider Last Rate Last Admin   lactated ringers infusion   Intravenous Continuous Tressia Danas, MD 10 mL/hr at 03/02/22 0727 New Bag at 03/02/22 0727    Allergies as of 12/26/2021 - Review Complete 09/29/2021  Allergen Reaction Noted   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   Peanut-containing drug products  12/01/2019   Percocet [oxycodone-acetaminophen]   12/01/2019   Shellfish allergy  12/01/2019    Family History  Problem Relation Age of Onset   Diabetes Mother    Hypertension Mother    Asthma Mother    Diabetes Father    Hypertension Father    Throat cancer Father    Asthma Sister    Asthma Brother    Asthma Brother    Pulmonary embolism Brother    Stomach cancer Maternal Grandmother    Lung cancer Maternal Grandfather    Other Daughter        prediabetes   Colon cancer Neg Hx    Rectal cancer Neg Hx      Physical Exam: General:   Alert,  well-nourished, pleasant and cooperative in NAD Head:  Normocephalic and atraumatic. Eyes:  Sclera clear, no icterus.   Conjunctiva pink. Mouth:  No deformity or lesions.   Neck:  Supple; no masses or thyromegaly. Lungs:  Clear throughout to auscultation.   No wheezes. Heart:  Regular rate and rhythm; no murmurs. Abdomen:  Soft, non-tender, nondistended, normal bowel sounds, no rebound or guarding.  Msk:  Symmetrical. No boney deformities LAD: No inguinal or umbilical LAD Extremities:  No clubbing or edema. Neurologic:  Alert and  oriented x4;  grossly nonfocal Skin:  No obvious rash or bruise. Psych:  Alert and cooperative. Normal mood and affect.     Studies/Results: No results found.    Veronica Castillo L.  Orvan Falconer, MD, MPH 03/02/2022, 7:42 AM

## 2022-03-02 NOTE — Op Note (Addendum)
[facility] Patient Name: Veronica [name] Procedure Date: 03/02/2022 MRN: [id number] Attending MD: Tressia Danas MD, MD, [id number] Date of Birth: 01/21/1971 CSN: [id number] Age: 51 Admit Type: Outpatient Procedure:                Colonoscopy Indications:              Surveillance: Personal history of colonic polyps                            with unknown histology (unable to locate pathology                            results from last colonoscopy less than 3 years                            ago), Surveillance: Personal history of colonic                            polyps with unknown histology (unable to locate                            final results of last colonoscopy less than 3 years                            ago)                           She had a colonoscopy in 2019 in [place]. She had some                            polyps removed at that time. She wasn't sure if she                            was supposed to have a colonoscopy every year or                            every other year based on their recommendations. Providers:                Tressia Danas MD, MD, Doree Albee, RN, Rozetta Nunnery, Technician Referring MD:             Tressia Danas MD, MD Medicines:                Monitored Anesthesia Care Complications:            No immediate complications. Estimated Blood Loss:     Estimated blood loss: none. Procedure:                Pre-Anesthesia Assessment:                           - Prior to the procedure, a History and Physical  was performed, and patient medications and                            allergies were reviewed. The patient's tolerance of                            previous anesthesia was also reviewed. The risks                            and benefits of the procedure and the sedation                            options and risks were discussed with the patient.                             All questions were answered, and informed consent                            was obtained. Prior Anticoagulants: The patient has                            taken no anticoagulant or antiplatelet agents. ASA                            Grade Assessment: III - A patient with severe                            systemic disease. After reviewing the risks and                            benefits, the patient was deemed in satisfactory                            condition to undergo the procedure.                           After obtaining informed consent, the colonoscope                            was passed under direct vision. Throughout the                            procedure, the patient's blood pressure, pulse, and                            oxygen saturations were monitored continuously. The                            CF-HQ190L ([id number] Olympus colonoscope was                            introduced through the anus and advanced to the the  cecum, identified by appendiceal orifice and                            ileocecal valve. The colonoscopy was performed                            without difficulty. The patient tolerated the                            procedure well. The quality of the bowel                            preparation was good. The ileocecal valve,                            appendiceal orifice, and rectum were photographed. Scope In: 7:57:19 AM Scope Out: 8:10:49 AM Scope Withdrawal Time: 0 hours 7 minutes 55 seconds  Total Procedure Duration: 0 hours 13 minutes 30 seconds  Findings:      The perianal and digital rectal examinations were normal.      A few medium-mouthed and small-mouthed diverticula were found in the       sigmoid colon.      The exam was otherwise without abnormality on direct and retroflexion       views except for small internal hemorrhoids. Impression:               - Diverticulosis in the sigmoid colon.                            - Small internal hemorrhoids Moderate Sedation:      Not Applicable - Patient had care per Anesthesia. Recommendation:           - Patient has a contact number available for                            emergencies. The signs and symptoms of potential                            delayed complications were discussed with the                            patient. Return to normal activities tomorrow.                            Written discharge instructions were provided to the                            patient.                           - High fiber diet. Consider adding a daily dose of                            Metamucil or Benefiber.                           -  Continue present medications.                           - Repeat colonoscopy in 5 years for surveillance as                            the pathology results from her last colonoscopy are                            not known.                           - Emerging evidence supports eating a diet of                            fruits, vegetables, grains, calcium, and yogurt                            while reducing red meat and alcohol may reduce the                            risk of colon cancer. Procedure Code(s):        --- Professional ---                           [id number], Colorectal cancer screening; colonoscopy on                            individual at high risk Diagnosis Code(s):        --- Professional ---                           Z86.010, Personal history of colonic polyps                           K57.30, Diverticulosis of large intestine without                            perforation or abscess without bleeding CPT copyright 2022 American Medical Association. All rights reserved. The codes documented in this report are preliminary and upon coder review may  be revised to meet current compliance requirements. Tressia DanasKimberly Briany Aye MD, MD 03/02/2022 8:27:41 AM This report has been signed electronically. Number of  Addenda: 0

## 2022-03-02 NOTE — Anesthesia Postprocedure Evaluation (Signed)
Anesthesia Post Note  Patient: [name]  Procedure(s) Performed: COLONOSCOPY WITH PROPOFOL     Patient location during evaluation: PACU Anesthesia Type: MAC Level of consciousness: awake Pain management: pain level controlled Vital Signs Assessment: post-procedure vital signs reviewed and stable Respiratory status: spontaneous breathing, nonlabored ventilation and respiratory function stable Cardiovascular status: stable and blood pressure returned to baseline Postop Assessment: no apparent nausea or vomiting Anesthetic complications: no   No notable events documented.  Last Vitals:  Vitals:   03/02/22 0820 03/02/22 0830  BP: (!) 139/90 (!) 148/100  Pulse: 95 84  Resp: 17 (!) 21  Temp:    SpO2: 100% 94%    Last Pain:  Vitals:   03/02/22 0830  TempSrc:   PainSc: 0-No pain                 Nilda Simmer

## 2022-03-02 NOTE — Telephone Encounter (Signed)
-----   Message from Tressia Danas, MD sent at 03/02/2022  8:13 AM EST ----- Surveillance colonoscopy in 5 years. Thanks.  KLB

## 2022-03-02 NOTE — Discharge Instructions (Signed)

## 2022-03-02 NOTE — Telephone Encounter (Signed)
Chart supports Rx Last OV: 12/2021 Next OV: 122023  

## 2022-03-02 NOTE — Telephone Encounter (Signed)
Recall placed

## 2022-03-02 NOTE — Transfer of Care (Signed)
Immediate Anesthesia Transfer of Care Note  Patient: Veronica [name]  Procedure(s) Performed: Procedure(s): COLONOSCOPY WITH PROPOFOL (N/A)  Patient Location: PACU  Anesthesia Type:MAC  Level of Consciousness:  sedated, patient cooperative and responds to stimulation  Airway & Oxygen Therapy:Patient Spontanous Breathing and Patient connected to face mask oxgen  Post-op Assessment:  Report given to PACU RN and Post -op Vital signs reviewed and stable  Post vital signs:  Reviewed and stable  Last Vitals:  Vitals:   03/02/22 0714 03/02/22 0815  BP: 115/82 (!) 141/92  Pulse: 90 100  Resp: 18 12  Temp: (!) 36.4 C 36.8 C  SpO2: 42% 87%    Complications: No apparent anesthesia complications

## 2022-03-04 DIAGNOSIS — M170 Bilateral primary osteoarthritis of knee: Secondary | ICD-10-CM | POA: Diagnosis not present

## 2022-03-04 DIAGNOSIS — M1711 Unilateral primary osteoarthritis, right knee: Secondary | ICD-10-CM | POA: Diagnosis not present

## 2022-03-04 DIAGNOSIS — M1712 Unilateral primary osteoarthritis, left knee: Secondary | ICD-10-CM | POA: Diagnosis not present

## 2022-03-05 ENCOUNTER — Encounter (HOSPITAL_COMMUNITY): Payer: Self-pay | Admitting: Gastroenterology

## 2022-03-06 ENCOUNTER — Encounter: Payer: BC Managed Care – PPO | Admitting: Nurse Practitioner

## 2022-03-09 ENCOUNTER — Encounter: Payer: BC Managed Care – PPO | Admitting: Nurse Practitioner

## 2022-03-09 ENCOUNTER — Ambulatory Visit (INDEPENDENT_AMBULATORY_CARE_PROVIDER_SITE_OTHER): Payer: BC Managed Care – PPO | Admitting: Nurse Practitioner

## 2022-03-09 ENCOUNTER — Other Ambulatory Visit (HOSPITAL_COMMUNITY): Payer: Self-pay

## 2022-03-09 ENCOUNTER — Encounter: Payer: Self-pay | Admitting: Nurse Practitioner

## 2022-03-09 VITALS — BP 122/84 | HR 89 | Temp 96.8°F | Ht 64.0 in | Wt 278.0 lb

## 2022-03-09 DIAGNOSIS — I10 Essential (primary) hypertension: Secondary | ICD-10-CM

## 2022-03-09 DIAGNOSIS — Z0001 Encounter for general adult medical examination with abnormal findings: Secondary | ICD-10-CM | POA: Diagnosis not present

## 2022-03-09 DIAGNOSIS — E1165 Type 2 diabetes mellitus with hyperglycemia: Secondary | ICD-10-CM | POA: Diagnosis not present

## 2022-03-09 MED ORDER — OZEMPIC (2 MG/DOSE) 8 MG/3ML ~~LOC~~ SOPN
2.0000 mg | PEN_INJECTOR | SUBCUTANEOUS | 0 refills | Status: DC
  Filled 2022-03-09 – 2022-03-18 (×2): qty 3, 28d supply, fill #0
  Filled 2022-04-18: qty 3, 28d supply, fill #1
  Filled 2022-05-12: qty 3, 28d supply, fill #2

## 2022-03-09 NOTE — Patient Instructions (Signed)
Maintain med doses ? ?

## 2022-03-09 NOTE — Progress Notes (Signed)
Complete physical exam  Patient: Veronica [name]   DOB: April 21, 1970   51 y.o. Female  MRN: [id number] Visit Date: 03/09/2022  Subjective:    Chief Complaint  Patient presents with   Annual Exam    CPE Pt not fasting  Hoarseness     Veronica [name] is a 51 y.o. female who presents today for a complete physical exam. She reports consuming a low fat and low sodium diet.  Limited due to knee pain  She generally feels well. She reports sleeping well. She does have additional problems to discuss today.  Vision:Yes Dental:No STD Screen:No  BP Readings from Last 3 Encounters:  03/09/22 122/84  03/02/22 (!) 148/100  12/31/21 120/80    Wt Readings from Last 3 Encounters:  03/09/22 278 lb (126.1 kg)  03/02/22 284 lb 6.3 oz (129 kg)  02/09/22 284 lb 6.3 oz (129 kg)    Most recent fall risk assessment:    07/01/2021   10:29 AM  Fall Risk   Falls in the past year? 0  Number falls in past yr: 0  Injury with Fall? 0   Depression screen:Yes - No Depression  Most recent depression screenings:    03/09/2022    2:31 PM 03/04/2021    2:08 PM  PHQ 2/9 Scores  PHQ - 2 Score 0 0  PHQ- 9 Score 2 0    HPI  Morbid obesity ([place]) Lost 8lbs in last 27month. Lost total of 20lbs in last 677month Limited exercise due to severe bilateral OA of knees. Struggle with maintaining low fat/low carb diet. Unable to afford referral to nutritionist due to high copay. Last hgbA1c at 6.5% Wt Readings from Last 3 Encounters:  03/09/22 278 lb (126.1 kg)  03/02/22 284 lb 6.3 oz (129 kg)  02/09/22 284 lb 6.3 oz (129 kg)    Maintain Ozempic dose Advised to incorporate chair exercises, resistant band exercise, and water aerobics. F/up in 74m18month Past Medical History:  Diagnosis Date   Anxiety    Arthritis    Asthma    [place]  GERD (gastroesophageal reflux disease)    HTN (hypertension)    Obesity    Sleep apnea    Past Surgical History:  Procedure Laterality  Date   CESAREAN SECTION     x 3   CHOLECYSTECTOMY     COLONOSCOPY WITH PROPOFOL N/A 03/02/2022   Procedure: COLONOSCOPY WITH PROPOFOL;  Surgeon: BeaThornton ParkD;  Location: [facility];  Service: Gastroenterology;  Laterality: N/A;   IR ABLATE LIVER CRYOABLATION  08/21/2021   IR ABLATE LIVER CRYOABLATION  08/21/2021   IR ABLATE LIVER CRYOABLATION  08/21/2021   IR ABLATE LIVER CRYOABLATION  09/17/2021   IR ABLATE LIVER CRYOABLATION  09/17/2021   IR ABLATE LIVER CRYOABLATION  09/17/2021   IR ABLATE LIVER CRYOABLATION  09/17/2021   IR RADIOLOGIST EVAL & MGMT  08/04/2021   IR RADIOLOGIST EVAL & MGMT  09/11/2021   IR RADIOLOGIST EVAL & MGMT  10/13/2021   LAPAROSCOPIC GASTRIC BANDING     SUPRACERVICAL ABDOMINAL HYSTERECTOMY  2017   Social History   Socioeconomic History   Marital status: Married    Spouse name: Not on file   Number of children: 3   Years of education: Not on file   Highest education level: Not on file  Occupational History   Occupation: Teacher  Tobacco Use   Smoking status: Never   Smokeless tobacco: Never  Vaping Use  Vaping Use: Never used  Substance and Sexual Activity   Alcohol use: Not Currently   Drug use: Never   Sexual activity: Yes    Partners: Male    Birth control/protection: Surgical    Comment: Married 30 yrs  Other Topics Concern   Not on file  Social History Narrative   Not on file   Social Determinants of Health   Financial Resource Strain: Not on file  Food Insecurity: Not on file  Transportation Needs: Not on file  Physical Activity: Not on file  Stress: Not on file  Social Connections: Not on file  Intimate Partner Violence: Not on file   Family Status  Relation Name Status   Mother  Deceased   Father  Deceased   Sister  Alive   Brother  Alive   Brother  Alive   MGM  Deceased   MGF  Deceased   PGM  Deceased   PGF  Deceased   Daughter  Alive   Daughter  Alive   Son  Alive   Neg Hx  (Not Specified)   Family  History  Problem Relation Age of Onset   Diabetes Mother    Hypertension Mother    Asthma Mother    Diabetes Father    Hypertension Father    Throat cancer Father    Asthma Sister    Asthma Brother    Asthma Brother    Pulmonary embolism Brother    Stomach cancer Maternal Grandmother    Lung cancer Maternal Grandfather    Other Daughter        prediabetes   Colon cancer Neg Hx    Rectal cancer Neg Hx    Allergies  Allergen Reactions   Peanut-Containing Drug Products Anaphylaxis and Hives    All nuts    Soy Allergy Anaphylaxis   Percocet [Oxycodone-Acetaminophen] Hives and Swelling   Shellfish Allergy Hives and Swelling    Patient Care Team: Beula Joyner, Charlene Brooke, NP as PCP - General (Internal Medicine)   Medications: Outpatient Medications Prior to Visit  Medication Sig   albuterol (VENTOLIN HFA) 108 (90 Base) MCG/ACT inhaler Inhale 1-2 puffs into the lungs every 6 (six) hours as needed for wheezing or shortness of breath.   Ascorbic Acid (VITAMIN C) 1000 MG tablet Take 1,000 mg by mouth daily.   atorvastatin (LIPITOR) 20 MG tablet Take 1 tablet (20 mg total) by mouth daily.   Chlorphen-PE-Acetaminophen (TYLENOL ALLERGY MULTI-SYMPTOM PO) Take 2 tablets by mouth every 4 (four) hours as needed (allergies).   cyanocobalamin (VITAMIN B12) 1000 MCG tablet Take 1,000 mcg by mouth daily.   DULoxetine (CYMBALTA) 20 MG capsule Take 1 capsule (20 mg total) by mouth daily.   fluticasone (FLONASE) 50 MCG/ACT nasal spray Place 2 sprays into both nostrils daily. (Patient taking differently: Place 2 sprays into both nostrils daily as needed for allergies.)   losartan (COZAAR) 25 MG tablet TAKE 1 TABLET (25 MG TOTAL) BY MOUTH DAILY.   montelukast (SINGULAIR) 10 MG tablet Take 10 mg by mouth at bedtime.   Multiple Vitamin (MULTIVITAMIN) tablet Take 1 tablet by mouth daily.   polyethylene glycol (MIRALAX) 17 g packet Take 17 g by mouth daily. (Patient taking differently: Take 17 g by mouth  daily as needed for moderate constipation.)   [DISCONTINUED] Semaglutide, 2 MG/DOSE, (OZEMPIC, 2 MG/DOSE,) 8 MG/3ML SOPN Inject 2 mg into the skin once a week. (Patient taking differently: Inject 2 mg into the skin every Sunday.)   [DISCONTINUED] celecoxib (CELEBREX)  200 MG capsule Take 200 mg by mouth daily as needed for moderate pain. (Patient not taking: Reported on 03/09/2022)   No facility-administered medications prior to visit.    Review of Systems  Constitutional:  Negative for fever.  HENT:  Negative for congestion, postnasal drip, rhinorrhea, sinus pressure, sinus pain, sneezing and sore throat.   Eyes:        Negative for visual changes  Respiratory:  Negative for cough and shortness of breath.   Cardiovascular:  Negative for chest pain, palpitations and leg swelling.  Gastrointestinal:  Negative for blood in stool, constipation and diarrhea.  Genitourinary:  Negative for dysuria, frequency and urgency.  Musculoskeletal:  Negative for myalgias.  Skin:  Negative for rash.  Neurological:  Negative for dizziness and headaches.  Hematological:  Does not bruise/bleed easily.  Psychiatric/Behavioral:  Negative for suicidal ideas. The patient is not nervous/anxious.    Last CBC Lab Results  Component Value Date   WBC 5.9 03/11/2021   HGB 12.4 03/11/2021   HCT 38.0 03/11/2021   MCV 83.4 03/11/2021   RDW 16.0 (H) 03/11/2021   PLT 252.0 77/41/2878   Last metabolic panel Lab Results  Component Value Date   GLUCOSE 85 01/09/2022   NA 141 01/09/2022   K 3.5 01/09/2022   CL 105 01/09/2022   CO2 27 01/09/2022   BUN 14 01/09/2022   CREATININE 0.81 01/09/2022   CALCIUM 9.3 01/09/2022   PROT 7.2 01/09/2022   ALBUMIN 3.9 01/09/2022   BILITOT 0.4 01/09/2022   ALKPHOS 93 01/09/2022   AST 16 01/09/2022   ALT 15 01/09/2022   Last lipids Lab Results  Component Value Date   CHOL 131 01/09/2022   HDL 53.10 01/09/2022   LDLCALC 68 01/09/2022   TRIG 48.0 01/09/2022   CHOLHDL 2  01/09/2022   Last hemoglobin A1c Lab Results  Component Value Date   HGBA1C 6.5 01/09/2022   Last thyroid functions Lab Results  Component Value Date   TSH 1.52 03/11/2021   Last vitamin D No results found for: "25OHVITD2", "25OHVITD3", "VD25OH"      Objective:  BP 122/84 (BP Location: Right Arm, Patient Position: Sitting, Cuff Size: Normal)   Pulse 89   Temp (!) 96.8 F (36 C) (Temporal)   Ht _0  (1.626 m)   Wt 278 lb (126.1 kg)   LMP 06/15/2010   SpO2 98%   BMI 47.72 kg/m     Physical Exam Vitals reviewed.  Constitutional:      General: She is not in acute distress.    Appearance: She is obese.  HENT:     Right Ear: Tympanic membrane, ear canal and external ear normal.     Left Ear: Tympanic membrane, ear canal and external ear normal.     Nose: Nose normal.     Mouth/Throat:     Pharynx: No oropharyngeal exudate or posterior oropharyngeal erythema.  Eyes:     General: No scleral icterus.    Extraocular Movements: Extraocular movements intact.     Conjunctiva/sclera: Conjunctivae normal.     Pupils: Pupils are equal, round, and reactive to light.  Cardiovascular:     Rate and Rhythm: Normal rate and regular rhythm.     Pulses: Normal pulses.     Heart sounds: Normal heart sounds.  Pulmonary:     Effort: Pulmonary effort is normal. No respiratory distress.     Breath sounds: Normal breath sounds.  Abdominal:     General: Bowel sounds are normal. There  is no distension.     Palpations: Abdomen is soft.  Musculoskeletal:        General: Normal range of motion.     Cervical back: Normal range of motion and neck supple.     Right lower leg: No edema.     Left lower leg: No edema.  Lymphadenopathy:     Cervical: No cervical adenopathy.  Skin:    General: Skin is warm and dry.  Neurological:     Mental Status: She is alert and oriented to person, place, and time.  Psychiatric:        Mood and Affect: Mood normal.        Behavior: Behavior normal.         Thought Content: Thought content normal.      No results found for any visits on 03/09/22.    Assessment & Plan:    Routine Health Maintenance and Physical Exam  Immunization History  Administered Date(s) Administered   Fluad Quad(high Dose 65+) 12/31/2021   Influenza,inj,Quad PF,6+ Mos 03/04/2021   MODERNA COVID-19 SARS-COV-2 PEDS BIVALENT BOOSTER 6Y-11Y 04/26/2021, 12/13/2021   Moderna Sars-Covid-2 Vaccination 09/30/2019, 10/27/2019, 04/06/2020, 09/12/2020   PNEUMOCOCCAL CONJUGATE-20 03/11/2021   Tdap 03/04/2021   Zoster Recombinat (Shingrix) 03/11/2021, 07/31/2021   Health Maintenance  Topic Date Due   Hepatitis C Screening  03/10/2023 (Originally 04/15/1986)   HIV Screening  03/10/2023 (Originally 04/16/1983)   HEMOGLOBIN A1C  07/11/2022   Diabetic kidney evaluation - Urine ACR  08/01/2022   OPHTHALMOLOGY EXAM  09/12/2022   FOOT EXAM  09/30/2022   Diabetic kidney evaluation - eGFR measurement  01/10/2023   MAMMOGRAM  03/29/2023   PAP SMEAR-Modifier  09/16/2024   DTaP/Tdap/Td (2 - Td or Tdap) 03/05/2031   COLONOSCOPY (Pts 45-47yr Insurance coverage will need to be confirmed)  03/02/2032   INFLUENZA VACCINE  Completed   Zoster Vaccines- Shingrix  Completed   HPV VACCINES  Aged Out   COVID-19 Vaccine  Discontinued   Discussed health benefits of physical activity, and encouraged her to engage in regular exercise appropriate for her age and condition.  Problem List Items Addressed This Visit       Cardiovascular and Mediastinum   HTN (hypertension), benign     Endocrine   Type 2 diabetes mellitus with hyperglycemia, without long-term current use of insulin ([place]   Relevant Medications   Semaglutide, 2 MG/DOSE, (OZEMPIC, 2 MG/DOSE,) 8 MG/3ML SOPN     Other   [place]bid [place]    Lost 8lbs in last 232month Lost total of 20lbs in last 80m280monthLimited exercise due to severe bilateral OA of knees. Struggle with maintaining low fat/low carb diet. Unable to afford  referral to nutritionist due to high copay. Last hgbA1c at 6.5% Wt Readings from Last 3 Encounters:  03/09/22 278 lb (126.1 kg)  03/02/22 284 lb 6.3 oz (129 kg)  02/09/22 284 lb 6.3 oz (129 kg)    Maintain Ozempic dose Advised to incorporate chair exercises, resistant band exercise, and water aerobics. F/up in 56mo2month   Relevant Medications   Semaglutide, 2 MG/DOSE, (OZEMPIC, 2 MG/DOSE,) 8 MG/3ML SOPN   Other Visit Diagnoses     Encounter for preventative adult health care exam with abnormal findings    -  Primary      Return in about 3 months (around 06/08/2022) for HTN, DM, hyperlipidemia (fasting).     CharWilfred Lacy

## 2022-03-09 NOTE — Assessment & Plan Note (Addendum)
Lost 8lbs in last 47months. Lost total of 20lbs in last 46months. Limited exercise due to severe bilateral OA of knees. Struggle with maintaining low fat/low carb diet. Unable to afford referral to nutritionist due to high copay. Last hgbA1c at 6.5% Wt Readings from Last 3 Encounters:  03/09/22 278 lb (126.1 kg)  03/02/22 284 lb 6.3 oz (129 kg)  02/09/22 284 lb 6.3 oz (129 kg)    Maintain Ozempic dose Advised to incorporate chair exercises, resistant band exercise, and water aerobics. F/up in 40months

## 2022-03-11 ENCOUNTER — Encounter: Payer: Self-pay | Admitting: Nurse Practitioner

## 2022-03-11 DIAGNOSIS — M1712 Unilateral primary osteoarthritis, left knee: Secondary | ICD-10-CM | POA: Diagnosis not present

## 2022-03-11 DIAGNOSIS — M1711 Unilateral primary osteoarthritis, right knee: Secondary | ICD-10-CM | POA: Diagnosis not present

## 2022-03-11 DIAGNOSIS — M170 Bilateral primary osteoarthritis of knee: Secondary | ICD-10-CM | POA: Diagnosis not present

## 2022-03-18 ENCOUNTER — Other Ambulatory Visit (HOSPITAL_COMMUNITY): Payer: Self-pay

## 2022-03-18 DIAGNOSIS — M170 Bilateral primary osteoarthritis of knee: Secondary | ICD-10-CM | POA: Diagnosis not present

## 2022-03-18 DIAGNOSIS — M1711 Unilateral primary osteoarthritis, right knee: Secondary | ICD-10-CM | POA: Diagnosis not present

## 2022-03-18 DIAGNOSIS — M1712 Unilateral primary osteoarthritis, left knee: Secondary | ICD-10-CM | POA: Diagnosis not present

## 2022-03-20 ENCOUNTER — Encounter (HOSPITAL_BASED_OUTPATIENT_CLINIC_OR_DEPARTMENT_OTHER): Payer: Self-pay | Admitting: Physical Therapy

## 2022-03-20 ENCOUNTER — Ambulatory Visit (HOSPITAL_BASED_OUTPATIENT_CLINIC_OR_DEPARTMENT_OTHER): Payer: BC Managed Care – PPO | Attending: Orthopaedic Surgery | Admitting: Physical Therapy

## 2022-03-20 DIAGNOSIS — M25661 Stiffness of right knee, not elsewhere classified: Secondary | ICD-10-CM | POA: Insufficient documentation

## 2022-03-20 DIAGNOSIS — R262 Difficulty in walking, not elsewhere classified: Secondary | ICD-10-CM | POA: Diagnosis not present

## 2022-03-20 DIAGNOSIS — M6281 Muscle weakness (generalized): Secondary | ICD-10-CM | POA: Insufficient documentation

## 2022-03-20 DIAGNOSIS — M25662 Stiffness of left knee, not elsewhere classified: Secondary | ICD-10-CM | POA: Insufficient documentation

## 2022-03-20 NOTE — Therapy (Signed)
OUTPATIENT PHYSICAL THERAPY LOWER EXTREMITY EVALUATION   Patient Name: Veronica [name] MRN: [id number] DOB:12/19/1970, 51 y.o., female Today's Date: 03/20/2022  END OF SESSION:  PT End of Session - 03/20/22 1108     Visit Number 1    Number of Visits 17    Date for PT Re-Evaluation 05/15/22    Authorization Time Period 03/20/22 to 05/29/22    PT Start Time 1016    PT Stop Time 1058    PT Time Calculation (min) 42 min    Activity Tolerance Patient tolerated treatment well    Behavior During Therapy WFL for tasks assessed/performed             Past Medical History:  Diagnosis Date   Anxiety    Arthritis    Asthma    DM (diabetes mellitus) ([place])    GERD (gastroesophageal reflux disease)    HTN (hypertension)    Obesity    Sleep apnea    Past Surgical History:  Procedure Laterality Date   CESAREAN SECTION     x 3   CHOLECYSTECTOMY     COLONOSCOPY WITH PROPOFOL N/A 03/02/2022   Procedure: COLONOSCOPY WITH PROPOFOL;  Surgeon: Thornton Park, MD;  Location: [facility];  Service: Gastroenterology;  Laterality: N/A;   IR ABLATE LIVER CRYOABLATION  08/21/2021   IR ABLATE LIVER CRYOABLATION  08/21/2021   IR ABLATE LIVER CRYOABLATION  08/21/2021   IR ABLATE LIVER CRYOABLATION  09/17/2021   IR ABLATE LIVER CRYOABLATION  09/17/2021   IR ABLATE LIVER CRYOABLATION  09/17/2021   IR ABLATE LIVER CRYOABLATION  09/17/2021   IR RADIOLOGIST EVAL & MGMT  08/04/2021   IR RADIOLOGIST EVAL & MGMT  09/11/2021   IR RADIOLOGIST EVAL & MGMT  10/13/2021   LAPAROSCOPIC GASTRIC BANDING     SUPRACERVICAL ABDOMINAL HYSTERECTOMY  2017   Patient Active Problem List   Diagnosis Date Noted   Special screening for malignant neoplasms, colon 03/02/2022   Hyperlipidemia associated with type 2 diabetes mellitus ([place]) 09/29/2021   Vaginal septum 09/16/2021   Vitreomacular adhesion of both eyes 09/11/2021   Moderate persistent asthma with exacerbation 07/11/2021   HTN (hypertension), benign  07/11/2021   Type 2 diabetes mellitus with hyperglycemia, without long-term current use of insulin ([place]) 07/02/2021   Morbid obesity ([place]) 07/01/2021   Vaginal bleeding 05/12/2021   Pain in joint involving multiple sites 03/04/2021   History of colon polyps 03/04/2021   OSA (obstructive sleep apnea) 06/13/2020   GAD (generalized anxiety disorder) 04/10/2020   Memory difficulty 04/10/2020   Asthma 04/09/2020   S/P abdominal supracervical subtotal hysterectomy 04/09/2020   Insomnia 04/09/2020    PCP: Flossie Buffy NP   REFERRING PROVIDER: Melrose Nakayama, MD  REFERRING DIAG: M25.561 (ICD-10-CM) - Pain in right knee  THERAPY DIAG:  Difficulty walking  Muscle weakness (generalized)  Stiffness of left knee, not elsewhere classified  Stiffness of right knee, not elsewhere classified  Rationale for Evaluation and Treatment: Rehabilitation  ONSET DATE: 03/17/2022  SUBJECTIVE:   SUBJECTIVE STATEMENT: My knees are bothering me, I have no cartilage I'm bone on bone, I need dual knee replacement but we can only do one at a time. I need to lose about 40-50# more pounds, hoping to be able to have surgery at the end of May. Really here for prehab. Last Wednesday I got some gel injections, I also got these a year and a half ago but they did not work. Had nerve ablations in my knees but that  didn't work great either, helped a little on the left but not the right. Would like to be able to exercise more in general to help me lose weight prior to surgery.   PERTINENT HISTORY:  PAIN:  Are you having pain? Yes: NPRS scale: 3/10 sitting, 5/10 standing/walking; at worst very painful 9/10 Pain location: B knees  Pain description: dull ache all the time  Aggravating factors: WB  Relieving factors: medicines take edge off but no days without pain   PRECAUTIONS: None  WEIGHT BEARING RESTRICTIONS: No  FALLS:  Has patient fallen in last 6 months? No some close calls because of dogs    LIVING ENVIRONMENT: Lives with: lives with their spouse Lives in: House/apartment Stairs: no STE, has 2nd floor but rarely has to go upstairs  Has following equipment at home: Single point cane  OCCUPATION: teacher   PLOF: Independent, Independent with basic ADLs, Independent with gait, and Independent with transfers  PATIENT GOALS: get ready for surgery, find a way to be able to exercise more to lose weight for surgery, general prehab   NEXT MD VISIT:   OBJECTIVE:   DIAGNOSTIC FINDINGS:   PATIENT SURVEYS:  FOTO will do 2nd session   COGNITION: Overall cognitive status: Within functional limits for tasks assessed     SENSATION: DNT, reports intermittent numbness   EDEMA:  Chronic edema B knees, R tends to be more swollen   MUSCLE LENGTH:    POSTURE:   PALPATION:   LOWER EXTREMITY ROM:  Active ROM Right eval Left eval  Hip flexion    Hip extension    Hip abduction    Hip adduction    Hip internal rotation    Hip external rotation    Knee flexion 110* 106*  Knee extension 9* 12*  Ankle dorsiflexion    Ankle plantarflexion    Ankle inversion    Ankle eversion     (Blank rows = not tested)  LOWER EXTREMITY MMT:  MMT Right eval Left eval  Hip flexion 3 3  Hip extension    Hip abduction 3 3  Hip adduction    Hip internal rotation    Hip external rotation    Knee flexion 3- 3-  Knee extension 4- 4-  Ankle dorsiflexion    Ankle plantarflexion    Ankle inversion    Ankle eversion     (Blank rows = not tested)  LOWER EXTREMITY SPECIAL TESTS:    FUNCTIONAL TESTS:    GAIT: Distance walked: in clinic distances  Assistive device utilized: Single point cane Level of assistance: Modified independence Comments: + trendelenburg, antalgic    TODAY'S TREATMENT:                                                                                                                              DATE:   Eval  Objective measures + appropriate  education  TherEx  Bridges with red TB x10  Sidelying clams  red TB x10 LAQs yellow band x 5 B HS curls yellow band x5 B    PATIENT EDUCATION:  Education details: HEP, POC, exam findings  Person educated: Patient and Spouse Education method: Customer service manager Education comprehension: verbalized understanding and returned demonstration  HOME EXERCISE PROGRAM: Access Code: [email or web address] URL: [email or web address] Date: 03/20/2022 Prepared by: Deniece Ree  Exercises - Supine Bridge with Resistance Band  - 2 x daily - 7 x weekly - 1 sets - 10 reps - 1 hold - Clam with Resistance  - 2 x daily - 7 x weekly - 1 sets - 10 reps - 1 hold - Sitting Knee Extension with Resistance  - 2 x daily - 7 x weekly - 1 sets - 10 reps - 1 hold - Seated Hamstring Curl with Anchored Resistance  - 2 x daily - 7 x weekly - 1 sets - 10 reps - 1 hold  ASSESSMENT:  CLINICAL IMPRESSION: Patient is a 51 y.o. F who was seen today for physical therapy evaluation and treatment for B knee pain. Exam is as typical and expected given history and current complaints. Will benefit from skilled PT services to address functional impairments, reduce pain, and assist in preparation for surgery.   OBJECTIVE IMPAIRMENTS: Abnormal gait, difficulty walking, decreased ROM, decreased strength, increased edema, impaired flexibility, obesity, and pain.   ACTIVITY LIMITATIONS: squatting, stairs, transfers, and locomotion level  PARTICIPATION LIMITATIONS: community activity, occupation, yard work, and church  PERSONAL FACTORS: Age, Behavior pattern, and Fitness are also affecting patient's functional outcome.   REHAB POTENTIAL: Excellent  CLINICAL DECISION MAKING: Stable/uncomplicated  EVALUATION COMPLEXITY: Low   GOALS: Goals reviewed with patient? Yes  SHORT TERM GOALS: Target date: 04/17/2022   Will be compliant with appropriate progressive HEP  Baseline: Goal status: INITIAL  2.  Pain to  be no more than 7/10 at worst  Baseline:  Goal status: INITIAL  3.  B knee extension AROM to be no more than 5 degrees and flexion AROM to be no less than 115 degrees  Baseline:  Goal status: INITIAL   LONG TERM GOALS: Target date: 05/15/2022    MMT to be at least 4/5 in all tested groups  Baseline:  Goal status: INITIAL  2.  Pain to be no more than 5/10 at worst  Baseline:  Goal status: INITIAL  3.  Will be compliant in appropriate progressive prehab gym program (land or water) on an independent basis to assist in prehab prior to surgery and in weight loss efforts  Baseline:  Goal status: INITIAL    PLAN:  PT FREQUENCY: 1-2x/week  PT DURATION: 8 weeks  PLANNED INTERVENTIONS: Therapeutic exercises, Therapeutic activity, Neuromuscular re-education, Balance training, Gait training, Patient/Family education, Self Care, Joint mobilization, Stair training, Orthotic/Fit training, DME instructions, Aquatic Therapy, Dry Needling, Electrical stimulation, Spinal mobilization, Cryotherapy, Moist heat, Taping, Ultrasound, Ionotophoresis 4mg /ml Dexamethasone, Manual therapy, and Re-evaluation  PLAN FOR NEXT SESSION: strength and ROM, general prehab program, core strength    Shundra Wirsing U PT DPT PN2  03/20/2022, 11:10 AM

## 2022-03-25 ENCOUNTER — Encounter (HOSPITAL_BASED_OUTPATIENT_CLINIC_OR_DEPARTMENT_OTHER): Payer: Self-pay | Admitting: Physical Therapy

## 2022-03-25 ENCOUNTER — Ambulatory Visit (HOSPITAL_BASED_OUTPATIENT_CLINIC_OR_DEPARTMENT_OTHER): Payer: BC Managed Care – PPO | Attending: Orthopaedic Surgery | Admitting: Physical Therapy

## 2022-03-25 DIAGNOSIS — R262 Difficulty in walking, not elsewhere classified: Secondary | ICD-10-CM

## 2022-03-25 DIAGNOSIS — M25662 Stiffness of left knee, not elsewhere classified: Secondary | ICD-10-CM | POA: Diagnosis not present

## 2022-03-25 DIAGNOSIS — M25661 Stiffness of right knee, not elsewhere classified: Secondary | ICD-10-CM | POA: Insufficient documentation

## 2022-03-25 DIAGNOSIS — M6281 Muscle weakness (generalized): Secondary | ICD-10-CM

## 2022-03-25 NOTE — Therapy (Signed)
OUTPATIENT PHYSICAL THERAPY LOWER EXTREMITY EVALUATION   Patient Name: Veronica [name] MRN: [id number] DOB:26-Dec-1970, 52 y.o., female Today's Date: 03/25/2022  END OF SESSION:  PT End of Session - 03/25/22 1527     Visit Number 2    Number of Visits 17    Date for PT Re-Evaluation 05/15/22    Authorization Time Period 03/20/22 to 05/29/22    PT Start Time 1533    PT Stop Time 1615    PT Time Calculation (min) 42 min    Activity Tolerance Patient tolerated treatment well    Behavior During Therapy WFL for tasks assessed/performed             Past Medical History:  Diagnosis Date   Anxiety    Arthritis    Asthma    DM (diabetes mellitus) ([place])    GERD (gastroesophageal reflux disease)    HTN (hypertension)    Obesity    Sleep apnea    Past Surgical History:  Procedure Laterality Date   CESAREAN SECTION     x 3   CHOLECYSTECTOMY     COLONOSCOPY WITH PROPOFOL N/A 03/02/2022   Procedure: COLONOSCOPY WITH PROPOFOL;  Surgeon: Thornton Park, MD;  Location: [facility];  Service: Gastroenterology;  Laterality: N/A;   IR ABLATE LIVER CRYOABLATION  08/21/2021   IR ABLATE LIVER CRYOABLATION  08/21/2021   IR ABLATE LIVER CRYOABLATION  08/21/2021   IR ABLATE LIVER CRYOABLATION  09/17/2021   IR ABLATE LIVER CRYOABLATION  09/17/2021   IR ABLATE LIVER CRYOABLATION  09/17/2021   IR ABLATE LIVER CRYOABLATION  09/17/2021   IR RADIOLOGIST EVAL & MGMT  08/04/2021   IR RADIOLOGIST EVAL & MGMT  09/11/2021   IR RADIOLOGIST EVAL & MGMT  10/13/2021   LAPAROSCOPIC GASTRIC BANDING     SUPRACERVICAL ABDOMINAL HYSTERECTOMY  2017   Patient Active Problem List   Diagnosis Date Noted   Special screening for malignant neoplasms, colon 03/02/2022   Hyperlipidemia associated with type 2 diabetes mellitus ([place]) 09/29/2021   Vaginal septum 09/16/2021   Vitreomacular adhesion of both eyes 09/11/2021   Moderate persistent asthma with exacerbation 07/11/2021   HTN (hypertension), benign  07/11/2021   Type 2 diabetes mellitus with hyperglycemia, without long-term current use of insulin ([place]) 07/02/2021   Morbid obesity ([place]) 07/01/2021   Vaginal bleeding 05/12/2021   Pain in joint involving multiple sites 03/04/2021   History of colon polyps 03/04/2021   OSA (obstructive sleep apnea) 06/13/2020   GAD (generalized anxiety disorder) 04/10/2020   Memory difficulty 04/10/2020   Asthma 04/09/2020   S/P abdominal supracervical subtotal hysterectomy 04/09/2020   Insomnia 04/09/2020    PCP: Flossie Buffy NP   REFERRING PROVIDER: Melrose Nakayama, MD  REFERRING DIAG: M25.561 (ICD-10-CM) - Pain in right knee  THERAPY DIAG:  Difficulty walking  Muscle weakness (generalized)  Stiffness of left knee, not elsewhere classified  Stiffness of right knee, not elsewhere classified  Rationale for Evaluation and Treatment: Rehabilitation  ONSET DATE: 03/17/2022  SUBJECTIVE: "R knee hurting more today (?).  Went back to school today a little more tired and sore."  SUBJECTIVE STATEMENT: My knees are bothering me, I have no cartilage I'm bone on bone, I need dual knee replacement but we can only do one at a time. I need to lose about 40-50# more pounds, hoping to be able to have surgery at the end of May. Really here for prehab. Last Wednesday I got some gel injections, I also got these a year  and a half ago but they did not work. Had nerve ablations in my knees but that didn't work great either, helped a little on the left but not the right. Would like to be able to exercise more in general to help me lose weight prior to surgery.   PERTINENT HISTORY:  PAIN:  Are you having pain? Yes: NPRS scale: 4/10 sitting, 5/10 standing/walking; at worst very painful 9/10 Pain location: B knees  Pain description: dull ache all the time  Aggravating factors: WB  Relieving factors: medicines take edge off but no days without pain   PRECAUTIONS: None  WEIGHT BEARING RESTRICTIONS:  No  FALLS:  Has patient fallen in last 6 months? No some close calls because of dogs   LIVING ENVIRONMENT: Lives with: lives with their spouse Lives in: House/apartment Stairs: no STE, has 2nd floor but rarely has to go upstairs  Has following equipment at home: Single point cane  OCCUPATION: teacher   PLOF: Independent, Independent with basic ADLs, Independent with gait, and Independent with transfers  PATIENT GOALS: get ready for surgery, find a way to be able to exercise more to lose weight for surgery, general prehab   NEXT MD VISIT:   OBJECTIVE:   DIAGNOSTIC FINDINGS:   PATIENT SURVEYS:  FOTO will do 2nd session   03/25/22: 40% with goal of 55% COGNITION: Overall cognitive status: Within functional limits for tasks assessed     SENSATION: DNT, reports intermittent numbness   EDEMA:  Chronic edema B knees, R tends to be more swollen   MUSCLE LENGTH:    POSTURE:   PALPATION:   LOWER EXTREMITY ROM:  Active ROM Right eval Left eval  Hip flexion    Hip extension    Hip abduction    Hip adduction    Hip internal rotation    Hip external rotation    Knee flexion 110* 106*  Knee extension 9* 12*  Ankle dorsiflexion    Ankle plantarflexion    Ankle inversion    Ankle eversion     (Blank rows = not tested)  LOWER EXTREMITY MMT:  MMT Right eval Left eval  Hip flexion 3 3  Hip extension    Hip abduction 3 3  Hip adduction    Hip internal rotation    Hip external rotation    Knee flexion 3- 3-  Knee extension 4- 4-  Ankle dorsiflexion    Ankle plantarflexion    Ankle inversion    Ankle eversion     (Blank rows = not tested)  LOWER EXTREMITY SPECIAL TESTS:    FUNCTIONAL TESTS:    GAIT: Distance walked: in clinic distances  Assistive device utilized: Single point cane Level of assistance: Modified independence Comments: + trendelenburg, antalgic    TODAY'S TREATMENT:  DATE:   Pt seen for aquatic therapy today.  Treatment took place in water 3.25-4.5 ft in depth at the [facility]. Temp of water was 91.  Pt entered/exited the pool via stairs using step to pattern with hand rail.  Intro to setting Walking forward, back and side stepping - forward with knee flex/ext kicks x 2 widths UE support on white barbell: squats in 4 ft SLS R/L Stair tapping UE unsupported Seated on lift chair: cycling; hip add/abd; flutter kicking; LAQ x20 Core engagement: 1/2 noodle pull downs 2x10 Cycling on noodle ue support yellow hand buoys. Some difficulty maintaining sitting balance In corner straddling noodle add/abd    Pt requires the buoyancy and hydrostatic pressure of water for support, and to offload joints by unweighting joint load by at least 50 % in navel deep water and by at least 75-80% in chest to neck deep water.  Viscosity of the water is needed for resistance of strengthening. Water current perturbations provides challenge to standing balance requiring increased core activation.     PATIENT EDUCATION:  Education details: HEP, POC, exam findings  Person educated: Patient and Spouse Education method: Customer service manager Education comprehension: verbalized understanding and returned demonstration  HOME EXERCISE PROGRAM: Access Code: [email or web address] URL: [email or web address] Date: 03/20/2022 Prepared by: Deniece Ree  Exercises - Supine Bridge with Resistance Band  - 2 x daily - 7 x weekly - 1 sets - 10 reps - 1 hold - Clam with Resistance  - 2 x daily - 7 x weekly - 1 sets - 10 reps - 1 hold - Sitting Knee Extension with Resistance  - 2 x daily - 7 x weekly - 1 sets - 10 reps - 1 hold - Seated Hamstring Curl with Anchored Resistance  - 2 x daily - 7 x weekly - 1 sets - 10 reps - 1 hold  ASSESSMENT:  CLINICAL IMPRESSION: Pt is safe and indep in setting with  therapist instructing from deck. 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 She is directed through gentle exercises and balance challenges requiring vc and demonstration for proper execution.  She is challenged with gaining sitting balance on yellow noodle but is able to complete well enough to cycle.  She does report slight increase in knee pain with session.  She is a good candidate for aquatic therapy and will benefit from the properties of water to improve strength of LE/core, improve gait and balance and progress towards meeting goals.  Patient is a 52 y.o. F who was seen today for physical therapy evaluation and treatment for B knee pain. Exam is as typical and expected given history and current complaints. Will benefit from skilled PT services to address functional impairments, reduce pain, and assist in preparation for surgery.   OBJECTIVE IMPAIRMENTS: Abnormal gait, difficulty walking, decreased ROM, decreased strength, increased edema, impaired flexibility, obesity, and pain.   ACTIVITY LIMITATIONS: squatting, stairs, transfers, and locomotion level  PARTICIPATION LIMITATIONS: community activity, occupation, yard work, and church  PERSONAL FACTORS: Age, Behavior pattern, and Fitness are also affecting patient's functional outcome.   REHAB POTENTIAL: Excellent  CLINICAL DECISION MAKING: Stable/uncomplicated  EVALUATION COMPLEXITY: Low   GOALS: Goals reviewed with patient? Yes  SHORT TERM GOALS: Target date: 04/17/2022   Will be compliant with appropriate progressive HEP  Baseline: Goal status: INITIAL  2.  Pain to be no more than 7/10 at worst  Baseline:  Goal status: INITIAL  3.  B knee extension AROM to be no more than 5 degrees and flexion AROM to be no less than 115 degrees  Baseline:  Goal status: INITIAL   LONG TERM GOALS: Target date: 05/15/2022    MMT to be at least 4/5 in all tested groups  Baseline:  Goal status:  INITIAL  2.  Pain to be no more than 5/10 at worst  Baseline:  Goal status: INITIAL  3.  Will be compliant in appropriate progressive prehab gym program (land or water) on an independent basis to assist in prehab prior to surgery and in weight loss efforts  Baseline:  Goal status: INITIAL    PLAN:  PT FREQUENCY: 1-2x/week  PT DURATION: 8 weeks  PLANNED INTERVENTIONS: Therapeutic exercises, Therapeutic activity, Neuromuscular re-education, Balance training, Gait training, Patient/Family education, Self Care, Joint mobilization, Stair training, Orthotic/Fit training, DME instructions, Aquatic Therapy, Dry Needling, Electrical stimulation, Spinal mobilization, Cryotherapy, Moist heat, Taping, Ultrasound, Ionotophoresis 4mg /ml Dexamethasone, Manual therapy, and Re-evaluation  PLAN FOR NEXT SESSION: strength and ROM, general prehab program, core strength    (Frankie) Daegon Deiss MPT 03/25/22  253p

## 2022-04-01 ENCOUNTER — Encounter (HOSPITAL_BASED_OUTPATIENT_CLINIC_OR_DEPARTMENT_OTHER): Payer: Self-pay | Admitting: Physical Therapy

## 2022-04-01 ENCOUNTER — Ambulatory Visit (HOSPITAL_BASED_OUTPATIENT_CLINIC_OR_DEPARTMENT_OTHER): Payer: BC Managed Care – PPO | Admitting: Physical Therapy

## 2022-04-01 DIAGNOSIS — R262 Difficulty in walking, not elsewhere classified: Secondary | ICD-10-CM

## 2022-04-01 DIAGNOSIS — M25661 Stiffness of right knee, not elsewhere classified: Secondary | ICD-10-CM

## 2022-04-01 DIAGNOSIS — M6281 Muscle weakness (generalized): Secondary | ICD-10-CM | POA: Diagnosis not present

## 2022-04-01 DIAGNOSIS — M25662 Stiffness of left knee, not elsewhere classified: Secondary | ICD-10-CM | POA: Diagnosis not present

## 2022-04-01 NOTE — Therapy (Signed)
OUTPATIENT PHYSICAL THERAPY LOWER EXTREMITY EVALUATION   Patient Name: MERARI PION MRN: [id number] DOB:Aug 03, 1970, 52 y.o., female Today's Date: 04/01/2022  END OF SESSION:  PT End of Session - 04/01/22 1720     Visit Number 3    Number of Visits 17    Date for PT Re-Evaluation 05/15/22    Authorization Time Period 03/20/22 to 05/29/22    PT Start Time 1718    PT Stop Time 1800    PT Time Calculation (min) 42 min    Activity Tolerance Patient tolerated treatment well    Behavior During Therapy WFL for tasks assessed/performed             Past Medical History:  Diagnosis Date   Anxiety    Arthritis    Asthma    DM (diabetes mellitus) ([place])    GERD (gastroesophageal reflux disease)    HTN (hypertension)    Obesity    Sleep apnea    Past Surgical History:  Procedure Laterality Date   CESAREAN SECTION     x 3   CHOLECYSTECTOMY     COLONOSCOPY WITH PROPOFOL N/A 03/02/2022   Procedure: COLONOSCOPY WITH PROPOFOL;  Surgeon: Thornton Park, MD;  Location: [facility];  Service: Gastroenterology;  Laterality: N/A;   IR ABLATE LIVER CRYOABLATION  08/21/2021   IR ABLATE LIVER CRYOABLATION  08/21/2021   IR ABLATE LIVER CRYOABLATION  08/21/2021   IR ABLATE LIVER CRYOABLATION  09/17/2021   IR ABLATE LIVER CRYOABLATION  09/17/2021   IR ABLATE LIVER CRYOABLATION  09/17/2021   IR ABLATE LIVER CRYOABLATION  09/17/2021   IR RADIOLOGIST EVAL & MGMT  08/04/2021   IR RADIOLOGIST EVAL & MGMT  09/11/2021   IR RADIOLOGIST EVAL & MGMT  10/13/2021   LAPAROSCOPIC GASTRIC BANDING     SUPRACERVICAL ABDOMINAL HYSTERECTOMY  2017   Patient Active Problem List   Diagnosis Date Noted   Special screening for malignant neoplasms, colon 03/02/2022   Hyperlipidemia associated with type 2 diabetes mellitus ([place]) 09/29/2021   Vaginal septum 09/16/2021   Vitreomacular adhesion of both eyes 09/11/2021   Moderate persistent asthma with exacerbation 07/11/2021   HTN (hypertension), benign  07/11/2021   Type 2 diabetes mellitus with hyperglycemia, without long-term current use of insulin ([place]) 07/02/2021   Morbid obesity ([place]) 07/01/2021   Vaginal bleeding 05/12/2021   Pain in joint involving multiple sites 03/04/2021   History of colon polyps 03/04/2021   OSA (obstructive sleep apnea) 06/13/2020   GAD (generalized anxiety disorder) 04/10/2020   Memory difficulty 04/10/2020   Asthma 04/09/2020   S/P abdominal supracervical subtotal hysterectomy 04/09/2020   Insomnia 04/09/2020    PCP: Flossie Buffy NP   REFERRING PROVIDER: Melrose Nakayama, MD  REFERRING DIAG: M25.561 (ICD-10-CM) - Pain in right knee  THERAPY DIAG:  Difficulty walking  Muscle weakness (generalized)  Stiffness of left knee, not elsewhere classified  Stiffness of right knee, not elsewhere classified  Rationale for Evaluation and Treatment: Rehabilitation  ONSET DATE: 03/17/2022  SUBJECTIVE: "Hurt some after last session but it may have been the weather."  SUBJECTIVE STATEMENT: My knees are bothering me, I have no cartilage I'm bone on bone, I need dual knee replacement but we can only do one at a time. I need to lose about 40-50# more pounds, hoping to be able to have surgery at the end of May. Really here for prehab. Last Wednesday I got some gel injections, I also got these a year and a half ago but they  did not work. Had nerve ablations in my knees but that didn't work great either, helped a little on the left but not the right. Would like to be able to exercise more in general to help me lose weight prior to surgery.   PERTINENT HISTORY:  PAIN:  Are you having pain? Yes: NPRS scale: 3/10 sitting, 5/10 standing/walking; at worst very painful 9/10 Pain location: B knees  Pain description: dull ache all the time  Aggravating factors: WB  Relieving factors: medicines take edge off but no days without pain   PRECAUTIONS: None  WEIGHT BEARING RESTRICTIONS: No  FALLS:  Has patient  fallen in last 6 months? No some close calls because of dogs   LIVING ENVIRONMENT: Lives with: lives with their spouse Lives in: House/apartment Stairs: no STE, has 2nd floor but rarely has to go upstairs  Has following equipment at home: Single point cane  OCCUPATION: teacher   PLOF: Independent, Independent with basic ADLs, Independent with gait, and Independent with transfers  PATIENT GOALS: get ready for surgery, find a way to be able to exercise more to lose weight for surgery, general prehab   NEXT MD VISIT:   OBJECTIVE:   DIAGNOSTIC FINDINGS:   PATIENT SURVEYS:  FOTO will do 2nd session   03/25/22: 40% with goal of 55% COGNITION: Overall cognitive status: Within functional limits for tasks assessed     SENSATION: DNT, reports intermittent numbness   EDEMA:  Chronic edema B knees, R tends to be more swollen   MUSCLE LENGTH:    POSTURE:   PALPATION:   LOWER EXTREMITY ROM:  Active ROM Right eval Left eval  Hip flexion    Hip extension    Hip abduction    Hip adduction    Hip internal rotation    Hip external rotation    Knee flexion 110* 106*  Knee extension 9* 12*  Ankle dorsiflexion    Ankle plantarflexion    Ankle inversion    Ankle eversion     (Blank rows = not tested)  LOWER EXTREMITY MMT:  MMT Right eval Left eval  Hip flexion 3 3  Hip extension    Hip abduction 3 3  Hip adduction    Hip internal rotation    Hip external rotation    Knee flexion 3- 3-  Knee extension 4- 4-  Ankle dorsiflexion    Ankle plantarflexion    Ankle inversion    Ankle eversion     (Blank rows = not tested)  LOWER EXTREMITY SPECIAL TESTS:    FUNCTIONAL TESTS:    GAIT: Distance walked: in clinic distances  Assistive device utilized: Single point cane Level of assistance: Modified independence Comments: + trendelenburg, antalgic    TODAY'S TREATMENT:                                                                                                                               DATE:   Pt  seen for aquatic therapy today.  Treatment took place in water 3.25-4.5 ft in depth at the [facility] pool. Temp of water was 91.  Pt entered/exited the pool via stairs using step to pattern with hand rail.   *Walking forward, back and side stepping yellow hand buoys submerged for added core engagement *side lunges with UE add/abd yellow hand buoys x 4 widths *Seated on lift chair: cycling; hip add/abd; flutter kicking; LAQ x20 *step ups bottom step leading R then L x 10  UE support on white barbell: squats in 4 ft SLS R/L x 2-3 reps best hold on lle 20s *3 way tapping x 10 rep R/L *3 way kick R/L *hip flex/ext x 10 R/L ue support yellow hand buoys   Core engagement: 1/2 noodle pull downs 2x10 Cycling on noodle ue support yellow hand buoys. Some difficulty maintaining sitting balance In corner straddling noodle add/abd    Pt requires the buoyancy and hydrostatic pressure of water for support, and to offload joints by unweighting joint load by at least 50 % in navel deep water and by at least 75-80% in chest to neck deep water.  Viscosity of the water is needed for resistance of strengthening. Water current perturbations provides challenge to standing balance requiring increased core activation.     PATIENT EDUCATION:  Education details: HEP, POC, exam findings  Person educated: Patient and Spouse Education method: Medical illustrator Education comprehension: verbalized understanding and returned demonstration  HOME EXERCISE PROGRAM: Access Code: [email or web address] URL: [email or web address] Date: 03/20/2022 Prepared by: Nedra Hai  Exercises - Supine Bridge with Resistance Band  - 2 x daily - 7 x weekly - 1 sets - 10 reps - 1 hold - Clam with Resistance  - 2 x daily - 7 x weekly - 1 sets - 10 reps - 1 hold - Sitting Knee Extension with Resistance  - 2 x daily - 7 x weekly - 1 sets - 10 reps - 1 hold -  Seated Hamstring Curl with Anchored Resistance  - 2 x daily - 7 x weekly - 1 sets - 10 reps - 1 hold  ASSESSMENT:  CLINICAL IMPRESSION: Focus on balance and proprioceptive retaining. She reports increase in right knee pain last few days although she has returned to work as a Runner, broadcasting/film/video.  She reports limited completion of HEP due to increased pain.  She is encouraged to return to them decreasing reps as needed.  She VU. She demonstrates improvement with core control completing exercises but is challenged with activities in SLS.  Goals ongoing  Initial Assessment: Patient is a 52 y.o. F who was seen today for physical therapy evaluation and treatment for B knee pain. Exam is as typical and expected given history and current complaints. Will benefit from skilled PT services to address functional impairments, reduce pain, and assist in preparation for surgery.   OBJECTIVE IMPAIRMENTS: Abnormal gait, difficulty walking, decreased ROM, decreased strength, increased edema, impaired flexibility, obesity, and pain.   ACTIVITY LIMITATIONS: squatting, stairs, transfers, and locomotion level  PARTICIPATION LIMITATIONS: community activity, occupation, yard work, and church  PERSONAL FACTORS: Age, Behavior pattern, and Fitness are also affecting patient's functional outcome.   REHAB POTENTIAL: Excellent  CLINICAL DECISION MAKING: Stable/uncomplicated  EVALUATION COMPLEXITY: Low   GOALS: Goals reviewed with patient? Yes  SHORT TERM GOALS: Target date: 04/17/2022   Will be compliant with appropriate progressive HEP  Baseline: Goal status: INITIAL  2.  Pain to be no more than 7/10 at worst  Baseline:  Goal status: INITIAL  3.  B knee extension AROM to be no more than 5 degrees and flexion AROM to be no less than 115 degrees  Baseline:  Goal status: INITIAL   LONG TERM GOALS: Target date: 05/15/2022    MMT to be at least 4/5 in all tested groups  Baseline:  Goal status: INITIAL  2.  Pain  to be no more than 5/10 at worst  Baseline:  Goal status: INITIAL  3.  Will be compliant in appropriate progressive prehab gym program (land or water) on an independent basis to assist in prehab prior to surgery and in weight loss efforts  Baseline:  Goal status: INITIAL    PLAN:  PT FREQUENCY: 1-2x/week  PT DURATION: 8 weeks  PLANNED INTERVENTIONS: Therapeutic exercises, Therapeutic activity, Neuromuscular re-education, Balance training, Gait training, Patient/Family education, Self Care, Joint mobilization, Stair training, Orthotic/Fit training, DME instructions, Aquatic Therapy, Dry Needling, Electrical stimulation, Spinal mobilization, Cryotherapy, Moist heat, Taping, Ultrasound, Ionotophoresis 4mg /ml Dexamethasone, Manual therapy, and Re-evaluation  PLAN FOR NEXT SESSION: strength and ROM, general prehab program, core strength    Stanton Kidney (Frankie) Brando Taves MPT 04/01/21 607p

## 2022-04-02 ENCOUNTER — Encounter (HOSPITAL_BASED_OUTPATIENT_CLINIC_OR_DEPARTMENT_OTHER): Payer: Self-pay

## 2022-04-02 ENCOUNTER — Ambulatory Visit (HOSPITAL_BASED_OUTPATIENT_CLINIC_OR_DEPARTMENT_OTHER): Payer: BC Managed Care – PPO | Admitting: Physical Therapy

## 2022-04-06 ENCOUNTER — Encounter (HOSPITAL_BASED_OUTPATIENT_CLINIC_OR_DEPARTMENT_OTHER): Payer: Self-pay | Admitting: Physical Therapy

## 2022-04-06 ENCOUNTER — Ambulatory Visit (HOSPITAL_BASED_OUTPATIENT_CLINIC_OR_DEPARTMENT_OTHER): Payer: BC Managed Care – PPO | Admitting: Physical Therapy

## 2022-04-06 DIAGNOSIS — R262 Difficulty in walking, not elsewhere classified: Secondary | ICD-10-CM

## 2022-04-06 DIAGNOSIS — M25661 Stiffness of right knee, not elsewhere classified: Secondary | ICD-10-CM | POA: Diagnosis not present

## 2022-04-06 DIAGNOSIS — M25662 Stiffness of left knee, not elsewhere classified: Secondary | ICD-10-CM

## 2022-04-06 DIAGNOSIS — M6281 Muscle weakness (generalized): Secondary | ICD-10-CM

## 2022-04-06 NOTE — Therapy (Signed)
OUTPATIENT PHYSICAL THERAPY LOWER EXTREMITY EVALUATION   Patient Name: Veronica [name] MRN: [id number] DOB:24-Feb-1971, 52 y.o., female Today's Date: 04/06/2022  END OF SESSION:  PT End of Session - 04/06/22 1056     Visit Number 4    Number of Visits 17    Date for PT Re-Evaluation 05/15/22    Authorization Time Period 03/20/22 to 05/29/22    PT Start Time 0900    PT Stop Time 0945    PT Time Calculation (min) 45 min    Activity Tolerance Patient tolerated treatment well    Behavior During Therapy WFL for tasks assessed/performed             Past Medical History:  Diagnosis Date   Anxiety    Arthritis    Asthma    DM (diabetes mellitus) (HCC)    GERD (gastroesophageal reflux disease)    HTN (hypertension)    Obesity    Sleep apnea    Past Surgical History:  Procedure Laterality Date   CESAREAN SECTION     x 3   CHOLECYSTECTOMY     COLONOSCOPY WITH PROPOFOL N/A 03/02/2022   Procedure: COLONOSCOPY WITH PROPOFOL;  Surgeon: Tressia Danas, MD;  Location: [facility];  Service: Gastroenterology;  Laterality: N/A;   IR ABLATE LIVER CRYOABLATION  08/21/2021   IR ABLATE LIVER CRYOABLATION  08/21/2021   IR ABLATE LIVER CRYOABLATION  08/21/2021   IR ABLATE LIVER CRYOABLATION  09/17/2021   IR ABLATE LIVER CRYOABLATION  09/17/2021   IR ABLATE LIVER CRYOABLATION  09/17/2021   IR ABLATE LIVER CRYOABLATION  09/17/2021   IR RADIOLOGIST EVAL & MGMT  08/04/2021   IR RADIOLOGIST EVAL & MGMT  09/11/2021   IR RADIOLOGIST EVAL & MGMT  10/13/2021   LAPAROSCOPIC GASTRIC BANDING     SUPRACERVICAL ABDOMINAL HYSTERECTOMY  2017   Patient Active Problem List   Diagnosis Date Noted   Special screening for malignant neoplasms, colon 03/02/2022   Hyperlipidemia associated with type 2 diabetes mellitus (HCC) 09/29/2021   Vaginal septum 09/16/2021   Vitreomacular adhesion of both eyes 09/11/2021   Moderate persistent asthma with exacerbation 07/11/2021   HTN (hypertension), benign  07/11/2021   Type 2 diabetes mellitus with hyperglycemia, without long-term current use of insulin (HCC) 07/02/2021   Morbid obesity (HCC) 07/01/2021   Vaginal bleeding 05/12/2021   Pain in joint involving multiple sites 03/04/2021   History of colon polyps 03/04/2021   OSA (obstructive sleep apnea) 06/13/2020   GAD (generalized anxiety disorder) 04/10/2020   Memory difficulty 04/10/2020   Asthma 04/09/2020   S/P abdominal supracervical subtotal hysterectomy 04/09/2020   Insomnia 04/09/2020    PCP: Anne Ng NP   REFERRING PROVIDER: Marcene Corning, MD  REFERRING DIAG: M25.561 (ICD-10-CM) - Pain in right knee  THERAPY DIAG:  Difficulty walking  Muscle weakness (generalized)  Stiffness of left knee, not elsewhere classified  Stiffness of right knee, not elsewhere classified  Rationale for Evaluation and Treatment: Rehabilitation  ONSET DATE: 03/17/2022  SUBJECTIVE: "Was able to pick up the laundry basket and carry it the other day, couldn't do it before"  SUBJECTIVE STATEMENT: My knees are bothering me, I have no cartilage I'm bone on bone, I need dual knee replacement but we can only do one at a time. I need to lose about 40-50# more pounds, hoping to be able to have surgery at the end of May. Really here for prehab. Last Wednesday I got some gel injections, I also got these a year  and a half ago but they did not work. Had nerve ablations in my knees but that didn't work great either, helped a little on the left but not the right. Would like to be able to exercise more in general to help me lose weight prior to surgery.   PERTINENT HISTORY:  PAIN:  Are you having pain? Yes: NPRS scale: 3/10 sitting, 5/10 standing/walking; at worst very painful 9/10 Pain location: B knees  Pain description: dull ache all the time  Aggravating factors: WB  Relieving factors: medicines take edge off but no days without pain   PRECAUTIONS: None  WEIGHT BEARING RESTRICTIONS:  No  FALLS:  Has patient fallen in last 6 months? No some close calls because of dogs   LIVING ENVIRONMENT: Lives with: lives with their spouse Lives in: House/apartment Stairs: no STE, has 2nd floor but rarely has to go upstairs  Has following equipment at home: Single point cane  OCCUPATION: teacher   PLOF: Independent, Independent with basic ADLs, Independent with gait, and Independent with transfers  PATIENT GOALS: get ready for surgery, find a way to be able to exercise more to lose weight for surgery, general prehab   NEXT MD VISIT:   OBJECTIVE:   DIAGNOSTIC FINDINGS:   PATIENT SURVEYS:  FOTO will do 2nd session   03/25/22: 40% with goal of 55% COGNITION: Overall cognitive status: Within functional limits for tasks assessed     SENSATION: DNT, reports intermittent numbness   EDEMA:  Chronic edema B knees, R tends to be more swollen   MUSCLE LENGTH:    POSTURE:   PALPATION:   LOWER EXTREMITY ROM:  Active ROM Right eval Left eval  Hip flexion    Hip extension    Hip abduction    Hip adduction    Hip internal rotation    Hip external rotation    Knee flexion 110* 106*  Knee extension 9* 12*  Ankle dorsiflexion    Ankle plantarflexion    Ankle inversion    Ankle eversion     (Blank rows = not tested)  LOWER EXTREMITY MMT:  MMT Right eval Left eval  Hip flexion 3 3  Hip extension    Hip abduction 3 3  Hip adduction    Hip internal rotation    Hip external rotation    Knee flexion 3- 3-  Knee extension 4- 4-  Ankle dorsiflexion    Ankle plantarflexion    Ankle inversion    Ankle eversion     (Blank rows = not tested)  LOWER EXTREMITY SPECIAL TESTS:    FUNCTIONAL TESTS:    GAIT: Distance walked: in clinic distances  Assistive device utilized: Single point cane Level of assistance: Modified independence Comments: + trendelenburg, antalgic    TODAY'S TREATMENT:  DATE:   Pt seen for aquatic therapy today.  Treatment took place in water 3.25-4.5 ft in depth at the [facility]. Temp of water was 87.  Pt entered/exited the pool via stairs using step to pattern with hand rail.   *Walking forward, back and side stepping yellow hand buoys submerged for added core engagement *Standing UE support white barbell strengthening and Balance: df, pf; add/abd; hip flex; hip extension; mini squats *side lunges with UE add/abd 1 foam hand buoys x 4 widths *Seated on steps: cycling; hip add/abd; flutter kicking; LAQ x20 *step ups bottom step leading R then L x 10 *STS 80% submerged. Cues for weight shifting and execution as well as immediate standing balance   Pt requires the buoyancy and hydrostatic pressure of water for support, and to offload joints by unweighting joint load by at least 50 % in navel deep water and by at least 75-80% in chest to neck deep water.  Viscosity of the water is needed for resistance of strengthening. Water current perturbations provides challenge to standing balance requiring increased core activation.     PATIENT EDUCATION:  Education details: HEP, POC, exam findings  Person educated: Patient and Spouse Education method: Customer service manager Education comprehension: verbalized understanding and returned demonstration  HOME EXERCISE PROGRAM: Access Code: [email or web address] URL: [email or web address] Date: 03/20/2022 Prepared by: Deniece Ree  Exercises - Supine Bridge with Resistance Band  - 2 x daily - 7 x weekly - 1 sets - 10 reps - 1 hold - Clam with Resistance  - 2 x daily - 7 x weekly - 1 sets - 10 reps - 1 hold - Sitting Knee Extension with Resistance  - 2 x daily - 7 x weekly - 1 sets - 10 reps - 1 hold - Seated Hamstring Curl with Anchored Resistance  - 2 x daily - 7 x weekly - 1 sets - 10 reps - 1 hold  ASSESSMENT:  CLINICAL  IMPRESSION: Some overall decrease in pain.  Varies with activities/work schedule daily.  Max pain 7-8/10 down from 9/10. She tolerates progression of exercises well. Able to complete STS with vc and demonstration from low position working strength and immediate standing balance. Able to complete step ups without UE support. Encouraged core stabilization throughout session.    Initial Assessment: Patient is a 52 y.o. F who was seen today for physical therapy evaluation and treatment for B knee pain. Exam is as typical and expected given history and current complaints. Will benefit from skilled PT services to address functional impairments, reduce pain, and assist in preparation for surgery.   OBJECTIVE IMPAIRMENTS: Abnormal gait, difficulty walking, decreased ROM, decreased strength, increased edema, impaired flexibility, obesity, and pain.   ACTIVITY LIMITATIONS: squatting, stairs, transfers, and locomotion level  PARTICIPATION LIMITATIONS: community activity, occupation, yard work, and church  PERSONAL FACTORS: Age, Behavior pattern, and Fitness are also affecting patient's functional outcome.   REHAB POTENTIAL: Excellent  CLINICAL DECISION MAKING: Stable/uncomplicated  EVALUATION COMPLEXITY: Low   GOALS: Goals reviewed with patient? Yes  SHORT TERM GOALS: Target date: 04/17/2022   Will be compliant with appropriate progressive HEP  Baseline: Goal status: INITIAL  2.  Pain to be no more than 7/10 at worst  Baseline:  Goal status: INITIAL  3.  B knee extension AROM to be no more than 5 degrees and flexion AROM to be no less than 115 degrees  Baseline:  Goal status: INITIAL   LONG TERM GOALS: Target date: 05/15/2022    MMT  to be at least 4/5 in all tested groups  Baseline:  Goal status: INITIAL  2.  Pain to be no more than 5/10 at worst  Baseline:  Goal status: INITIAL  3.  Will be compliant in appropriate progressive prehab gym program (land or water) on an  independent basis to assist in prehab prior to surgery and in weight loss efforts  Baseline:  Goal status: INITIAL    PLAN:  PT FREQUENCY: 1-2x/week  PT DURATION: 8 weeks  PLANNED INTERVENTIONS: Therapeutic exercises, Therapeutic activity, Neuromuscular re-education, Balance training, Gait training, Patient/Family education, Self Care, Joint mobilization, Stair training, Orthotic/Fit training, DME instructions, Aquatic Therapy, Dry Needling, Electrical stimulation, Spinal mobilization, Cryotherapy, Moist heat, Taping, Ultrasound, Ionotophoresis 4mg /ml Dexamethasone, Manual therapy, and Re-evaluation  PLAN FOR NEXT SESSION: strength and ROM, general prehab program, core strength    (Frankie) Berit Raczkowski MPT 04/06/21 11:07am

## 2022-04-08 ENCOUNTER — Ambulatory Visit (HOSPITAL_BASED_OUTPATIENT_CLINIC_OR_DEPARTMENT_OTHER): Payer: BC Managed Care – PPO | Admitting: Physical Therapy

## 2022-04-08 ENCOUNTER — Encounter (HOSPITAL_BASED_OUTPATIENT_CLINIC_OR_DEPARTMENT_OTHER): Payer: Self-pay

## 2022-04-14 ENCOUNTER — Ambulatory Visit (HOSPITAL_BASED_OUTPATIENT_CLINIC_OR_DEPARTMENT_OTHER): Payer: BC Managed Care – PPO | Admitting: Physical Therapy

## 2022-04-16 ENCOUNTER — Ambulatory Visit (HOSPITAL_BASED_OUTPATIENT_CLINIC_OR_DEPARTMENT_OTHER): Payer: BC Managed Care – PPO | Admitting: Physical Therapy

## 2022-04-16 ENCOUNTER — Encounter (HOSPITAL_BASED_OUTPATIENT_CLINIC_OR_DEPARTMENT_OTHER): Payer: Self-pay | Admitting: Physical Therapy

## 2022-04-16 DIAGNOSIS — M25662 Stiffness of left knee, not elsewhere classified: Secondary | ICD-10-CM | POA: Diagnosis not present

## 2022-04-16 DIAGNOSIS — M6281 Muscle weakness (generalized): Secondary | ICD-10-CM | POA: Diagnosis not present

## 2022-04-16 DIAGNOSIS — M25661 Stiffness of right knee, not elsewhere classified: Secondary | ICD-10-CM | POA: Diagnosis not present

## 2022-04-16 DIAGNOSIS — R262 Difficulty in walking, not elsewhere classified: Secondary | ICD-10-CM | POA: Diagnosis not present

## 2022-04-16 NOTE — Therapy (Signed)
OUTPATIENT PHYSICAL THERAPY LOWER EXTREMITY EVALUATION   Patient Name: Veronica [name] MRN: [id number] DOB:09/17/1970, 52 y.o., female Today's Date: 04/16/2022  END OF SESSION:  PT End of Session - 04/16/22 1627     Visit Number 5    Number of Visits 17    Date for PT Re-Evaluation 05/15/22    Authorization Time Period 03/20/22 to 05/29/22    PT Start Time 1620    PT Stop Time 1700    PT Time Calculation (min) 40 min    Activity Tolerance Patient tolerated treatment well    Behavior During Therapy WFL for tasks assessed/performed             Past Medical History:  Diagnosis Date   Anxiety    Arthritis    Asthma    DM (diabetes mellitus) ([place])    GERD (gastroesophageal reflux disease)    HTN (hypertension)    Obesity    Sleep apnea    Past Surgical History:  Procedure Laterality Date   CESAREAN SECTION     x 3   CHOLECYSTECTOMY     COLONOSCOPY WITH PROPOFOL N/A 03/02/2022   Procedure: COLONOSCOPY WITH PROPOFOL;  Surgeon: Thornton Park, MD;  Location: [facility];  Service: Gastroenterology;  Laterality: N/A;   IR ABLATE LIVER CRYOABLATION  08/21/2021   IR ABLATE LIVER CRYOABLATION  08/21/2021   IR ABLATE LIVER CRYOABLATION  08/21/2021   IR ABLATE LIVER CRYOABLATION  09/17/2021   IR ABLATE LIVER CRYOABLATION  09/17/2021   IR ABLATE LIVER CRYOABLATION  09/17/2021   IR ABLATE LIVER CRYOABLATION  09/17/2021   IR RADIOLOGIST EVAL & MGMT  08/04/2021   IR RADIOLOGIST EVAL & MGMT  09/11/2021   IR RADIOLOGIST EVAL & MGMT  10/13/2021   LAPAROSCOPIC GASTRIC BANDING     SUPRACERVICAL ABDOMINAL HYSTERECTOMY  2017   Patient Active Problem List   Diagnosis Date Noted   Special screening for malignant neoplasms, colon 03/02/2022   Hyperlipidemia associated with type 2 diabetes mellitus ([place]) 09/29/2021   Vaginal septum 09/16/2021   Vitreomacular adhesion of both eyes 09/11/2021   Moderate persistent asthma with exacerbation 07/11/2021   HTN (hypertension), benign  07/11/2021   Type 2 diabetes mellitus with hyperglycemia, without long-term current use of insulin ([place]) 07/02/2021   Morbid obesity ([place]) 07/01/2021   Vaginal bleeding 05/12/2021   Pain in joint involving multiple sites 03/04/2021   History of colon polyps 03/04/2021   OSA (obstructive sleep apnea) 06/13/2020   GAD (generalized anxiety disorder) 04/10/2020   Memory difficulty 04/10/2020   Asthma 04/09/2020   S/P abdominal supracervical subtotal hysterectomy 04/09/2020   Insomnia 04/09/2020    PCP: Flossie Buffy NP   REFERRING PROVIDER: Melrose Nakayama, MD  REFERRING DIAG: M25.561 (ICD-10-CM) - Pain in right knee  THERAPY DIAG:  Difficulty walking  Muscle weakness (generalized)  Stiffness of left knee, not elsewhere classified  Rationale for Evaluation and Treatment: Rehabilitation  ONSET DATE: 03/17/2022  SUBJECTIVE: "I did something to my back on my ways here and it is hurting.  My knees have been flared the last few days"  SUBJECTIVE STATEMENT: My knees are bothering me, I have no cartilage I'm bone on bone, I need dual knee replacement but we can only do one at a time. I need to lose about 40-50# more pounds, hoping to be able to have surgery at the end of May. Really here for prehab. Last Wednesday I got some gel injections, I also got these a year and a  half ago but they did not work. Had nerve ablations in my knees but that didn't work great either, helped a little on the left but not the right. Would like to be able to exercise more in general to help me lose weight prior to surgery.   PERTINENT HISTORY:  PAIN:  Are you having pain? Yes: NPRS scale: 4/10 sitting, 5/10 standing/walking; at worst very painful 9/10 Pain location: B knees  Pain description: dull ache all the time  Aggravating factors: WB  Relieving factors: medicines take edge off but no days without pain   PRECAUTIONS: None  WEIGHT BEARING RESTRICTIONS: No  FALLS:  Has patient fallen in  last 6 months? No some close calls because of dogs   LIVING ENVIRONMENT: Lives with: lives with their spouse Lives in: House/apartment Stairs: no STE, has 2nd floor but rarely has to go upstairs  Has following equipment at home: Single point cane  OCCUPATION: teacher   PLOF: Independent, Independent with basic ADLs, Independent with gait, and Independent with transfers  PATIENT GOALS: get ready for surgery, find a way to be able to exercise more to lose weight for surgery, general prehab   NEXT MD VISIT:   OBJECTIVE:   DIAGNOSTIC FINDINGS:   PATIENT SURVEYS:  FOTO will do 2nd session   03/25/22: 40% with goal of 55% COGNITION: Overall cognitive status: Within functional limits for tasks assessed     SENSATION: DNT, reports intermittent numbness   EDEMA:  Chronic edema B knees, R tends to be more swollen   MUSCLE LENGTH:    POSTURE:   PALPATION:   LOWER EXTREMITY ROM:  Active ROM Right eval Left eval Right / Left 04/16/22  Hip flexion     Hip extension     Hip abduction     Hip adduction     Hip internal rotation     Hip external rotation     Knee flexion 110* 106* 115 / 110  Knee extension 9* 12* 0 / -8  Ankle dorsiflexion     Ankle plantarflexion     Ankle inversion     Ankle eversion      (Blank rows = not tested)  LOWER EXTREMITY MMT:  MMT Right eval Left eval Right / Left 04/16/22  Hip flexion 3 3 4-  Hip extension     Hip abduction 3 3 4-  Hip adduction     Hip internal rotation     Hip external rotation     Knee flexion 3- 3-  3+  Knee extension 4- 4- 4+  Ankle dorsiflexion     Ankle plantarflexion     Ankle inversion     Ankle eversion      (Blank rows = not tested)  LOWER EXTREMITY SPECIAL TESTS:    FUNCTIONAL TESTS:    GAIT: Distance walked: in clinic distances  Assistive device utilized: Single point cane Level of assistance: Modified independence Comments: + trendelenburg, antalgic    TODAY'S TREATMENT:  DATE:   Pt seen for aquatic therapy today.  Treatment took place in water 3.25-4.5 ft in depth at the [facility]. Temp of water was 87.  Pt entered/exited the pool via stairs using step to pattern with hand rail.   *Walking forward, back and side stepping yellow hand buoys submerged for added core engagement *Standing UE support 1 foam hand buoy strengthening and Balance: df, pf; add/abd; hip flex/ extension; mini squats. *side lunges with UE add/abd 1 foam hand buoys x 4 widths *step ups bottom step leading R then L x 10 without UE support *Seated on steps: cycling; hip add/abd; flutter kicking; LAQ x20 *adductor sets using BB 10 x 5 s hold *STS 80% submerged. Gains immediate standing balance 9/10x   Pt requires the buoyancy and hydrostatic pressure of water for support, and to offload joints by unweighting joint load by at least 50 % in navel deep water and by at least 75-80% in chest to neck deep water.  Viscosity of the water is needed for resistance of strengthening. Water current perturbations provides challenge to standing balance requiring increased core activation.     PATIENT EDUCATION:  Education details: HEP, POC, exam findings  Person educated: Patient and Spouse Education method: Customer service manager Education comprehension: verbalized understanding and returned demonstration  HOME EXERCISE PROGRAM: Access Code: [email or web address] URL: [email or web address] Date: 03/20/2022 Prepared by: Deniece Ree  Exercises - Supine Bridge with Resistance Band  - 2 x daily - 7 x weekly - 1 sets - 10 reps - 1 hold - Clam with Resistance  - 2 x daily - 7 x weekly - 1 sets - 10 reps - 1 hold - Sitting Knee Extension with Resistance  - 2 x daily - 7 x weekly - 1 sets - 10 reps - 1 hold - Seated Hamstring Curl with Anchored Resistance  - 2 x daily  - 7 x weekly - 1 sets - 10 reps - 1 hold  ASSESSMENT:  CLINICAL IMPRESSION: Ortho appt Monday.  Pt not certain if last injections have made a difference. Continues on weight loss journey continues.  She is hoping to have knee surgery at end of May. She has some back pain today but tolerates progression of tasks well.  Decreased ue support with standing exercises increasing core firing and re-training proprioception. Continued imrovement with knee strength as per MMT and progressed completion of STS from less submerged level.   STG addressed.  She is progressing slowly although has missed a few sessions due to pool maintenance and schedule conflicts.      Initial Assessment: Patient is a 52 y.o. F who was seen today for physical therapy evaluation and treatment for B knee pain. Exam is as typical and expected given history and current complaints. Will benefit from skilled PT services to address functional impairments, reduce pain, and assist in preparation for surgery.   OBJECTIVE IMPAIRMENTS: Abnormal gait, difficulty walking, decreased ROM, decreased strength, increased edema, impaired flexibility, obesity, and pain.   ACTIVITY LIMITATIONS: squatting, stairs, transfers, and locomotion level  PARTICIPATION LIMITATIONS: community activity, occupation, yard work, and church  PERSONAL FACTORS: Age, Behavior pattern, and Fitness are also affecting patient's functional outcome.   REHAB POTENTIAL: Excellent  CLINICAL DECISION MAKING: Stable/uncomplicated  EVALUATION COMPLEXITY: Low   GOALS: Goals reviewed with patient? Yes  SHORT TERM GOALS: Target date: 04/17/2022   Will be compliant with appropriate progressive HEP  Baseline: Goal status: ongoing 04/16/22  2.  Pain to be no more than  7/10 at worst  Baseline:  Goal status: Met 04/16/22  3.  B knee extension AROM to be no more than -5 degrees and flexion AROM to be no less than 115 degrees  Baseline:  Goal status: Partially met  (RLE not LLE)   LONG TERM GOALS: Target date: 05/15/2022    MMT to be at least 4/5 in all tested groups  Baseline:  Goal status: INITIAL  2.  Pain to be no more than 5/10 at worst  Baseline:  Goal status: INITIAL  3.  Will be compliant in appropriate progressive prehab gym program (land or water) on an independent basis to assist in prehab prior to surgery and in weight loss efforts  Baseline:  Goal status: INITIAL    PLAN:  PT FREQUENCY: 1-2x/week  PT DURATION: 8 weeks  PLANNED INTERVENTIONS: Therapeutic exercises, Therapeutic activity, Neuromuscular re-education, Balance training, Gait training, Patient/Family education, Self Care, Joint mobilization, Stair training, Orthotic/Fit training, DME instructions, Aquatic Therapy, Dry Needling, Electrical stimulation, Spinal mobilization, Cryotherapy, Moist heat, Taping, Ultrasound, Ionotophoresis 4mg /ml Dexamethasone, Manual therapy, and Re-evaluation  PLAN FOR NEXT SESSION: strength and ROM, general prehab program, core strength    (Frankie) Ohn Bostic MPT 04/16/22 430p

## 2022-04-20 ENCOUNTER — Other Ambulatory Visit (HOSPITAL_COMMUNITY): Payer: Self-pay

## 2022-04-20 ENCOUNTER — Encounter (HOSPITAL_BASED_OUTPATIENT_CLINIC_OR_DEPARTMENT_OTHER): Payer: BC Managed Care – PPO | Admitting: Physical Therapy

## 2022-04-20 DIAGNOSIS — M170 Bilateral primary osteoarthritis of knee: Secondary | ICD-10-CM | POA: Diagnosis not present

## 2022-04-21 ENCOUNTER — Other Ambulatory Visit (HOSPITAL_COMMUNITY): Payer: Self-pay

## 2022-04-21 ENCOUNTER — Ambulatory Visit (HOSPITAL_BASED_OUTPATIENT_CLINIC_OR_DEPARTMENT_OTHER): Payer: BC Managed Care – PPO | Admitting: Physical Therapy

## 2022-04-21 ENCOUNTER — Encounter (HOSPITAL_BASED_OUTPATIENT_CLINIC_OR_DEPARTMENT_OTHER): Payer: Self-pay | Admitting: Physical Therapy

## 2022-04-21 DIAGNOSIS — M6281 Muscle weakness (generalized): Secondary | ICD-10-CM | POA: Diagnosis not present

## 2022-04-21 DIAGNOSIS — M25661 Stiffness of right knee, not elsewhere classified: Secondary | ICD-10-CM

## 2022-04-21 DIAGNOSIS — R262 Difficulty in walking, not elsewhere classified: Secondary | ICD-10-CM

## 2022-04-21 DIAGNOSIS — M25662 Stiffness of left knee, not elsewhere classified: Secondary | ICD-10-CM

## 2022-04-21 NOTE — Therapy (Signed)
OUTPATIENT PHYSICAL THERAPY LOWER EXTREMITY TREATMENT   Patient Name: Veronica [name] MRN: [id number] DOB:11/20/1970, 52 y.o., female Today's Date: 04/21/2022  END OF SESSION:  PT End of Session - 04/21/22 1530     Visit Number 6    Number of Visits 17    Date for PT Re-Evaluation 05/15/22    Authorization Time Period 03/20/22 to 05/29/22    PT Start Time 1516    PT Stop Time 1556    PT Time Calculation (min) 40 min    Activity Tolerance Patient tolerated treatment well    Behavior During Therapy WFL for tasks assessed/performed              Past Medical History:  Diagnosis Date   Anxiety    Arthritis    Asthma    DM (diabetes mellitus) (HCC)    GERD (gastroesophageal reflux disease)    HTN (hypertension)    Obesity    Sleep apnea    Past Surgical History:  Procedure Laterality Date   CESAREAN SECTION     x 3   CHOLECYSTECTOMY     COLONOSCOPY WITH PROPOFOL N/A 03/02/2022   Procedure: COLONOSCOPY WITH PROPOFOL;  Surgeon: Tressia Danas, MD;  Location: [facility];  Service: Gastroenterology;  Laterality: N/A;   IR ABLATE LIVER CRYOABLATION  08/21/2021   IR ABLATE LIVER CRYOABLATION  08/21/2021   IR ABLATE LIVER CRYOABLATION  08/21/2021   IR ABLATE LIVER CRYOABLATION  09/17/2021   IR ABLATE LIVER CRYOABLATION  09/17/2021   IR ABLATE LIVER CRYOABLATION  09/17/2021   IR ABLATE LIVER CRYOABLATION  09/17/2021   IR RADIOLOGIST EVAL & MGMT  08/04/2021   IR RADIOLOGIST EVAL & MGMT  09/11/2021   IR RADIOLOGIST EVAL & MGMT  10/13/2021   LAPAROSCOPIC GASTRIC BANDING     SUPRACERVICAL ABDOMINAL HYSTERECTOMY  2017   Patient Active Problem List   Diagnosis Date Noted   Special screening for malignant neoplasms, colon 03/02/2022   Hyperlipidemia associated with type 2 diabetes mellitus (HCC) 09/29/2021   Vaginal septum 09/16/2021   Vitreomacular adhesion of both eyes 09/11/2021   Moderate persistent asthma with exacerbation 07/11/2021   HTN (hypertension), benign  07/11/2021   Type 2 diabetes mellitus with hyperglycemia, without long-term current use of insulin (HCC) 07/02/2021   Morbid obesity (HCC) 07/01/2021   Vaginal bleeding 05/12/2021   Pain in joint involving multiple sites 03/04/2021   History of colon polyps 03/04/2021   OSA (obstructive sleep apnea) 06/13/2020   GAD (generalized anxiety disorder) 04/10/2020   Memory difficulty 04/10/2020   Asthma 04/09/2020   S/P abdominal supracervical subtotal hysterectomy 04/09/2020   Insomnia 04/09/2020    PCP: Anne Ng NP   REFERRING PROVIDER: Marcene Corning, MD  REFERRING DIAG: M25.561 (ICD-10-CM) - Pain in right knee  THERAPY DIAG:  Difficulty walking  Muscle weakness (generalized)  Stiffness of left knee, not elsewhere classified  Stiffness of right knee, not elsewhere classified  Rationale for Evaluation and Treatment: Rehabilitation  ONSET DATE: 03/17/2022    SUBJECTIVE STATEMENT:  I'm nervous about land therapy, I was killing it in the water, I don't think I'm going to do that on land. I've been working my legs a lot so I feel like now I've got some tightness and resistance going on like stiffness. Had an issue on Saturday where my knee was pulling itself in opposite directions but the surgeon thought that it was from barometric pressure. My knee was stuck in a bent position, heat helped to keep  the muscle from spasming. Feels better now.   PERTINENT HISTORY:  PAIN:  Are you having pain? Yes: NPRS scale: 4-5/10 Pain location: B knees  Pain description: constant ache, doesn't leave  Aggravating factors: WB  Relieving factors: heat, stretches, medicines   PRECAUTIONS: None  WEIGHT BEARING RESTRICTIONS: No  FALLS:  Has patient fallen in last 6 months? No some close calls because of dogs   LIVING ENVIRONMENT: Lives with: lives with their spouse Lives in: House/apartment Stairs: no STE, has 2nd floor but rarely has to go upstairs  Has following equipment at  home: Single point cane  OCCUPATION: teacher   PLOF: Independent, Independent with basic ADLs, Independent with gait, and Independent with transfers  PATIENT GOALS: get ready for surgery, find a way to be able to exercise more to lose weight for surgery, general prehab   NEXT MD VISIT:   OBJECTIVE:   DIAGNOSTIC FINDINGS:   PATIENT SURVEYS:  FOTO will do 2nd session   03/25/22: 40% with goal of 55% COGNITION: Overall cognitive status: Within functional limits for tasks assessed     SENSATION: DNT, reports intermittent numbness   EDEMA:  Chronic edema B knees, R tends to be more swollen   MUSCLE LENGTH:    POSTURE:   PALPATION:   LOWER EXTREMITY ROM:  Active ROM Right eval Left eval Right / Left 04/16/22  Hip flexion     Hip extension     Hip abduction     Hip adduction     Hip internal rotation     Hip external rotation     Knee flexion 110* 106* 115 / 110  Knee extension 9* 12* 0 / -8  Ankle dorsiflexion     Ankle plantarflexion     Ankle inversion     Ankle eversion      (Blank rows = not tested)  LOWER EXTREMITY MMT:  MMT Right eval Left eval Right / Left 04/16/22  Hip flexion 3 3 4-  Hip extension     Hip abduction 3 3 4-  Hip adduction     Hip internal rotation     Hip external rotation     Knee flexion 3- 3-  3+  Knee extension 4- 4- 4+  Ankle dorsiflexion     Ankle plantarflexion     Ankle inversion     Ankle eversion      (Blank rows = not tested)  LOWER EXTREMITY SPECIAL TESTS:    FUNCTIONAL TESTS:    GAIT: Distance walked: in clinic distances  Assistive device utilized: Single point cane Level of assistance: Modified independence Comments: + trendelenburg, antalgic    TODAY'S TREATMENT:                                                                                                                              DATE:    04/21/22  TherEx  LAQs yellow TB x10 B with 10 second holds HS curls  yellow TB 2x5 B 3 second holds   Seated clamshells yellow TB x10 2 second holds  Seated marches yellow TB x10 B 2 second holds  Supine quad sets + low range SLR x5 B 0# SAQs 2# 1x5 B  Bridges x10  STS x5      PATIENT EDUCATION:  Education details: HEP, POC, exam findings  Person educated: Patient and Spouse Education method: Customer service manager Education comprehension: verbalized understanding and returned demonstration  HOME EXERCISE PROGRAM: Access Code: [email or web address] URL: [email or web address] Date: 03/20/2022 Prepared by: Deniece Ree  Exercises - Supine Bridge with Resistance Band  - 2 x daily - 7 x weekly - 1 sets - 10 reps - 1 hold - Clam with Resistance  - 2 x daily - 7 x weekly - 1 sets - 10 reps - 1 hold - Sitting Knee Extension with Resistance  - 2 x daily - 7 x weekly - 1 sets - 10 reps - 1 hold - Seated Hamstring Curl with Anchored Resistance  - 2 x daily - 7 x weekly - 1 sets - 10 reps - 1 hold  ASSESSMENT:  CLINICAL IMPRESSION:   Mikell arrives today doing OK, a bit nervous about transitioning from water therapy to land. Has had a hard time doing HEP due to severe DOMS afterwards, gave her yellow TB to use instead. Focused on introduction to land based exercises today primarily in open chain positions today, generally avoided closed chain activities today as this tends to aggravate knee pain. Will continue efforts.      Initial Assessment: Patient is a 52 y.o. F who was seen today for physical therapy evaluation and treatment for B knee pain. Exam is as typical and expected given history and current complaints. Will benefit from skilled PT services to address functional impairments, reduce pain, and assist in preparation for surgery.   OBJECTIVE IMPAIRMENTS: Abnormal gait, difficulty walking, decreased ROM, decreased strength, increased edema, impaired flexibility, obesity, and pain.   ACTIVITY LIMITATIONS: squatting, stairs, transfers, and locomotion  level  PARTICIPATION LIMITATIONS: community activity, occupation, yard work, and church  PERSONAL FACTORS: Age, Behavior pattern, and Fitness are also affecting patient's functional outcome.   REHAB POTENTIAL: Excellent  CLINICAL DECISION MAKING: Stable/uncomplicated  EVALUATION COMPLEXITY: Low   GOALS: Goals reviewed with patient? Yes  SHORT TERM GOALS: Target date: 04/17/2022   Will be compliant with appropriate progressive HEP  Baseline: Goal status: ongoing 04/16/22  2.  Pain to be no more than 7/10 at worst  Baseline:  Goal status: Met 04/16/22  3.  B knee extension AROM to be no more than -5 degrees and flexion AROM to be no less than 115 degrees  Baseline:  Goal status: Partially met (RLE not LLE)   LONG TERM GOALS: Target date: 05/15/2022    MMT to be at least 4/5 in all tested groups  Baseline:  Goal status: INITIAL  2.  Pain to be no more than 5/10 at worst  Baseline:  Goal status: INITIAL  3.  Will be compliant in appropriate progressive prehab gym program (land or water) on an independent basis to assist in prehab prior to surgery and in weight loss efforts  Baseline:  Goal status: INITIAL    PLAN:  PT FREQUENCY: 1-2x/week  PT DURATION: 8 weeks  PLANNED INTERVENTIONS: Therapeutic exercises, Therapeutic activity, Neuromuscular re-education, Balance training, Gait training, Patient/Family education, Self Care, Joint mobilization, Stair training, Orthotic/Fit training, DME instructions, Aquatic Therapy, Dry Needling, Electrical stimulation, Spinal mobilization, Cryotherapy, Moist  heat, Taping, Ultrasound, Ionotophoresis 4mg /ml Dexamethasone, Manual therapy, and Re-evaluation  PLAN FOR NEXT SESSION: strength and ROM, general prehab program, core strength     Deniece Ree PT DPT PN2  04/21/2022, 4:02 PM

## 2022-04-23 ENCOUNTER — Encounter (HOSPITAL_BASED_OUTPATIENT_CLINIC_OR_DEPARTMENT_OTHER): Payer: Self-pay | Admitting: Physical Therapy

## 2022-04-23 ENCOUNTER — Ambulatory Visit (HOSPITAL_BASED_OUTPATIENT_CLINIC_OR_DEPARTMENT_OTHER): Payer: BC Managed Care – PPO | Attending: Orthopaedic Surgery | Admitting: Physical Therapy

## 2022-04-23 DIAGNOSIS — M6281 Muscle weakness (generalized): Secondary | ICD-10-CM | POA: Diagnosis not present

## 2022-04-23 DIAGNOSIS — M25572 Pain in left ankle and joints of left foot: Secondary | ICD-10-CM | POA: Insufficient documentation

## 2022-04-23 DIAGNOSIS — R262 Difficulty in walking, not elsewhere classified: Secondary | ICD-10-CM | POA: Diagnosis not present

## 2022-04-23 DIAGNOSIS — M25571 Pain in right ankle and joints of right foot: Secondary | ICD-10-CM | POA: Insufficient documentation

## 2022-04-23 DIAGNOSIS — M25661 Stiffness of right knee, not elsewhere classified: Secondary | ICD-10-CM | POA: Insufficient documentation

## 2022-04-23 DIAGNOSIS — M25662 Stiffness of left knee, not elsewhere classified: Secondary | ICD-10-CM | POA: Diagnosis not present

## 2022-04-23 DIAGNOSIS — R600 Localized edema: Secondary | ICD-10-CM | POA: Insufficient documentation

## 2022-04-23 NOTE — Therapy (Signed)
OUTPATIENT PHYSICAL THERAPY LOWER EXTREMITY TREATMENT   Patient Name: Veronica [name] MRN: [id number] DOB:September 23, 1970, 52 y.o., female Today's Date: 04/23/2022  END OF SESSION:  PT End of Session - 04/23/22 1549     Visit Number 7    Number of Visits 17    Date for PT Re-Evaluation 05/15/22    Authorization Time Period 03/20/22 to 05/29/22    PT Start Time 1553    PT Stop Time 1626    PT Time Calculation (min) 33 min    Activity Tolerance Patient tolerated treatment well    Behavior During Therapy WFL for tasks assessed/performed              Past Medical History:  Diagnosis Date   Anxiety    Arthritis    Asthma    DM (diabetes mellitus) ([place])    GERD (gastroesophageal reflux disease)    HTN (hypertension)    Obesity    Sleep apnea    Past Surgical History:  Procedure Laterality Date   CESAREAN SECTION     x 3   CHOLECYSTECTOMY     COLONOSCOPY WITH PROPOFOL N/A 03/02/2022   Procedure: COLONOSCOPY WITH PROPOFOL;  Surgeon: Thornton Park, MD;  Location: [facility];  Service: Gastroenterology;  Laterality: N/A;   IR ABLATE LIVER CRYOABLATION  08/21/2021   IR ABLATE LIVER CRYOABLATION  08/21/2021   IR ABLATE LIVER CRYOABLATION  08/21/2021   IR ABLATE LIVER CRYOABLATION  09/17/2021   IR ABLATE LIVER CRYOABLATION  09/17/2021   IR ABLATE LIVER CRYOABLATION  09/17/2021   IR ABLATE LIVER CRYOABLATION  09/17/2021   IR RADIOLOGIST EVAL & MGMT  08/04/2021   IR RADIOLOGIST EVAL & MGMT  09/11/2021   IR RADIOLOGIST EVAL & MGMT  10/13/2021   LAPAROSCOPIC GASTRIC BANDING     SUPRACERVICAL ABDOMINAL HYSTERECTOMY  2017   Patient Active Problem List   Diagnosis Date Noted   Special screening for malignant neoplasms, colon 03/02/2022   Hyperlipidemia associated with type 2 diabetes mellitus ([place]) 09/29/2021   Vaginal septum 09/16/2021   Vitreomacular adhesion of both eyes 09/11/2021   Moderate persistent asthma with exacerbation 07/11/2021   HTN (hypertension), benign  07/11/2021   Type 2 diabetes mellitus with hyperglycemia, without long-term current use of insulin ([place]) 07/02/2021   Morbid obesity ([place]) 07/01/2021   Vaginal bleeding 05/12/2021   Pain in joint involving multiple sites 03/04/2021   History of colon polyps 03/04/2021   OSA (obstructive sleep apnea) 06/13/2020   GAD (generalized anxiety disorder) 04/10/2020   Memory difficulty 04/10/2020   Asthma 04/09/2020   S/P abdominal supracervical subtotal hysterectomy 04/09/2020   Insomnia 04/09/2020    PCP: Flossie Buffy NP   REFERRING PROVIDER: Melrose Nakayama, MD  REFERRING DIAG: M25.561 (ICD-10-CM) - Pain in right knee  THERAPY DIAG:  Difficulty walking  Muscle weakness (generalized)  Stiffness of left knee, not elsewhere classified  Stiffness of right knee, not elsewhere classified  Rationale for Evaluation and Treatment: Rehabilitation  ONSET DATE: 03/17/2022  SUBJECTIVE STATEMENT:  Pt states that the R knee muscle were a little achey and sore after last session but no increase in pain. She states the L knee is really bothering her like when dressing this morning.   PERTINENT HISTORY:  PAIN:  Are you having pain? Yes: NPRS scale: 3-4/10 Pain location: B knees  Pain description: constant ache, doesn't leave  Aggravating factors: WB  Relieving factors: heat, stretches, medicines   PRECAUTIONS: None  WEIGHT BEARING RESTRICTIONS: No  FALLS:  Has patient fallen in last 6 months? 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  PLOF: Independent, Independent with basic ADLs, Independent with gait, and Independent with transfers  PATIENT GOALS: get ready for surgery, find a way to be able to exercise more to lose weight for surgery, general prehab    NEXT MD VISIT:   OBJECTIVE:   DIAGNOSTIC FINDINGS:   PATIENT SURVEYS:  FOTO will do 2nd session   03/25/22: 40% with goal of 55% LOWER EXTREMITY ROM:  Active ROM Right eval Left eval Right / Left 04/16/22  Hip flexion     Hip extension     Hip abduction     Hip adduction     Hip internal rotation     Hip external rotation     Knee flexion 110* 106* 115 / 110  Knee extension 9* 12* 0 / -8  Ankle dorsiflexion     Ankle plantarflexion     Ankle inversion     Ankle eversion      (Blank rows = not tested)  LOWER EXTREMITY MMT:  MMT Right eval Left eval Right / Left 04/16/22  Hip flexion 3 3 4-  Hip extension     Hip abduction 3 3 4-  Hip adduction     Hip internal rotation     Hip external rotation     Knee flexion 3- 3-  3+  Knee extension 4- 4- 4+  Ankle dorsiflexion     Ankle plantarflexion     Ankle inversion     Ankle eversion      (Blank rows = not tested)  TODAY'S TREATMENT:                                                                                                                              DATE:   04/23/22  Nustep L3 5 min  LAQs 2lbs 2x10 SLR 10x each  Standing HS curls 2lbs 2x10 Bridges 2x10  STS from high table 2x5 Tandem balance 30s 3x at hallway wall Sidestepping 31ft x2 laps   04/21/22  TherEx  LAQs yellow TB x10 B with 10 second holds HS curls yellow TB 2x5 B 3 second holds  Seated clamshells yellow TB x10 2 second holds  Seated marches yellow TB x10 B 2 second holds  Supine quad sets + low range SLR x5 B 0# SAQs 2# 1x5 B  Bridges x10  STS x5      PATIENT EDUCATION:  Education details: HEP, POC, exam findings  Person educated: Patient and Spouse Education method: Customer service manager Education comprehension: verbalized understanding and returned demonstration  HOME EXERCISE PROGRAM: Access Code: [email or web address] URL: [email or web address] Date: 03/20/2022 Prepared by: Deniece Ree  Exercises -  Supine Bridge with Resistance Band  - 2 x daily - 7 x weekly - 1 sets - 10 reps - 1 hold - Clam with Resistance  - 2 x daily - 7 x weekly - 1 sets - 10 reps - 1 hold - Sitting Knee Extension with Resistance  - 2 x daily - 7 x weekly - 1 sets - 10 reps - 1 hold - Seated Hamstring Curl with Anchored Resistance  - 2 x daily - 7 x weekly - 1 sets - 10 reps - 1 hold  ASSESSMENT:  CLINICAL IMPRESSION:  Pt was able to progress volume of exercise during session but is limited by fatigue. Pt was able to continue with previous exercise with ability to increase resistance and volume of quad, glute, and HS loading without pain. Pt STS/CKC movement pain free with elevated surface height. Plan to continue with land base strength with aquatic in order to reduce pain and prepare pt for upcoming TKA. Pt would benefit from continued skilled therapy in order to reach goals and maximize functional bilat LE strength and ROM for prevention of further functional decline.    Initial Assessment: Patient is a 52 y.o. F who was seen today for physical therapy evaluation and treatment for B knee pain. Exam is as typical and expected given history and current complaints. Will benefit from skilled PT services to address functional impairments, reduce pain, and assist in preparation for surgery.   OBJECTIVE IMPAIRMENTS: Abnormal gait, difficulty walking, decreased ROM, decreased strength, increased edema, impaired flexibility, obesity, and pain.   ACTIVITY LIMITATIONS: squatting, stairs, transfers, and locomotion level  PARTICIPATION LIMITATIONS: community activity, occupation, yard work, and church  PERSONAL FACTORS: Age, Behavior pattern, and Fitness are also affecting patient's functional outcome.   REHAB POTENTIAL: Excellent  CLINICAL DECISION MAKING: Stable/uncomplicated  EVALUATION COMPLEXITY: Low   GOALS: Goals reviewed with patient? Yes  SHORT TERM GOALS: Target date: 04/17/2022   Will be compliant with  appropriate progressive HEP  Baseline: Goal status: ongoing 04/16/22  2.  Pain to be no more than 7/10 at worst  Baseline:  Goal status: Met 04/16/22  3.  B knee extension AROM to be no more than -5 degrees and flexion AROM to be no less than 115 degrees  Baseline:  Goal status: Partially met (RLE not LLE)   LONG TERM GOALS: Target date: 05/15/2022    MMT to be at least 4/5 in all tested groups  Baseline:  Goal status: INITIAL  2.  Pain to be no more than 5/10 at worst  Baseline:  Goal status: INITIAL  3.  Will be compliant in appropriate progressive prehab gym program (land or water) on an independent basis to assist in prehab prior to surgery and in weight loss efforts  Baseline:  Goal status: INITIAL    PLAN:  PT FREQUENCY: 1-2x/week  PT DURATION: 8 weeks  PLANNED INTERVENTIONS: Therapeutic exercises, Therapeutic activity, Neuromuscular re-education, Balance training, Gait training, Patient/Family education, Self Care, Joint mobilization, Stair training, Orthotic/Fit training, DME instructions, Aquatic Therapy, Dry Needling, Electrical stimulation, Spinal mobilization, Cryotherapy, Moist heat, Taping, Ultrasound, Ionotophoresis 4mg /ml Dexamethasone, Manual therapy, and Re-evaluation  PLAN FOR NEXT SESSION: strength and ROM, general prehab program, core strength    Daleen Bo PT, DPT 04/23/22 4:40 PM

## 2022-04-27 ENCOUNTER — Ambulatory Visit
Admission: RE | Admit: 2022-04-27 | Discharge: 2022-04-27 | Disposition: A | Discharge status: Home / Self Care | Payer: BC Managed Care – PPO | Source: Ambulatory Visit | Attending: Nurse Practitioner | Admitting: Nurse Practitioner

## 2022-04-27 ENCOUNTER — Ambulatory Visit: Payer: BC Managed Care – PPO

## 2022-04-27 DIAGNOSIS — N6489 Other specified disorders of breast: Secondary | ICD-10-CM

## 2022-04-29 ENCOUNTER — Ambulatory Visit (HOSPITAL_BASED_OUTPATIENT_CLINIC_OR_DEPARTMENT_OTHER): Payer: BC Managed Care – PPO | Admitting: Physical Therapy

## 2022-04-29 ENCOUNTER — Encounter (HOSPITAL_BASED_OUTPATIENT_CLINIC_OR_DEPARTMENT_OTHER): Payer: Self-pay | Admitting: Physical Therapy

## 2022-04-29 DIAGNOSIS — R262 Difficulty in walking, not elsewhere classified: Secondary | ICD-10-CM | POA: Diagnosis not present

## 2022-04-29 DIAGNOSIS — M25662 Stiffness of left knee, not elsewhere classified: Secondary | ICD-10-CM

## 2022-04-29 DIAGNOSIS — M25571 Pain in right ankle and joints of right foot: Secondary | ICD-10-CM | POA: Diagnosis not present

## 2022-04-29 DIAGNOSIS — M6281 Muscle weakness (generalized): Secondary | ICD-10-CM

## 2022-04-29 DIAGNOSIS — M25661 Stiffness of right knee, not elsewhere classified: Secondary | ICD-10-CM

## 2022-04-29 DIAGNOSIS — R600 Localized edema: Secondary | ICD-10-CM | POA: Diagnosis not present

## 2022-04-29 DIAGNOSIS — M25572 Pain in left ankle and joints of left foot: Secondary | ICD-10-CM | POA: Diagnosis not present

## 2022-04-29 NOTE — Therapy (Signed)
OUTPATIENT PHYSICAL THERAPY LOWER EXTREMITY TREATMENT   Patient Name: Veronica [name] MRN: [id number] DOB:Sep 12, 1970, 52 y.o., female Today's Date: 04/29/2022  END OF SESSION:  PT End of Session - 04/29/22 1600     Visit Number 8    Number of Visits 17    Date for PT Re-Evaluation 05/15/22    Authorization Time Period 03/20/22 to 05/29/22    PT Start Time 1601    PT Stop Time 1600    PT Time Calculation (min) 1439 min    Activity Tolerance Patient tolerated treatment well    Behavior During Therapy WFL for tasks assessed/performed               Past Medical History:  Diagnosis Date   Anxiety    Arthritis    Asthma    DM (diabetes mellitus) ([place])    GERD (gastroesophageal reflux disease)    HTN (hypertension)    Obesity    Sleep apnea    Past Surgical History:  Procedure Laterality Date   CESAREAN SECTION     x 3   CHOLECYSTECTOMY     COLONOSCOPY WITH PROPOFOL N/A 03/02/2022   Procedure: COLONOSCOPY WITH PROPOFOL;  Surgeon: Thornton Park, MD;  Location: [facility];  Service: Gastroenterology;  Laterality: N/A;   IR ABLATE LIVER CRYOABLATION  08/21/2021   IR ABLATE LIVER CRYOABLATION  08/21/2021   IR ABLATE LIVER CRYOABLATION  08/21/2021   IR ABLATE LIVER CRYOABLATION  09/17/2021   IR ABLATE LIVER CRYOABLATION  09/17/2021   IR ABLATE LIVER CRYOABLATION  09/17/2021   IR ABLATE LIVER CRYOABLATION  09/17/2021   IR RADIOLOGIST EVAL & MGMT  08/04/2021   IR RADIOLOGIST EVAL & MGMT  09/11/2021   IR RADIOLOGIST EVAL & MGMT  10/13/2021   LAPAROSCOPIC GASTRIC BANDING     SUPRACERVICAL ABDOMINAL HYSTERECTOMY  2017   Patient Active Problem List   Diagnosis Date Noted   Special screening for malignant neoplasms, colon 03/02/2022   Hyperlipidemia associated with type 2 diabetes mellitus ([place]) 09/29/2021   Vaginal septum 09/16/2021   Vitreomacular adhesion of both eyes 09/11/2021   Moderate persistent asthma with exacerbation 07/11/2021   HTN (hypertension), benign  07/11/2021   Type 2 diabetes mellitus with hyperglycemia, without long-term current use of insulin ([place]) 07/02/2021   Morbid obesity ([place]) 07/01/2021   Vaginal bleeding 05/12/2021   Pain in joint involving multiple sites 03/04/2021   History of colon polyps 03/04/2021   OSA (obstructive sleep apnea) 06/13/2020   GAD (generalized anxiety disorder) 04/10/2020   Memory difficulty 04/10/2020   Asthma 04/09/2020   S/P abdominal supracervical subtotal hysterectomy 04/09/2020   Insomnia 04/09/2020    PCP: Flossie Buffy NP   REFERRING PROVIDER: Melrose Nakayama, MD  REFERRING DIAG: M25.561 (ICD-10-CM) - Pain in right knee  THERAPY DIAG:  Difficulty walking  Muscle weakness (generalized)  Stiffness of left knee, not elsewhere classified  Stiffness of right knee, not elsewhere classified  Rationale for Evaluation and Treatment: Rehabilitation  ONSET DATE: 03/17/2022  SUBJECTIVE STATEMENT:  Pt reports she was very sore after last session for 2x days. Pt just started having pain and sharp shooting as she stood up from her chair in the lobby. Pt requests to try TPDN today.   PERTINENT HISTORY:  PAIN:  Are you having pain? Yes: NPRS scale: 7/10 Pain location: B knees  Pain description: constant ache, doesn't leave  Aggravating factors: WB  Relieving factors: heat, stretches, medicines   PRECAUTIONS: None  WEIGHT BEARING RESTRICTIONS: No  FALLS:  Has patient fallen in last 6 months? 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  PLOF: Independent, Independent with basic ADLs, Independent with gait, and Independent with transfers  PATIENT GOALS: get ready for surgery, find a way to be able to exercise more to lose weight for surgery, general prehab    NEXT MD VISIT:   OBJECTIVE:   DIAGNOSTIC FINDINGS:   PATIENT SURVEYS:  FOTO will do 2nd session   03/25/22: 40% with goal of 55% LOWER EXTREMITY ROM:  Active ROM Right eval Left eval Right / Left 04/16/22  Hip flexion     Hip extension     Hip abduction     Hip adduction     Hip internal rotation     Hip external rotation     Knee flexion 110* 106* 115 / 110  Knee extension 9* 12* 0 / -8  Ankle dorsiflexion     Ankle plantarflexion     Ankle inversion     Ankle eversion      (Blank rows = not tested)  LOWER EXTREMITY MMT:  MMT Right eval Left eval Right / Left 04/16/22  Hip flexion 3 3 4-  Hip extension     Hip abduction 3 3 4-  Hip adduction     Hip internal rotation     Hip external rotation     Knee flexion 3- 3-  3+  Knee extension 4- 4- 4+  Ankle dorsiflexion     Ankle plantarflexion     Ankle inversion     Ankle eversion      (Blank rows = not tested)  TODAY'S TREATMENT:                                                                                                                              DATE:   04/29/22 Trigger Point Dry-Needling  Treatment instructions: Expect mild to moderate muscle soreness. S/S of pneumothorax if dry needled over a lung field, and to seek immediate medical attention should they occur. Patient verbalized understanding of these instructions and education.   Patient Consent Given: Yes Education (verbally/handout)provided: Yes Muscles treated: R  and L VL, rec fem  Electrical stimulation performed: N/A Parameters:   N/A Treatment response/outcome: LTR elicited with very strong twitch into VL  S/L quad stretch with strap 30s 3x   STM throughout bilat quad spasm L>R       04/23/22  Nustep L3 5 min  LAQs 2lbs 2x10 SLR 10x each  Standing HS curls 2lbs 2x10 Bridges 2x10  STS from high table 2x5 Tandem balance 30s 3x at hallway wall Sidestepping 39ft x2 laps   04/21/22  TherEx  LAQs yellow TB x10 B with 10  second holds HS curls yellow TB 2x5 B 3 second holds  Seated clamshells yellow TB x10 2 second holds  Seated marches yellow TB x10 B 2 second holds  Supine quad sets + low range SLR x5 B 0# SAQs 2# 1x5 B  Bridges x10  STS x5      PATIENT EDUCATION:  Education details: HEP, POC, exam findings  Person educated: Patient and Spouse Education method: Medical illustrator Education comprehension: verbalized understanding and returned demonstration  HOME EXERCISE PROGRAM: Access Code: [email or web address] URL: [email or web address] Date: 03/20/2022 Prepared by: Nedra Hai  Exercises - Supine Bridge with Resistance Band  - 2 x daily - 7 x weekly - 1 sets - 10 reps - 1 hold - Clam with Resistance  - 2 x daily - 7 x weekly - 1 sets - 10 reps - 1 hold - Sitting Knee Extension with Resistance  - 2 x daily - 7 x weekly - 1 sets - 10 reps - 1 hold - Seated Hamstring Curl with Anchored Resistance  - 2 x daily - 7 x weekly - 1 sets - 10 reps - 1 hold  ASSESSMENT:  CLINICAL IMPRESSION: Pt with highly irritable and sensitive pain just prior to session starting as she was walking from lobby to clinic. Pt felt a painful spasm and demo'd antalgic gait pattern and difficulty with WB due to quad spasm. Several large twitch responses elicited bilaterally with TPDN and STM. Pt unable to perform strengthening at today's session due to increase in spasm with attempted loading. Plan to continue with land base strength with aquatic in order to reduce pain and prepare pt for upcoming TKA. Consider moving more appts to aquatic if pain continues to affect land based exercise. Pt would benefit from continued skilled therapy in order to reach goals and maximize functional bilat LE strength and ROM for prevention of further functional decline.    Initial Assessment: Patient is a 52 y.o. F who was seen today for physical therapy evaluation and treatment for B knee pain. Exam is as typical and expected  given history and current complaints. Will benefit from skilled PT services to address functional impairments, reduce pain, and assist in preparation for surgery.   OBJECTIVE IMPAIRMENTS: Abnormal gait, difficulty walking, decreased ROM, decreased strength, increased edema, impaired flexibility, obesity, and pain.   ACTIVITY LIMITATIONS: squatting, stairs, transfers, and locomotion level  PARTICIPATION LIMITATIONS: community activity, occupation, yard work, and church  PERSONAL FACTORS: Age, Behavior pattern, and Fitness are also affecting patient's functional outcome.   REHAB POTENTIAL: Excellent  CLINICAL DECISION MAKING: Stable/uncomplicated  EVALUATION COMPLEXITY: Low   GOALS: Goals reviewed with patient? Yes  SHORT TERM GOALS: Target date: 04/17/2022   Will be compliant with appropriate progressive HEP  Baseline: Goal status: ongoing 04/16/22  2.  Pain to be no more than 7/10 at worst  Baseline:  Goal status: Met 04/16/22  3.  B knee extension AROM to be no more than -5 degrees and flexion AROM to be no less than 115 degrees  Baseline:  Goal status: Partially met (RLE not LLE)   LONG TERM GOALS: Target date: 05/15/2022    MMT to be at least 4/5 in all tested groups  Baseline:  Goal status: INITIAL  2.  Pain to be no more than 5/10 at worst  Baseline:  Goal status: INITIAL  3.  Will be compliant in appropriate progressive prehab gym program (land or water) on an independent basis to assist in prehab prior to surgery  and in weight loss efforts  Baseline:  Goal status: INITIAL    PLAN:  PT FREQUENCY: 1-2x/week  PT DURATION: 8 weeks  PLANNED INTERVENTIONS: Therapeutic exercises, Therapeutic activity, Neuromuscular re-education, Balance training, Gait training, Patient/Family education, Self Care, Joint mobilization, Stair training, Orthotic/Fit training, DME instructions, Aquatic Therapy, Dry Needling, Electrical stimulation, Spinal mobilization, Cryotherapy,  Moist heat, Taping, Ultrasound, Ionotophoresis 4mg /ml Dexamethasone, Manual therapy, and Re-evaluation  PLAN FOR NEXT SESSION: strength and ROM, general prehab program, core strength    Daleen Bo PT, DPT 04/29/22 4:47 PM

## 2022-05-01 ENCOUNTER — Ambulatory Visit (HOSPITAL_BASED_OUTPATIENT_CLINIC_OR_DEPARTMENT_OTHER): Payer: BC Managed Care – PPO | Admitting: Physical Therapy

## 2022-05-01 DIAGNOSIS — M6281 Muscle weakness (generalized): Secondary | ICD-10-CM | POA: Diagnosis not present

## 2022-05-01 DIAGNOSIS — M25662 Stiffness of left knee, not elsewhere classified: Secondary | ICD-10-CM

## 2022-05-01 DIAGNOSIS — R262 Difficulty in walking, not elsewhere classified: Secondary | ICD-10-CM | POA: Diagnosis not present

## 2022-05-01 DIAGNOSIS — M25661 Stiffness of right knee, not elsewhere classified: Secondary | ICD-10-CM | POA: Diagnosis not present

## 2022-05-01 DIAGNOSIS — M25572 Pain in left ankle and joints of left foot: Secondary | ICD-10-CM

## 2022-05-01 DIAGNOSIS — M25571 Pain in right ankle and joints of right foot: Secondary | ICD-10-CM | POA: Diagnosis not present

## 2022-05-01 DIAGNOSIS — R600 Localized edema: Secondary | ICD-10-CM

## 2022-05-01 NOTE — Therapy (Signed)
OUTPATIENT PHYSICAL THERAPY LOWER EXTREMITY TREATMENT   Patient Name: Veronica [name] MRN: [id number] DOB:February 24, 1971, 52 y.o., female Today's Date: 04/29/2022  END OF SESSION:  PT End of Session - 04/29/22 1600     Visit Number 8    Number of Visits 17    Date for PT Re-Evaluation 05/15/22    Authorization Time Period 03/20/22 to 05/29/22    PT Start Time 1601    PT Stop Time 1600    PT Time Calculation (min) 1439 min    Activity Tolerance Patient tolerated treatment well    Behavior During Therapy WFL for tasks assessed/performed               Past Medical History:  Diagnosis Date   Anxiety    Arthritis    Asthma    DM (diabetes mellitus) ([place])    GERD (gastroesophageal reflux disease)    HTN (hypertension)    Obesity    Sleep apnea    Past Surgical History:  Procedure Laterality Date   CESAREAN SECTION     x 3   CHOLECYSTECTOMY     COLONOSCOPY WITH PROPOFOL N/A 03/02/2022   Procedure: COLONOSCOPY WITH PROPOFOL;  Surgeon: Thornton Park, MD;  Location: [facility];  Service: Gastroenterology;  Laterality: N/A;   IR ABLATE LIVER CRYOABLATION  08/21/2021   IR ABLATE LIVER CRYOABLATION  08/21/2021   IR ABLATE LIVER CRYOABLATION  08/21/2021   IR ABLATE LIVER CRYOABLATION  09/17/2021   IR ABLATE LIVER CRYOABLATION  09/17/2021   IR ABLATE LIVER CRYOABLATION  09/17/2021   IR ABLATE LIVER CRYOABLATION  09/17/2021   IR RADIOLOGIST EVAL & MGMT  08/04/2021   IR RADIOLOGIST EVAL & MGMT  09/11/2021   IR RADIOLOGIST EVAL & MGMT  10/13/2021   LAPAROSCOPIC GASTRIC BANDING     SUPRACERVICAL ABDOMINAL HYSTERECTOMY  2017   Patient Active Problem List   Diagnosis Date Noted   Special screening for malignant neoplasms, colon 03/02/2022   Hyperlipidemia associated with type 2 diabetes mellitus ([place]) 09/29/2021   Vaginal septum 09/16/2021   Vitreomacular adhesion of both eyes 09/11/2021   Moderate persistent asthma with exacerbation 07/11/2021   HTN (hypertension), benign  07/11/2021   Type 2 diabetes mellitus with hyperglycemia, without long-term current use of insulin ([place]) 07/02/2021   Morbid obesity ([place]) 07/01/2021   Vaginal bleeding 05/12/2021   Pain in joint involving multiple sites 03/04/2021   History of colon polyps 03/04/2021   OSA (obstructive sleep apnea) 06/13/2020   GAD (generalized anxiety disorder) 04/10/2020   Memory difficulty 04/10/2020   Asthma 04/09/2020   S/P abdominal supracervical subtotal hysterectomy 04/09/2020   Insomnia 04/09/2020    PCP: Flossie Buffy NP   REFERRING PROVIDER: Melrose Nakayama, MD  REFERRING DIAG: M25.561 (ICD-10-CM) - Pain in right knee  THERAPY DIAG:  Difficulty walking  Muscle weakness (generalized)  Stiffness of left knee, not elsewhere classified  Stiffness of right knee, not elsewhere classified  Rationale for Evaluation and Treatment: Rehabilitation  ONSET DATE: 03/17/2022  SUBJECTIVE STATEMENT:  The patient reports she I s better then last time. She is stil sore today.   PERTINENT HISTORY:  PAIN:  Are you having pain? Yes: NPRS scale: 5/10 Pain location: B knees  Pain description: constant ache, doesn't leave  Aggravating factors: WB  Relieving factors: heat, stretches, medicines   PRECAUTIONS: None  WEIGHT BEARING RESTRICTIONS: No  FALLS:  Has patient fallen in last 6 months? 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  PLOF: Independent, Independent with basic ADLs, Independent with gait, and Independent with transfers  PATIENT GOALS: get ready for surgery, find a way to be able to exercise more to lose weight for surgery, general prehab   NEXT MD VISIT:   OBJECTIVE:   DIAGNOSTIC FINDINGS:   PATIENT SURVEYS:  FOTO will do 2nd session    03/25/22: 40% with goal of 55% LOWER EXTREMITY ROM:  Active ROM Right eval Left eval Right / Left 04/16/22  Hip flexion     Hip extension     Hip abduction     Hip adduction     Hip internal rotation     Hip external rotation     Knee flexion 110* 106* 115 / 110  Knee extension 9* 12* 0 / -8  Ankle dorsiflexion     Ankle plantarflexion     Ankle inversion     Ankle eversion      (Blank rows = not tested)  LOWER EXTREMITY MMT:  MMT Right eval Left eval Right / Left 04/16/22  Hip flexion 3 3 4-  Hip extension     Hip abduction 3 3 4-  Hip adduction     Hip internal rotation     Hip external rotation     Knee flexion 3- 3-  3+  Knee extension 4- 4- 4+  Ankle dorsiflexion     Ankle plantarflexion     Ankle inversion     Ankle eversion      (Blank rows = not tested)  TODAY'S TREATMENT:                                                                                                                              DATE:  2/9 Nu-step 5 min  Quad set 2x15 SAQ 2x10 SLR: 2x10   Seated clamshell 2x15 red   Standing heel raise 2x15   Reviewed and updated HEP     04/29/22 Trigger Point Dry-Needling  Treatment instructions: Expect mild to moderate muscle soreness. S/S of pneumothorax if dry needled over a lung field, and to seek immediate medical attention should they occur. Patient verbalized understanding of these instructions and education.   Patient Consent Given: Yes Education (verbally/handout)provided: Yes Muscles treated: R  and L VL, rec fem  Electrical stimulation performed: N/A Parameters:   N/A Treatment response/outcome: LTR elicited with very strong twitch into VL  S/L quad stretch with strap 30s 3x   STM throughout bilat quad spasm L>R       04/23/22  Nustep L3 5 min  LAQs 2lbs 2x10 SLR 10x each  Standing HS curls 2lbs 2x10 Bridges 2x10  STS from high table 2x5 Tandem balance 30s 3x at hallway wall Sidestepping 10f x2  laps  04/21/22  TherEx  LAQs yellow TB x10 B with 10 second holds HS curls yellow TB 2x5 B 3 second holds  Seated clamshells yellow TB x10 2 second holds  Seated marches yellow TB x10 B 2 second holds  Supine quad sets + low range SLR x5 B 0# SAQs 2# 1x5 B  Bridges x10  STS x5      PATIENT EDUCATION:  Education details: HEP, POC, exam findings  Person educated: Patient and Spouse Education method: Customer service manager Education comprehension: verbalized understanding and returned demonstration  HOME EXERCISE PROGRAM: Access Code: [email or web address] URL: [email or web address] Date: 03/20/2022 Prepared by: Deniece Ree  Exercises - Supine Bridge with Resistance Band  - 2 x daily - 7 x weekly - 1 sets - 10 reps - 1 hold - Clam with Resistance  - 2 x daily - 7 x weekly - 1 sets - 10 reps - 1 hold - Sitting Knee Extension with Resistance  - 2 x daily - 7 x weekly - 1 sets - 10 reps - 1 hold - Seated Hamstring Curl with Anchored Resistance  - 2 x daily - 7 x weekly - 1 sets - 10 reps - 1 hold  ASSESSMENT:  CLINICAL IMPRESSION: The patient did better today.  We were able to perform there-ex without a significant increase in pain. We reviewed base exercises today. We reviewed the exercises she can continue to do at home. She is considering joining the gym. We reviewed her HEP. She was advised to keep it simple. We will do 4-5 exercises right now.   Initial Assessment: Patient is a 52 y.o. F who was seen today for physical therapy evaluation and treatment for B knee pain. Exam is as typical and expected given history and current complaints. Will benefit from skilled PT services to address functional impairments, reduce pain, and assist in preparation for surgery.   OBJECTIVE IMPAIRMENTS: Abnormal gait, difficulty walking, decreased ROM, decreased strength, increased edema, impaired flexibility, obesity, and pain.   ACTIVITY LIMITATIONS: squatting, stairs,  transfers, and locomotion level  PARTICIPATION LIMITATIONS: community activity, occupation, yard work, and church  PERSONAL FACTORS: Age, Behavior pattern, and Fitness are also affecting patient's functional outcome.   REHAB POTENTIAL: Excellent  CLINICAL DECISION MAKING: Stable/uncomplicated  EVALUATION COMPLEXITY: Low   GOALS: Goals reviewed with patient? Yes  SHORT TERM GOALS: Target date: 04/17/2022   Will be compliant with appropriate progressive HEP  Baseline: Goal status: ongoing 04/16/22  2.  Pain to be no more than 7/10 at worst  Baseline:  Goal status: Met 04/16/22  3.  B knee extension AROM to be no more than -5 degrees and flexion AROM to be no less than 115 degrees  Baseline:  Goal status: Partially met (RLE not LLE)   LONG TERM GOALS: Target date: 05/15/2022    MMT to be at least 4/5 in all tested groups  Baseline:  Goal status: INITIAL  2.  Pain to be no more than 5/10 at worst  Baseline:  Goal status: INITIAL  3.  Will be compliant in appropriate progressive prehab gym program (land or water) on an independent basis to assist in prehab prior to surgery and in weight loss efforts  Baseline:  Goal status: INITIAL    PLAN:  PT FREQUENCY: 1-2x/week  PT DURATION: 8 weeks  PLANNED INTERVENTIONS: Therapeutic exercises, Therapeutic activity, Neuromuscular re-education, Balance training, Gait training, Patient/Family education, Self Care, Joint mobilization, Stair training, Orthotic/Fit training, DME instructions, Aquatic Therapy, Dry Needling, Electrical stimulation, Spinal  mobilization, Cryotherapy, Moist heat, Taping, Ultrasound, Ionotophoresis 52m/ml Dexamethasone, Manual therapy, and Re-evaluation  PLAN FOR NEXT SESSION: strength and ROM, general prehab program, core strength    DCarolyne LittlesPT DPT  04/29/22 4:47 PM

## 2022-05-04 ENCOUNTER — Encounter (HOSPITAL_BASED_OUTPATIENT_CLINIC_OR_DEPARTMENT_OTHER): Payer: Self-pay | Admitting: Physical Therapy

## 2022-05-04 ENCOUNTER — Ambulatory Visit (HOSPITAL_BASED_OUTPATIENT_CLINIC_OR_DEPARTMENT_OTHER): Payer: BC Managed Care – PPO | Admitting: Physical Therapy

## 2022-05-04 DIAGNOSIS — M6281 Muscle weakness (generalized): Secondary | ICD-10-CM

## 2022-05-04 DIAGNOSIS — R600 Localized edema: Secondary | ICD-10-CM | POA: Diagnosis not present

## 2022-05-04 DIAGNOSIS — R262 Difficulty in walking, not elsewhere classified: Secondary | ICD-10-CM

## 2022-05-04 DIAGNOSIS — M25662 Stiffness of left knee, not elsewhere classified: Secondary | ICD-10-CM

## 2022-05-04 DIAGNOSIS — M25571 Pain in right ankle and joints of right foot: Secondary | ICD-10-CM | POA: Diagnosis not present

## 2022-05-04 DIAGNOSIS — M25661 Stiffness of right knee, not elsewhere classified: Secondary | ICD-10-CM | POA: Diagnosis not present

## 2022-05-04 DIAGNOSIS — M25572 Pain in left ankle and joints of left foot: Secondary | ICD-10-CM | POA: Diagnosis not present

## 2022-05-04 NOTE — Therapy (Signed)
OUTPATIENT PHYSICAL THERAPY LOWER EXTREMITY TREATMENT   Patient Name: Veronica [name] MRN: [id number] DOB:[birth year], 52 y.o., female Today's Date: 05/04/2022  END OF SESSION:  PT End of Session - 05/04/22 1607     Visit Number 10    Number of Visits 17    Date for PT Re-Evaluation 05/29/22    Authorization Time Period 03/20/22 to 05/29/22    PT Start Time 1600    PT Stop Time 1643    PT Time Calculation (min) 43 min    Activity Tolerance Patient tolerated treatment well;Patient limited by pain    Behavior During Therapy WFL for tasks assessed/performed                Past Medical History:  Diagnosis Date   Anxiety    Arthritis    Asthma    DM (diabetes mellitus) ([place])    GERD (gastroesophageal reflux disease)    HTN (hypertension)    Obesity    Sleep apnea    Past Surgical History:  Procedure Laterality Date   CESAREAN SECTION     x 3   CHOLECYSTECTOMY     COLONOSCOPY WITH PROPOFOL N/A 03/02/2022   Procedure: COLONOSCOPY WITH PROPOFOL;  Surgeon: Thornton Park, MD;  Location: [facility];  Service: Gastroenterology;  Laterality: N/A;   IR ABLATE LIVER CRYOABLATION  08/21/2021   IR ABLATE LIVER CRYOABLATION  08/21/2021   IR ABLATE LIVER CRYOABLATION  08/21/2021   IR ABLATE LIVER CRYOABLATION  09/17/2021   IR ABLATE LIVER CRYOABLATION  09/17/2021   IR ABLATE LIVER CRYOABLATION  09/17/2021   IR ABLATE LIVER CRYOABLATION  09/17/2021   IR RADIOLOGIST EVAL & MGMT  08/04/2021   IR RADIOLOGIST EVAL & MGMT  09/11/2021   IR RADIOLOGIST EVAL & MGMT  10/13/2021   LAPAROSCOPIC GASTRIC BANDING     SUPRACERVICAL ABDOMINAL HYSTERECTOMY  2017   Patient Active Problem List   Diagnosis Date Noted   Special screening for malignant neoplasms, colon 03/02/2022   Hyperlipidemia associated with type 2 diabetes mellitus ([place]) 09/29/2021   Vaginal septum 09/16/2021   Vitreomacular adhesion of both eyes 09/11/2021   Moderate persistent asthma with exacerbation 07/11/2021    HTN (hypertension), benign 07/11/2021   Type 2 diabetes mellitus with hyperglycemia, without long-term current use of insulin ([place]) 07/02/2021   Morbid obesity ([place]) 07/01/2021   Vaginal bleeding 05/12/2021   Pain in joint involving multiple sites 03/04/2021   History of colon polyps 03/04/2021   OSA (obstructive sleep apnea) 06/13/2020   GAD (generalized anxiety disorder) 04/10/2020   Memory difficulty 04/10/2020   Asthma 04/09/2020   S/P abdominal supracervical subtotal hysterectomy 04/09/2020   Insomnia 04/09/2020    PCP: Flossie Buffy NP   REFERRING PROVIDER: Melrose Nakayama, MD  REFERRING DIAG: M25.561 (ICD-10-CM) - Pain in right knee  THERAPY DIAG:  Difficulty walking  Muscle weakness (generalized)  Stiffness of left knee, not elsewhere classified  Stiffness of right knee, not elsewhere classified  Rationale for Evaluation and Treatment: Rehabilitation  ONSET DATE: 03/17/2022  SUBJECTIVE STATEMENT:  Pt states hs doing a little bit better today but she continues to have quad spasm. They were particularly bad this time.   PERTINENT HISTORY:  PAIN:  Are you having pain? Yes: NPRS scale: 5/10 Pain location: B knees  Pain description: constant ache, doesn't leave  Aggravating factors: WB  Relieving factors: heat, stretches, medicines   PRECAUTIONS: None  WEIGHT BEARING RESTRICTIONS: No  FALLS:  Has patient fallen in last 6 months?  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  PLOF: Independent, Independent with basic ADLs, Independent with gait, and Independent with transfers  PATIENT GOALS: get ready for surgery, find a way to be able to exercise more to lose weight for surgery, general prehab   NEXT MD VISIT:   OBJECTIVE:    DIAGNOSTIC FINDINGS:   PATIENT SURVEYS:  FOTO will do 2nd session   03/25/22: 40% with goal of 55%  05/04/22: 29%   LOWER EXTREMITY ROM:  Active ROM Right eval Left eval Right / Left 04/16/22 Right / Left 2/12  Hip flexion      Hip extension      Hip abduction      Hip adduction      Hip internal rotation      Hip external rotation      Knee flexion 110* 106* 115 / 110 119/116  Knee extension 9* 12* 0 / -8 0/-5  Ankle dorsiflexion      Ankle plantarflexion      Ankle inversion      Ankle eversion       (Blank rows = not tested)  LOWER EXTREMITY MMT:  MMT Right eval Left eval Right / Left 04/16/22 Right / Left 2/12  Hip flexion 3 3 4- 4/4  Hip extension      Hip abduction 3 3 4- 4+/4+  Hip adduction      Hip internal rotation      Hip external rotation      Knee flexion 3- 3-  3+ 4+/4+  Knee extension 4- 4- 4+ 4+/4+  Ankle dorsiflexion      Ankle plantarflexion      Ankle inversion      Ankle eversion       (Blank rows = not tested)  GAIT: Distance walked:60f Assistive device utilized: Single point cane Level of assistance: Modified independence Comments:antalgic   TODAY'S TREATMENT:                                                                                                                              DATE:   2/12 Nu-step Lvl 3; 5 min   LAQ 5s hold 2x10 5lbs each Shuttle leg press machine 75lbs DL 3x10 Prone HS curls 5lbs 2x10 each  Exam findings, walking program, knee alignment and frictional abrasion on the medial compartment    2/9 Nu-step 5 min  Quad set 2x15 SAQ 2x10 SLR: 2x10   Seated clamshell 2x15 red   Standing heel raise 2x15   Reviewed and updated HEP     04/29/22 Trigger Point Dry-Needling  Treatment instructions: Expect mild to moderate muscle soreness. S/S of pneumothorax if dry needled over a lung field, and to seek immediate medical  attention should they occur. Patient verbalized understanding of these instructions  and education.   Patient Consent Given: Yes Education (verbally/handout)provided: Yes Muscles treated: R  and L VL, rec fem  Electrical stimulation performed: N/A Parameters:   N/A Treatment response/outcome: LTR elicited with very strong twitch into VL  S/L quad stretch with strap 30s 3x   STM throughout bilat quad spasm L>R       04/23/22  Nustep L3 5 min  LAQs 2lbs 2x10 SLR 10x each  Standing HS curls 2lbs 2x10 Bridges 2x10  STS from high table 2x5 Tandem balance 30s 3x at hallway wall Sidestepping 37f x2 laps   04/21/22  TherEx  LAQs yellow TB x10 B with 10 second holds HS curls yellow TB 2x5 B 3 second holds  Seated clamshells yellow TB x10 2 second holds  Seated marches yellow TB x10 B 2 second holds  Supine quad sets + low range SLR x5 B 0# SAQs 2# 1x5 B  Bridges x10  STS x5      PATIENT EDUCATION:  Education details: HEP, POC, exam findings  Person educated: Patient and Spouse Education method: ECustomer service managerEducation comprehension: verbalized understanding and returned demonstration  HOME EXERCISE PROGRAM: Access Code: [email or web address]: [email or web address] Date: 03/20/2022 Prepared by: KDeniece Ree   ASSESSMENT:  CLINICAL IMPRESSION: Pt has made objective improvements in ROM as well as strength with greatest change occurring with functional mobility and gait as compared to previous assessment. Pt's FOTO score decreased likely due to pt report of questions regarding heavy squatting and lifting tasks. Pt to continue with current POC and re-cert if needed at the end. Plan to continue with functional mobility and strength as tolerated. Pt exercises progressed today. No changes to HEP. Pt would benefit from continued skilled therapy in order to reach goals and maximize functional bilat LE strength and ROM for prevention of further functional decline.  Initial Assessment: Patient is a 52y.o. F who was seen today for  physical therapy evaluation and treatment for B knee pain. Exam is as typical and expected given history and current complaints. Will benefit from skilled PT services to address functional impairments, reduce pain, and assist in preparation for surgery.   OBJECTIVE IMPAIRMENTS: Abnormal gait, difficulty walking, decreased ROM, decreased strength, increased edema, impaired flexibility, obesity, and pain.   ACTIVITY LIMITATIONS: squatting, stairs, transfers, and locomotion level  PARTICIPATION LIMITATIONS: community activity, occupation, yard work, and church  PERSONAL FACTORS: Age, Behavior pattern, and Fitness are also affecting patient's functional outcome.   REHAB POTENTIAL: Excellent  CLINICAL DECISION MAKING: Stable/uncomplicated  EVALUATION COMPLEXITY: Low   GOALS: Goals reviewed with patient? Yes  SHORT TERM GOALS: Target date: 04/17/2022   Will be compliant with appropriate progressive HEP  Baseline: Goal status: ongoing 04/16/22  2.  Pain to be no more than 7/10 at worst  Baseline:  Goal status: Met 04/16/22  3.  B knee extension AROM to be no more than -5 degrees and flexion AROM to be no less than 115 degrees  Baseline:  Goal status: Partially met (RLE not LLE)   LONG TERM GOALS: Target date: 05/15/2022    MMT to be at least 4/5 in all tested groups  Baseline:  Goal status: INITIAL  2.  Pain to be no more than 5/10 at worst  Baseline:  Goal status: INITIAL  3.  Will be compliant in appropriate progressive prehab gym program (land or water) on an independent basis to assist in prehab prior to  surgery and in weight loss efforts  Baseline:  Goal status: INITIAL    PLAN:  PT FREQUENCY: 1-2x/week  PT DURATION: 8 weeks  PLANNED INTERVENTIONS: Therapeutic exercises, Therapeutic activity, Neuromuscular re-education, Balance training, Gait training, Patient/Family education, Self Care, Joint mobilization, Stair training, Orthotic/Fit training, DME instructions,  Aquatic Therapy, Dry Needling, Electrical stimulation, Spinal mobilization, Cryotherapy, Moist heat, Taping, Ultrasound, Ionotophoresis 2m/ml Dexamethasone, Manual therapy, and Re-evaluation  PLAN FOR NEXT SESSION: strength and ROM, general prehab program, core strength   ADaleen BoPT, DPT 05/04/22 4:50 PM

## 2022-05-07 ENCOUNTER — Encounter (HOSPITAL_BASED_OUTPATIENT_CLINIC_OR_DEPARTMENT_OTHER): Payer: Self-pay | Admitting: Physical Therapy

## 2022-05-07 ENCOUNTER — Ambulatory Visit (HOSPITAL_BASED_OUTPATIENT_CLINIC_OR_DEPARTMENT_OTHER): Payer: BC Managed Care – PPO | Admitting: Physical Therapy

## 2022-05-07 DIAGNOSIS — M6281 Muscle weakness (generalized): Secondary | ICD-10-CM

## 2022-05-07 DIAGNOSIS — M25572 Pain in left ankle and joints of left foot: Secondary | ICD-10-CM | POA: Diagnosis not present

## 2022-05-07 DIAGNOSIS — M25662 Stiffness of left knee, not elsewhere classified: Secondary | ICD-10-CM | POA: Diagnosis not present

## 2022-05-07 DIAGNOSIS — M25661 Stiffness of right knee, not elsewhere classified: Secondary | ICD-10-CM

## 2022-05-07 DIAGNOSIS — R262 Difficulty in walking, not elsewhere classified: Secondary | ICD-10-CM | POA: Diagnosis not present

## 2022-05-07 DIAGNOSIS — M25571 Pain in right ankle and joints of right foot: Secondary | ICD-10-CM | POA: Diagnosis not present

## 2022-05-07 DIAGNOSIS — R600 Localized edema: Secondary | ICD-10-CM | POA: Diagnosis not present

## 2022-05-07 NOTE — Therapy (Signed)
OUTPATIENT PHYSICAL THERAPY LOWER EXTREMITY TREATMENT   Patient Name: CLETA ANTKOWIAK MRN: [id number] DOB:04/19/1970, 52 y.o., female Today's Date: 05/07/2022  END OF SESSION:  PT End of Session - 05/07/22 1450     Visit Number 11    Number of Visits 17    Date for PT Re-Evaluation 05/29/22    Authorization Time Period 03/20/22 to 05/29/22    PT Start Time 1449    PT Stop Time 1528    PT Time Calculation (min) 39 min    Behavior During Therapy WFL for tasks assessed/performed                Past Medical History:  Diagnosis Date   Anxiety    Arthritis    Asthma    DM (diabetes mellitus) ([place])    GERD (gastroesophageal reflux disease)    HTN (hypertension)    Obesity    Sleep apnea    Past Surgical History:  Procedure Laterality Date   CESAREAN SECTION     x 3   CHOLECYSTECTOMY     COLONOSCOPY WITH PROPOFOL N/A 03/02/2022   Procedure: COLONOSCOPY WITH PROPOFOL;  Surgeon: Thornton Park, MD;  Location: [facility];  Service: Gastroenterology;  Laterality: N/A;   IR ABLATE LIVER CRYOABLATION  08/21/2021   IR ABLATE LIVER CRYOABLATION  08/21/2021   IR ABLATE LIVER CRYOABLATION  08/21/2021   IR ABLATE LIVER CRYOABLATION  09/17/2021   IR ABLATE LIVER CRYOABLATION  09/17/2021   IR ABLATE LIVER CRYOABLATION  09/17/2021   IR ABLATE LIVER CRYOABLATION  09/17/2021   IR RADIOLOGIST EVAL & MGMT  08/04/2021   IR RADIOLOGIST EVAL & MGMT  09/11/2021   IR RADIOLOGIST EVAL & MGMT  10/13/2021   LAPAROSCOPIC GASTRIC BANDING     SUPRACERVICAL ABDOMINAL HYSTERECTOMY  2017   Patient Active Problem List   Diagnosis Date Noted   Special screening for malignant neoplasms, colon 03/02/2022   Hyperlipidemia associated with type 2 diabetes mellitus ([place]) 09/29/2021   Vaginal septum 09/16/2021   Vitreomacular adhesion of both eyes 09/11/2021   Moderate persistent asthma with exacerbation 07/11/2021   HTN (hypertension), benign 07/11/2021   Type 2 diabetes mellitus with  hyperglycemia, without long-term current use of insulin ([place]) 07/02/2021   Morbid obesity ([place]) 07/01/2021   Vaginal bleeding 05/12/2021   Pain in joint involving multiple sites 03/04/2021   History of colon polyps 03/04/2021   OSA (obstructive sleep apnea) 06/13/2020   GAD (generalized anxiety disorder) 04/10/2020   Memory difficulty 04/10/2020   Asthma 04/09/2020   S/P abdominal supracervical subtotal hysterectomy 04/09/2020   Insomnia 04/09/2020    PCP: Flossie Buffy NP   REFERRING PROVIDER: Melrose Nakayama, MD  REFERRING DIAG: M25.561 (ICD-10-CM) - Pain in right knee  THERAPY DIAG:  Difficulty walking  Muscle weakness (generalized)  Stiffness of left knee, not elsewhere classified  Stiffness of right knee, not elsewhere classified  Rationale for Evaluation and Treatment: Rehabilitation  ONSET DATE: 03/17/2022  SUBJECTIVE STATEMENT:  Pt states she had spasms the day after last session. "I had to call out of work and take a muscle relaxer"   PERTINENT HISTORY:  PAIN:  Are you having pain? Yes: NPRS scale: 2-3/10 "my usual" Pain location: B knees  Pain description: constant ache, doesn't leave  Aggravating factors: WB  Relieving factors: heat, stretches, medicines   PRECAUTIONS: None  WEIGHT BEARING RESTRICTIONS: No  FALLS:  Has patient fallen in last 6 months? 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  PLOF: Independent, Independent with basic ADLs, Independent with gait, and Independent with transfers  PATIENT GOALS: get ready for surgery, find a way to be able to exercise more to lose weight for surgery, general prehab   NEXT MD VISIT:   OBJECTIVE:   DIAGNOSTIC FINDINGS:   PATIENT SURVEYS:  FOTO will do 2nd session    03/25/22: 40% with goal of 55%  05/04/22: 29%   LOWER EXTREMITY ROM:  Active ROM Right eval Left eval Right / Left 04/16/22 Right / Left 2/12  Hip flexion      Hip extension      Hip abduction      Hip adduction      Hip internal rotation      Hip external rotation      Knee flexion 110* 106* 115 / 110 119/116  Knee extension 9* 12* 0 / -8 0/-5  Ankle dorsiflexion      Ankle plantarflexion      Ankle inversion      Ankle eversion       (Blank rows = not tested)  LOWER EXTREMITY MMT:  MMT Right eval Left eval Right / Left 04/16/22 Right / Left 2/12  Hip flexion 3 3 4- 4/4  Hip extension      Hip abduction 3 3 4- 4+/4+  Hip adduction      Hip internal rotation      Hip external rotation      Knee flexion 3- 3-  3+ 4+/4+  Knee extension 4- 4- 4+ 4+/4+  Ankle dorsiflexion      Ankle plantarflexion      Ankle inversion      Ankle eversion       (Blank rows = not tested)  GAIT: Distance walked:28f Assistive device utilized: Single point cane Level of assistance: Modified independence Comments:antalgic   TODAY'S TREATMENT:                                                                                                                              DATE:  05/07/22:  Pt seen for aquatic therapy today.  Treatment took place in water 3.25-4.5 ft in [place] [facility] Temp of water was 91.  Pt entered/exited the pool via stairs independently with bilateral rail. - without support: walking forward/backward and side stepping  - holding wall:  heel raises x 10, hip abdct/ addct x 10 (short and quick) - holding yellow hand floats: leg swings into hip flex/ext x 10each; side squat L/R with shoulder addct - STS at bench with feet on blue step, slow controlled descent - straddling yellow noodle: cycling with doggy paddle arms, jumping jack LEs and cc ski (LE suspended) - back against wall, single leg  noodle press (solid noodle) x 10 each LE Pt requires the  buoyancy and hydrostatic pressure of water for support, and to offload joints by unweighting joint load by at least 50 % in navel deep water and by at least 75-80% in chest to neck deep water.  Viscosity of the water is needed for resistance of strengthening. Water current perturbations provides challenge to standing balance requiring increased core activation.  2/12 Nu-step Lvl 3; 5 min   LAQ 5s hold 2x10 5lbs each Shuttle leg press machine 75lbs DL 3x10 Prone HS curls 5lbs 2x10 each Exam findings, walking program, knee alignment and frictional abrasion on the medial compartment    2/9 Nu-step 5 min  Quad set 2x15 SAQ 2x10 SLR: 2x10  Seated clamshell 2x15 red  Standing heel raise 2x15  Reviewed and updated HEP     04/29/22 Trigger Point Dry-Needling  Treatment instructions: Expect mild to moderate muscle soreness. S/S of pneumothorax if dry needled over a lung field, and to seek immediate medical attention should they occur. Patient verbalized understanding of these instructions and education.   Patient Consent Given: Yes Education (verbally/handout)provided: Yes Muscles treated: R  and L VL, rec fem  Electrical stimulation performed: N/A Parameters:   N/A Treatment response/outcome: LTR elicited with very strong twitch into VL S/L quad stretch with strap 30s 3x STM throughout bilat quad spasm L>R  04/23/22  Nustep L3 5 min LAQs 2lbs 2x10 SLR 10x each  Standing HS curls 2lbs 2x10 Bridges 2x10  STS from high table 2x5 Tandem balance 30s 3x at hallway wall Sidestepping 103f x2 laps   04/21/22  TherEx  LAQs yellow TB x10 B with 10 second holds HS curls yellow TB 2x5 B 3 second holds  Seated clamshells yellow TB x10 2 second holds  Seated marches yellow TB x10 B 2 second holds  Supine quad sets + low range SLR x5 B 0# SAQs 2# 1x5 B  Bridges x10  STS x5      PATIENT EDUCATION:  Education details: aquatics modifications and progressions   Person educated:  Patient Education method: ECustomer service managerEducation comprehension: verbalized understanding and returned demonstration  HOME EXERCISE PROGRAM: Access Code: [email or web address]: [email or web address] Date: 03/20/2022 Prepared by: KDeniece Ree   ASSESSMENT:  CLINICAL IMPRESSION: Pt tolerated exercises in water without increase in pain.  She had improved tolerance for cycling when holding corner in deeper water (vs with doggy paddle arms). Encouraged self care to decrease post-exercise soreness in days to follow. Pt would benefit from continued skilled therapy in order to reach goals and maximize functional bilat LE strength and ROM for prevention of further functional decline.  Initial Assessment: Patient is a 52y.o. F who was seen today for physical therapy evaluation and treatment for B knee pain. Exam is as typical and expected given history and current complaints. Will benefit from skilled PT services to address functional impairments, reduce pain, and assist in preparation for surgery.   OBJECTIVE IMPAIRMENTS: Abnormal gait, difficulty walking, decreased ROM, decreased strength, increased edema, impaired flexibility, obesity, and pain.   ACTIVITY LIMITATIONS: squatting, stairs, transfers, and locomotion level  PARTICIPATION LIMITATIONS: community activity, occupation, yard work, and church  PERSONAL FACTORS: Age, Behavior pattern, and Fitness are also affecting patient's functional outcome.   REHAB POTENTIAL: Excellent  CLINICAL DECISION MAKING: Stable/uncomplicated  EVALUATION COMPLEXITY: Low   GOALS: Goals reviewed with patient? Yes  SHORT TERM GOALS: Target date: 04/17/2022   Will be compliant with appropriate progressive HEP  Baseline: Goal status: ongoing 04/16/22  2.  Pain to be no more than 7/10 at worst  Baseline:  Goal status: Met 04/16/22  3.  B knee extension AROM to be no more than -5 degrees and flexion AROM to be no less than 115  degrees  Baseline:  Goal status: Partially met (RLE not LLE)   LONG TERM GOALS: Target date: 05/29/2022    MMT to be at least 4/5 in all tested groups  Baseline:  Goal status: INITIAL  2.  Pain to be no more than 5/10 at worst  Baseline:  Goal status: INITIAL  3.  Will be compliant in appropriate progressive prehab gym program (land or water) on an independent basis to assist in prehab prior to surgery and in weight loss efforts  Baseline:  Goal status: INITIAL    PLAN:  PT FREQUENCY: 1-2x/week  PT DURATION: 8 weeks  PLANNED INTERVENTIONS: Therapeutic exercises, Therapeutic activity, Neuromuscular re-education, Balance training, Gait training, Patient/Family education, Self Care, Joint mobilization, Stair training, Orthotic/Fit training, DME instructions, Aquatic Therapy, Dry Needling, Electrical stimulation, Spinal mobilization, Cryotherapy, Moist heat, Taping, Ultrasound, Ionotophoresis 37m/ml Dexamethasone, Manual therapy, and Re-evaluation  PLAN FOR NEXT SESSION: strength and ROM, general prehab program, core strength   JKerin Perna PTA 05/07/22 3:50 PM [address] [address]Phone: [phone]  Fax:  [phone]

## 2022-05-08 ENCOUNTER — Encounter (HOSPITAL_BASED_OUTPATIENT_CLINIC_OR_DEPARTMENT_OTHER): Payer: BC Managed Care – PPO | Admitting: Physical Therapy

## 2022-05-12 ENCOUNTER — Other Ambulatory Visit: Payer: Self-pay | Admitting: Nurse Practitioner

## 2022-05-12 ENCOUNTER — Other Ambulatory Visit (HOSPITAL_COMMUNITY): Payer: Self-pay

## 2022-05-12 DIAGNOSIS — J4520 Mild intermittent asthma, uncomplicated: Secondary | ICD-10-CM

## 2022-05-13 ENCOUNTER — Ambulatory Visit (HOSPITAL_BASED_OUTPATIENT_CLINIC_OR_DEPARTMENT_OTHER): Payer: BC Managed Care – PPO | Admitting: Physical Therapy

## 2022-05-13 ENCOUNTER — Encounter (HOSPITAL_BASED_OUTPATIENT_CLINIC_OR_DEPARTMENT_OTHER): Payer: Self-pay | Admitting: Physical Therapy

## 2022-05-13 DIAGNOSIS — M6281 Muscle weakness (generalized): Secondary | ICD-10-CM | POA: Diagnosis not present

## 2022-05-13 DIAGNOSIS — M25572 Pain in left ankle and joints of left foot: Secondary | ICD-10-CM | POA: Diagnosis not present

## 2022-05-13 DIAGNOSIS — M25662 Stiffness of left knee, not elsewhere classified: Secondary | ICD-10-CM | POA: Diagnosis not present

## 2022-05-13 DIAGNOSIS — M25661 Stiffness of right knee, not elsewhere classified: Secondary | ICD-10-CM

## 2022-05-13 DIAGNOSIS — M25571 Pain in right ankle and joints of right foot: Secondary | ICD-10-CM | POA: Diagnosis not present

## 2022-05-13 DIAGNOSIS — R600 Localized edema: Secondary | ICD-10-CM | POA: Diagnosis not present

## 2022-05-13 DIAGNOSIS — R262 Difficulty in walking, not elsewhere classified: Secondary | ICD-10-CM

## 2022-05-13 NOTE — Therapy (Signed)
OUTPATIENT PHYSICAL THERAPY LOWER EXTREMITY TREATMENT   Patient Name: Veronica [name] MRN: [id number] DOB:[birth year], 52 y.o., female Today's Date: 05/13/2022  END OF SESSION:  PT End of Session - 05/13/22 1710     Visit Number 12    Number of Visits 17    Date for PT Re-Evaluation 05/29/22    Authorization Time Period 03/20/22 to 05/29/22    PT Start Time 1631    PT Stop Time 1710    PT Time Calculation (min) 39 min    Activity Tolerance Patient tolerated treatment well    Behavior During Therapy WFL for tasks assessed/performed                 Past Medical History:  Diagnosis Date   Anxiety    Arthritis    Asthma    DM (diabetes mellitus) ([place])    GERD (gastroesophageal reflux disease)    HTN (hypertension)    Obesity    Sleep apnea    Past Surgical History:  Procedure Laterality Date   CESAREAN SECTION     x 3   CHOLECYSTECTOMY     COLONOSCOPY WITH PROPOFOL N/A 03/02/2022   Procedure: COLONOSCOPY WITH PROPOFOL;  Surgeon: Thornton Park, MD;  Location: [facility];  Service: Gastroenterology;  Laterality: N/A;   IR ABLATE LIVER CRYOABLATION  08/21/2021   IR ABLATE LIVER CRYOABLATION  08/21/2021   IR ABLATE LIVER CRYOABLATION  08/21/2021   IR ABLATE LIVER CRYOABLATION  09/17/2021   IR ABLATE LIVER CRYOABLATION  09/17/2021   IR ABLATE LIVER CRYOABLATION  09/17/2021   IR ABLATE LIVER CRYOABLATION  09/17/2021   IR RADIOLOGIST EVAL & MGMT  08/04/2021   IR RADIOLOGIST EVAL & MGMT  09/11/2021   IR RADIOLOGIST EVAL & MGMT  10/13/2021   LAPAROSCOPIC GASTRIC BANDING     SUPRACERVICAL ABDOMINAL HYSTERECTOMY  2017   Patient Active Problem List   Diagnosis Date Noted   Special screening for malignant neoplasms, colon 03/02/2022   Hyperlipidemia associated with type 2 diabetes mellitus ([place]) 09/29/2021   Vaginal septum 09/16/2021   Vitreomacular adhesion of both eyes 09/11/2021   Moderate persistent asthma with exacerbation 07/11/2021   HTN (hypertension),  benign 07/11/2021   Type 2 diabetes mellitus with hyperglycemia, without long-term current use of insulin ([place]) 07/02/2021   Morbid obesity ([place]) 07/01/2021   Vaginal bleeding 05/12/2021   Pain in joint involving multiple sites 03/04/2021   History of colon polyps 03/04/2021   OSA (obstructive sleep apnea) 06/13/2020   GAD (generalized anxiety disorder) 04/10/2020   Memory difficulty 04/10/2020   Asthma 04/09/2020   S/P abdominal supracervical subtotal hysterectomy 04/09/2020   Insomnia 04/09/2020    PCP: Flossie Buffy NP   REFERRING PROVIDER: Melrose Nakayama, MD  REFERRING DIAG: M25.561 (ICD-10-CM) - Pain in right knee  THERAPY DIAG:  Difficulty walking  Muscle weakness (generalized)  Stiffness of left knee, not elsewhere classified  Stiffness of right knee, not elsewhere classified  Rationale for Evaluation and Treatment: Rehabilitation  ONSET DATE: 03/17/2022  SUBJECTIVE STATEMENT:  Pt states she has pain usually a day to a day and a half after therapy   PERTINENT HISTORY:  PAIN:  Are you having pain? Yes: NPRS scale: 2-3/10 "my usual" Pain location: B knees  Pain description: constant ache, doesn't leave  Aggravating factors: WB  Relieving factors: heat, stretches, medicines   PRECAUTIONS: None  WEIGHT BEARING RESTRICTIONS: No  FALLS:  Has patient fallen in last 6 months? 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  PLOF: Independent, Independent with basic ADLs, Independent with gait, and Independent with transfers  PATIENT GOALS: get ready for surgery, find a way to be able to exercise more to lose weight for surgery, general prehab   NEXT MD VISIT:   OBJECTIVE:   DIAGNOSTIC FINDINGS:   PATIENT SURVEYS:  FOTO will do  2nd session   03/25/22: 40% with goal of 55%  05/04/22: 29%   LOWER EXTREMITY ROM:  Active ROM Right eval Left eval Right / Left 04/16/22 Right / Left 2/12  Hip flexion      Hip extension      Hip abduction      Hip adduction      Hip internal rotation      Hip external rotation      Knee flexion 110* 106* 115 / 110 119/116  Knee extension 9* 12* 0 / -8 0/-5  Ankle dorsiflexion      Ankle plantarflexion      Ankle inversion      Ankle eversion       (Blank rows = not tested)  LOWER EXTREMITY MMT:  MMT Right eval Left eval Right / Left 04/16/22 Right / Left 2/12  Hip flexion 3 3 4- 4/4  Hip extension      Hip abduction 3 3 4- 4+/4+  Hip adduction      Hip internal rotation      Hip external rotation      Knee flexion 3- 3-  3+ 4+/4+  Knee extension 4- 4- 4+ 4+/4+  Ankle dorsiflexion      Ankle plantarflexion      Ankle inversion      Ankle eversion       (Blank rows = not tested)  GAIT: Distance walked:72f Assistive device utilized: Single point cane Level of assistance: Modified independence Comments:antalgic   TODAY'S TREATMENT:                                                                                                                              DATE:  05/13/22:  Pt seen for aquatic therapy today.  Treatment took place in water 3.25-4.5 ft in [place] at the [facility] Temp of water was 91.  Pt entered/exited the pool via stairs independently with bilateral rail. - without support: walking forward/backward and side stepping multiple widths - holding wall:  heel raises x 10, hip abdct/ addct;   - holding yellow hand floats: leg swings into hip flex/ext x 10 each; mini squats x 10; 3 way tap; -side lunges with shoulder addct x 4 widths - ue support yellow hand buoys: single leg noodle press (solid noodle) x 12 each LE hip in neutral then externally rotated -  STS from 3rd step (bottom) 10x.  Gains immediate standing balance 10/10 x with  occasional unsteadiness.   Pt requires the buoyancy and hydrostatic pressure of water for support, and to offload joints by unweighting joint load by at least 50 % in navel deep water and by at least 75-80% in chest to neck deep water.  Viscosity of the water is needed for resistance of strengthening. Water current perturbations provides challenge to standing balance requiring increased core activation.  2/12 Nu-step Lvl 3; 5 min   LAQ 5s hold 2x10 5lbs each Shuttle leg press machine 75lbs DL 3x10 Prone HS curls 5lbs 2x10 each Exam findings, walking program, knee alignment and frictional abrasion on the medial compartment    2/9 Nu-step 5 min  Quad set 2x15 SAQ 2x10 SLR: 2x10  Seated clamshell 2x15 red  Standing heel raise 2x15  Reviewed and updated HEP     04/29/22 Trigger Point Dry-Needling  Treatment instructions: Expect mild to moderate muscle soreness. S/S of pneumothorax if dry needled over a lung field, and to seek immediate medical attention should they occur. Patient verbalized understanding of these instructions and education.   Patient Consent Given: Yes Education (verbally/handout)provided: Yes Muscles treated: R  and L VL, rec fem  Electrical stimulation performed: N/A Parameters:   N/A Treatment response/outcome: LTR elicited with very strong twitch into VL S/L quad stretch with strap 30s 3x STM throughout bilat quad spasm L>R  04/23/22  Nustep L3 5 min LAQs 2lbs 2x10 SLR 10x each  Standing HS curls 2lbs 2x10 Bridges 2x10  STS from high table 2x5 Tandem balance 30s 3x at hallway wall Sidestepping 28f x2 laps   04/21/22  TherEx  LAQs yellow TB x10 B with 10 second holds HS curls yellow TB 2x5 B 3 second holds  Seated clamshells yellow TB x10 2 second holds  Seated marches yellow TB x10 B 2 second holds  Supine quad sets + low range SLR x5 B 0# SAQs 2# 1x5 B  Bridges x10  STS x5      PATIENT EDUCATION:  Education details: aquatics  modifications and progressions   Person educated: Patient Education method: ECustomer service managerEducation comprehension: verbalized understanding and returned demonstration  HOME EXERCISE PROGRAM: Access Code: [email or web address]: [email or web address] Date: 03/20/2022 Prepared by: KDeniece Ree   ASSESSMENT:  CLINICAL IMPRESSION: Pt tolerates progression of exercises well with decreased ue support engaging core with added intensity for strengthening and balance. Goals ongoing   Initial Assessment: Patient is a 52y.o. F who was seen today for physical therapy evaluation and treatment for B knee pain. Exam is as typical and expected given history and current complaints. Will benefit from skilled PT services to address functional impairments, reduce pain, and assist in preparation for surgery.   OBJECTIVE IMPAIRMENTS: Abnormal gait, difficulty walking, decreased ROM, decreased strength, increased edema, impaired flexibility, obesity, and pain.   ACTIVITY LIMITATIONS: squatting, stairs, transfers, and locomotion level  PARTICIPATION LIMITATIONS: community activity, occupation, yard work, and church  PERSONAL FACTORS: Age, Behavior pattern, and Fitness are also affecting patient's functional outcome.   REHAB POTENTIAL: Excellent  CLINICAL DECISION MAKING: Stable/uncomplicated  EVALUATION COMPLEXITY: Low   GOALS: Goals reviewed with patient? Yes  SHORT TERM GOALS: Target date: 04/17/2022   Will be compliant with appropriate progressive HEP  Baseline: Goal status: ongoing 04/16/22  2.  Pain to be no more than 7/10 at worst  Baseline:  Goal status: Met 04/16/22  3.  B knee extension AROM to  be no more than -5 degrees and flexion AROM to be no less than 115 degrees  Baseline:  Goal status: Partially met (RLE not LLE)   LONG TERM GOALS: Target date: 05/29/2022    MMT to be at least 4/5 in all tested groups  Baseline:  Goal status: INITIAL  2.  Pain  to be no more than 5/10 at worst  Baseline:  Goal status: INITIAL  3.  Will be compliant in appropriate progressive prehab gym program (land or water) on an independent basis to assist in prehab prior to surgery and in weight loss efforts  Baseline:  Goal status: INITIAL    PLAN:  PT FREQUENCY: 1-2x/week  PT DURATION: 8 weeks  PLANNED INTERVENTIONS: Therapeutic exercises, Therapeutic activity, Neuromuscular re-education, Balance training, Gait training, Patient/Family education, Self Care, Joint mobilization, Stair training, Orthotic/Fit training, DME instructions, Aquatic Therapy, Dry Needling, Electrical stimulation, Spinal mobilization, Cryotherapy, Moist heat, Taping, Ultrasound, Ionotophoresis 66m/ml Dexamethasone, Manual therapy, and Re-evaluation  PLAN FOR NEXT SESSION: strength and ROM, general prehab program, core strength   MAnnamarie Major Jayren Cease MPT 05/13/22 5:11 PM [facility] [address]Phone: [phone]  Fax:  [phone]

## 2022-05-15 ENCOUNTER — Ambulatory Visit (HOSPITAL_BASED_OUTPATIENT_CLINIC_OR_DEPARTMENT_OTHER): Payer: BC Managed Care – PPO | Admitting: Physical Therapy

## 2022-05-15 ENCOUNTER — Encounter (HOSPITAL_BASED_OUTPATIENT_CLINIC_OR_DEPARTMENT_OTHER): Payer: Self-pay | Admitting: Physical Therapy

## 2022-05-15 DIAGNOSIS — M6281 Muscle weakness (generalized): Secondary | ICD-10-CM

## 2022-05-15 DIAGNOSIS — M25662 Stiffness of left knee, not elsewhere classified: Secondary | ICD-10-CM | POA: Diagnosis not present

## 2022-05-15 DIAGNOSIS — M25661 Stiffness of right knee, not elsewhere classified: Secondary | ICD-10-CM | POA: Diagnosis not present

## 2022-05-15 DIAGNOSIS — R262 Difficulty in walking, not elsewhere classified: Secondary | ICD-10-CM | POA: Diagnosis not present

## 2022-05-15 DIAGNOSIS — R600 Localized edema: Secondary | ICD-10-CM | POA: Diagnosis not present

## 2022-05-15 DIAGNOSIS — M25572 Pain in left ankle and joints of left foot: Secondary | ICD-10-CM | POA: Diagnosis not present

## 2022-05-15 DIAGNOSIS — M25571 Pain in right ankle and joints of right foot: Secondary | ICD-10-CM | POA: Diagnosis not present

## 2022-05-15 NOTE — Therapy (Signed)
OUTPATIENT PHYSICAL THERAPY LOWER EXTREMITY TREATMENT   Patient Name: Veronica [name] MRN: [id number] DOB:[birth year], 52 y.o., female Today's Date: 05/13/2022  END OF SESSION:  PT End of Session - 05/13/22 1710     Visit Number 12    Number of Visits 17    Date for PT Re-Evaluation 05/29/22    Authorization Time Period 03/20/22 to 05/29/22    PT Start Time 1631    PT Stop Time 1710    PT Time Calculation (min) 39 min    Activity Tolerance Patient tolerated treatment well    Behavior During Therapy WFL for tasks assessed/performed                 Past Medical History:  Diagnosis Date   Anxiety    Arthritis    Asthma    DM (diabetes mellitus) ([place])    GERD (gastroesophageal reflux disease)    HTN (hypertension)    Obesity    Sleep apnea    Past Surgical History:  Procedure Laterality Date   CESAREAN SECTION     x 3   CHOLECYSTECTOMY     COLONOSCOPY WITH PROPOFOL N/A 03/02/2022   Procedure: COLONOSCOPY WITH PROPOFOL;  Surgeon: Thornton Park, MD;  Location: [facility];  Service: Gastroenterology;  Laterality: N/A;   IR ABLATE LIVER CRYOABLATION  08/21/2021   IR ABLATE LIVER CRYOABLATION  08/21/2021   IR ABLATE LIVER CRYOABLATION  08/21/2021   IR ABLATE LIVER CRYOABLATION  09/17/2021   IR ABLATE LIVER CRYOABLATION  09/17/2021   IR ABLATE LIVER CRYOABLATION  09/17/2021   IR ABLATE LIVER CRYOABLATION  09/17/2021   IR RADIOLOGIST EVAL & MGMT  08/04/2021   IR RADIOLOGIST EVAL & MGMT  09/11/2021   IR RADIOLOGIST EVAL & MGMT  10/13/2021   LAPAROSCOPIC GASTRIC BANDING     SUPRACERVICAL ABDOMINAL HYSTERECTOMY  2017   Patient Active Problem List   Diagnosis Date Noted   Special screening for malignant neoplasms, colon 03/02/2022   Hyperlipidemia associated with type 2 diabetes mellitus ([place]) 09/29/2021   Vaginal septum 09/16/2021   Vitreomacular adhesion of both eyes 09/11/2021   Moderate persistent asthma with exacerbation 07/11/2021   HTN (hypertension),  benign 07/11/2021   Type 2 diabetes mellitus with hyperglycemia, without long-term current use of insulin ([place]) 07/02/2021   Morbid obesity ([place]) 07/01/2021   Vaginal bleeding 05/12/2021   Pain in joint involving multiple sites 03/04/2021   History of colon polyps 03/04/2021   OSA (obstructive sleep apnea) 06/13/2020   GAD (generalized anxiety disorder) 04/10/2020   Memory difficulty 04/10/2020   Asthma 04/09/2020   S/P abdominal supracervical subtotal hysterectomy 04/09/2020   Insomnia 04/09/2020    PCP: Flossie Buffy NP   REFERRING PROVIDER: Melrose Nakayama, MD  REFERRING DIAG: M25.561 (ICD-10-CM) - Pain in right knee  THERAPY DIAG:  Difficulty walking  Muscle weakness (generalized)  Stiffness of left knee, not elsewhere classified  Stiffness of right knee, not elsewhere classified  Rationale for Evaluation and Treatment: Rehabilitation  ONSET DATE: 03/17/2022  SUBJECTIVE STATEMENT:  Pt states she has pain usually a day to a day and a half after therapy   PERTINENT HISTORY:  PAIN:  Are you having pain? Yes: NPRS scale: 2-3/10 "my usual" Pain location: B knees  Pain description: constant ache, doesn't leave  Aggravating factors: WB  Relieving factors: heat, stretches, medicines   PRECAUTIONS: None  WEIGHT BEARING RESTRICTIONS: No  FALLS:  Has patient fallen in last 6 months? 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  PLOF: Independent, Independent with basic ADLs, Independent with gait, and Independent with transfers  PATIENT GOALS: get ready for surgery, find a way to be able to exercise more to lose weight for surgery, general prehab   NEXT MD VISIT:   OBJECTIVE:   DIAGNOSTIC FINDINGS:   PATIENT SURVEYS:  FOTO will do  2nd session   03/25/22: 40% with goal of 55%  05/04/22: 29%   LOWER EXTREMITY ROM:  Active ROM Right eval Left eval Right / Left 04/16/22 Right / Left 2/12  Hip flexion      Hip extension      Hip abduction      Hip adduction      Hip internal rotation      Hip external rotation      Knee flexion 110* 106* 115 / 110 119/116  Knee extension 9* 12* 0 / -8 0/-5  Ankle dorsiflexion      Ankle plantarflexion      Ankle inversion      Ankle eversion       (Blank rows = not tested)  LOWER EXTREMITY MMT:  MMT Right eval Left eval Right / Left 04/16/22 Right / Left 2/12  Hip flexion 3 3 4- 4/4  Hip extension      Hip abduction 3 3 4- 4+/4+  Hip adduction      Hip internal rotation      Hip external rotation      Knee flexion 3- 3-  3+ 4+/4+  Knee extension 4- 4- 4+ 4+/4+  Ankle dorsiflexion      Ankle plantarflexion      Ankle inversion      Ankle eversion       (Blank rows = not tested)  GAIT: Distance walked:68f Assistive device utilized: Single point cane Level of assistance: Modified independence Comments:antalgic   TODAY'S TREATMENT:                                                                                                                              DATE:  2/23 Nu-step 5 min  Quad set 2x15 SAQ 2x10 SLR: 2x10  Seated clamshell 2x15 red  Standing heel raise 2x15  Bridge 2x10 Reviewed and updated HEP  Standing march x20   Reviewed patella mobilization. Reviewed self soft tissue mobilization of both knees.     05/13/22:  Pt seen for aquatic therapy today.  Treatment took place in [place] 3.25[place]4.[place] [facility] [place] 91.  Pt entered/exited the pool via stairs independently with bilateral rail. - without support: walking forward/backward and side stepping multiple widths - holding wall:  heel raises x 10, hip abdct/ addct;   -  holding yellow hand floats: leg swings into hip flex/ext x 10 each; mini squats x  10; 3 way tap; -side lunges with shoulder addct x 4 widths - ue support yellow hand buoys: single leg noodle press (solid noodle) x 12 each LE hip in neutral then externally rotated - STS from 3rd step (bottom) 10x.  Gains immediate standing balance 10/10 x with occasional unsteadiness.   Pt requires the buoyancy and hydrostatic pressure of water for support, and to offload joints by unweighting joint load by at least 50 % in navel deep water and by at least 75-80% in chest to neck deep water.  Viscosity of the water is needed for resistance of strengthening. Water current perturbations provides challenge to standing balance requiring increased core activation.  2/12 Nu-step Lvl 3; 5 min   LAQ 5s hold 2x10 5lbs each Shuttle leg press machine 75lbs DL 3x10 Prone HS curls 5lbs 2x10 each Exam findings, walking program, knee alignment and frictional abrasion on the medial compartment    2/9 Nu-step 5 min  Quad set 2x15 SAQ 2x10 SLR: 2x10  Seated clamshell 2x15 red  Standing heel raise 2x15  Reviewed and updated HEP     04/29/22 Trigger Point Dry-Needling  Treatment instructions: Expect mild to moderate muscle soreness. S/S of pneumothorax if dry needled over a lung field, and to seek immediate medical attention should they occur. Patient verbalized understanding of these instructions and education.   Patient Consent Given: Yes Education (verbally/handout)provided: Yes Muscles treated: R  and L VL, rec fem  Electrical stimulation performed: N/A Parameters:   N/A Treatment response/outcome: LTR elicited with very strong twitch into VL S/L quad stretch with strap 30s 3x STM throughout bilat quad spasm L>R  04/23/22  Nustep L3 5 min LAQs 2lbs 2x10 SLR 10x each  Standing HS curls 2lbs 2x10 Bridges 2x10  STS from high table 2x5 Tandem balance 30s 3x at hallway wall Sidestepping 38f x2 laps   04/21/22  TherEx  LAQs yellow TB x10 B with 10 second holds HS curls yellow TB  2x5 B 3 second holds  Seated clamshells yellow TB x10 2 second holds  Seated marches yellow TB x10 B 2 second holds  Supine quad sets + low range SLR x5 B 0# SAQs 2# 1x5 B  Bridges x10  STS x5      PATIENT EDUCATION:  Education details: aquatics modifications and progressions   Person educated: Patient Education method: ECustomer service managerEducation comprehension: verbalized understanding and returned demonstration  HOME EXERCISE PROGRAM: Access Code: [email or web address]: [email or web address] Date: 03/20/2022 Prepared by: KDeniece Ree   ASSESSMENT:  CLINICAL IMPRESSION: The patient came in sore today. We reviewed self soft tissue mobilization to her quads and also self patella mobilization. She tolerated well but had several trigger points in her quads. We reviewed her HEP. She tolerated well despite her pain. We will continue to progress as tolerated.   Initial Assessment: Patient is a 52y.o. F who was seen today for physical therapy evaluation and treatment for B knee pain. Exam is as typical and expected given history and current complaints. Will benefit from skilled PT services to address functional impairments, reduce pain, and assist in preparation for surgery.   OBJECTIVE IMPAIRMENTS: Abnormal gait, difficulty walking, decreased ROM, decreased strength, increased edema, impaired flexibility, obesity, and pain.   ACTIVITY LIMITATIONS: squatting, stairs, transfers, and locomotion level  PARTICIPATION LIMITATIONS: community activity, occupation, yard work, and church  PERSONAL FACTORS: Age, Behavior pattern, and Fitness  are also affecting patient's functional outcome.   REHAB POTENTIAL: Excellent  CLINICAL DECISION MAKING: Stable/uncomplicated  EVALUATION COMPLEXITY: Low   GOALS: Goals reviewed with patient? Yes  SHORT TERM GOALS: Target date: 04/17/2022   Will be compliant with appropriate progressive HEP  Baseline: Goal status: ongoing  04/16/22  2.  Pain to be no more than 7/10 at worst  Baseline:  Goal status: Met 04/16/22  3.  B knee extension AROM to be no more than -5 degrees and flexion AROM to be no less than 115 degrees  Baseline:  Goal status: Partially met (RLE not LLE)   LONG TERM GOALS: Target date: 05/29/2022    MMT to be at least 4/5 in all tested groups  Baseline:  Goal status: INITIAL  2.  Pain to be no more than 5/10 at worst  Baseline:  Goal status: INITIAL  3.  Will be compliant in appropriate progressive prehab gym program (land or water) on an independent basis to assist in prehab prior to surgery and in weight loss efforts  Baseline:  Goal status: INITIAL    PLAN:  PT FREQUENCY: 1-2x/week  PT DURATION: 8 weeks  PLANNED INTERVENTIONS: Therapeutic exercises, Therapeutic activity, Neuromuscular re-education, Balance training, Gait training, Patient/Family education, Self Care, Joint mobilization, Stair training, Orthotic/Fit training, DME instructions, Aquatic Therapy, Dry Needling, Electrical stimulation, Spinal mobilization, Cryotherapy, Moist heat, Taping, Ultrasound, Ionotophoresis '4mg'$ /ml Dexamethasone, Manual therapy, and Re-evaluation  PLAN FOR NEXT SESSION: strength and ROM, general prehab program, core strength   Carolyne Littles PT DPT  05/13/22 5:11 PM [facility] [address] [place], [place], [address] Phone: [phone]   Fax:  [phone]

## 2022-05-18 ENCOUNTER — Other Ambulatory Visit (HOSPITAL_COMMUNITY): Payer: Self-pay

## 2022-05-19 ENCOUNTER — Encounter (HOSPITAL_BASED_OUTPATIENT_CLINIC_OR_DEPARTMENT_OTHER): Payer: Self-pay

## 2022-05-19 ENCOUNTER — Ambulatory Visit (HOSPITAL_BASED_OUTPATIENT_CLINIC_OR_DEPARTMENT_OTHER): Payer: BC Managed Care – PPO | Admitting: Physical Therapy

## 2022-05-29 ENCOUNTER — Ambulatory Visit (HOSPITAL_BASED_OUTPATIENT_CLINIC_OR_DEPARTMENT_OTHER): Payer: BC Managed Care – PPO | Admitting: Physical Therapy

## 2022-06-08 ENCOUNTER — Other Ambulatory Visit: Payer: Self-pay | Admitting: Nurse Practitioner

## 2022-06-08 ENCOUNTER — Ambulatory Visit: Payer: BC Managed Care – PPO | Admitting: Nurse Practitioner

## 2022-06-08 DIAGNOSIS — I10 Essential (primary) hypertension: Secondary | ICD-10-CM

## 2022-06-11 ENCOUNTER — Encounter (HOSPITAL_BASED_OUTPATIENT_CLINIC_OR_DEPARTMENT_OTHER): Payer: Self-pay | Admitting: Physical Therapy

## 2022-06-15 ENCOUNTER — Encounter: Payer: Self-pay | Admitting: Nurse Practitioner

## 2022-06-15 ENCOUNTER — Ambulatory Visit (INDEPENDENT_AMBULATORY_CARE_PROVIDER_SITE_OTHER): Payer: BC Managed Care – PPO | Admitting: Nurse Practitioner

## 2022-06-15 VITALS — BP 120/82 | HR 89 | Temp 98.2°F | Resp 16 | Ht 64.0 in | Wt 272.0 lb

## 2022-06-15 DIAGNOSIS — E785 Hyperlipidemia, unspecified: Secondary | ICD-10-CM | POA: Diagnosis not present

## 2022-06-15 DIAGNOSIS — I10 Essential (primary) hypertension: Secondary | ICD-10-CM

## 2022-06-15 DIAGNOSIS — E1169 Type 2 diabetes mellitus with other specified complication: Secondary | ICD-10-CM | POA: Diagnosis not present

## 2022-06-15 DIAGNOSIS — R748 Abnormal levels of other serum enzymes: Secondary | ICD-10-CM

## 2022-06-15 MED ORDER — OZEMPIC (2 MG/DOSE) 8 MG/3ML ~~LOC~~ SOPN: 2.0000 mg | PEN_INJECTOR | SUBCUTANEOUS | 0 refills | Status: DC

## 2022-06-15 MED ORDER — NALTREXONE-BUPROPION HCL ER 8-90 MG PO TB12: 1.0000 | ORAL_TABLET | Freq: Every morning | ORAL | 5 refills | Status: DC

## 2022-06-15 NOTE — Assessment & Plan Note (Addendum)
Maintain atorvastatin dose Repeat lipid panel

## 2022-06-15 NOTE — Patient Instructions (Signed)
GO TO LAB.

## 2022-06-15 NOTE — Progress Notes (Signed)
Established Patient Visit  Patient: Veronica [name]   DOB: 1970/09/15   52 y.o. Female  MRN: [id number] Visit Date: 06/15/2022  Subjective:    Chief Complaint  Patient presents with   Medical Management of Chronic Issues    Fasting- no    HPI Type 2 diabetes mellitus with hyperglycemia, without long-term current use of insulin (HCC) No adverse effect with ozempic 2mg  weekly Continues to have difficulty with obesity, and elevated LDL  Repeat hgbA1c and CMP  HTN (hypertension), benign BP at goal with losartan BP Readings from Last 3 Encounters:  06/15/22 120/82  03/09/22 122/84  03/02/22 (!) 148/100    Maintain med dose Repeat CMP  Morbid obesity (HCC) BMI at 46.69 Lost 6lbs in last 69months Total weight loss of 26lbs in last 55months Exacerbates HTN, OSA, bilateral knee pain due to advanced osteoarthritis, and elevated LDL Continues to struggle with food cravings (carbs and sugar) despite use of ozempic. This is worse at night. She does not have consistent exercise routine. She plans to join a GYM to start water aerobics. BP Readings from Last 3 Encounters:  06/15/22 120/82  03/09/22 122/84  03/02/22 (!) 148/100    We discussed use of contrave and possible side effects. She verbalized understanding and agreed to start med. Rx sent F/up in 41month  Hyperlipidemia associated with type 2 diabetes mellitus (HCC) Maintain atorvastatin dose Repeat lipid panel  BP Readings from Last 3 Encounters:  06/15/22 120/82  03/09/22 122/84  03/02/22 (!) 148/100    Wt Readings from Last 3 Encounters:  06/15/22 272 lb (123.4 kg)  03/09/22 278 lb (126.1 kg)  03/02/22 284 lb 6.3 oz (129 kg)    Reviewed medical, surgical, and social history today  Medications: Outpatient Medications Prior to Visit  Medication Sig   albuterol (VENTOLIN HFA) 108 (90 Base) MCG/ACT inhaler INHALE 1-2 PUFFS BY MOUTH EVERY 6 HOURS AS NEEDED FOR WHEEZE OR SHORTNESS OF BREATH   Ascorbic  Acid (VITAMIN C) 1000 MG tablet Take 1,000 mg by mouth daily.   atorvastatin (LIPITOR) 20 MG tablet Take 1 tablet (20 mg total) by mouth daily.   CELEBREX 200 MG capsule Take 200 mg by mouth daily as needed.   Chlorphen-PE-Acetaminophen (TYLENOL ALLERGY MULTI-SYMPTOM PO) Take 2 tablets by mouth every 4 (four) hours as needed (allergies).   cyanocobalamin (VITAMIN B12) 1000 MCG tablet Take 1,000 mcg by mouth daily.   DULoxetine (CYMBALTA) 20 MG capsule Take 1 capsule (20 mg total) by mouth daily.   fluticasone (FLONASE) 50 MCG/ACT nasal spray Place 2 sprays into both nostrils daily. (Patient taking differently: Place 2 sprays into both nostrils daily as needed for allergies.)   losartan (COZAAR) 25 MG tablet TAKE 1 TABLET (25 MG TOTAL) BY MOUTH DAILY.   montelukast (SINGULAIR) 10 MG tablet TAKE 1 TABLET BY MOUTH EVERYDAY AT BEDTIME   Multiple Vitamin (MULTIVITAMIN) tablet Take 1 tablet by mouth daily.   polyethylene glycol (MIRALAX) 17 g packet Take 17 g by mouth daily. (Patient taking differently: Take 17 g by mouth daily as needed for moderate constipation.)   [DISCONTINUED] Semaglutide, 2 MG/DOSE, (OZEMPIC, 2 MG/DOSE,) 8 MG/3ML SOPN Inject 2 mg into the skin once a week.   No facility-administered medications prior to visit.   Reviewed past medical and social history.   ROS per HPI above      Objective:  BP 120/82 (BP Location: Left Arm, Patient Position:  Sitting, Cuff Size: Large)   Pulse 89   Temp 98.2 F (36.8 C) (Temporal)   Resp 16   Ht 5\' 4"  (1.626 m)   Wt 272 lb (123.4 kg)   LMP 06/15/2010   SpO2 97%   BMI 46.69 kg/m      Physical Exam Constitutional:      Appearance: She is obese.  Cardiovascular:     Rate and Rhythm: Normal rate and regular rhythm.     Pulses: Normal pulses.     Heart sounds: Normal heart sounds.  Pulmonary:     Effort: Pulmonary effort is normal.     Breath sounds: Normal breath sounds.  Neurological:     Mental Status: She is alert and  oriented to person, place, and time.  Psychiatric:        Mood and Affect: Mood normal.        Behavior: Behavior normal.        Thought Content: Thought content normal.     No results found for any visits on 06/15/22.    Assessment & Plan:    Problem List Items Addressed This Visit       Cardiovascular and Mediastinum   HTN (hypertension), benign    BP at goal with losartan BP Readings from Last 3 Encounters:  06/15/22 120/82  03/09/22 122/84  03/02/22 (!) 148/100    Maintain med dose Repeat CMP      Relevant Orders   Comprehensive metabolic panel     Endocrine   Hyperlipidemia associated with type 2 diabetes mellitus ([place])    Maintain atorvastatin dose Repeat lipid panel      Relevant Medications   Semaglutide, 2 MG/DOSE, (OZEMPIC, 2 MG/DOSE,) 8 MG/3ML SOPN   Other Relevant Orders   Comprehensive metabolic panel   Direct LDL   Type 2 diabetes mellitus with hyperglycemia, without long-term current use of insulin (HCC) - Primary    No adverse effect with ozempic 2mg  weekly Continues to have difficulty with obesity, and elevated LDL  Repeat hgbA1c and CMP      Relevant Medications   Semaglutide, 2 MG/DOSE, (OZEMPIC, 2 MG/DOSE,) 8 MG/3ML SOPN     Other   Morbid obesity (HCC)    BMI at 46.69 Lost 6lbs in last 88months Total weight loss of 26lbs in last 53months Exacerbates HTN, OSA, bilateral knee pain due to advanced osteoarthritis, and elevated LDL Continues to struggle with food cravings (carbs and sugar) despite use of ozempic. This is worse at night. She does not have consistent exercise routine. She plans to join a GYM to start water aerobics. BP Readings from Last 3 Encounters:  06/15/22 120/82  03/09/22 122/84  03/02/22 (!) 148/100    We discussed use of contrave and possible side effects. She verbalized understanding and agreed to start med. Rx sent F/up in 59month      Relevant Medications   Semaglutide, 2 MG/DOSE, (OZEMPIC, 2 MG/DOSE,) 8 MG/3ML  SOPN   Naltrexone-buPROPion HCl ER 8-90 MG TB12   Return in about 4 weeks (around 07/13/2022) for Weight management, HTN (video).     Wilfred Lacy, NP

## 2022-06-15 NOTE — Assessment & Plan Note (Signed)
BP at goal with losartan BP Readings from Last 3 Encounters:  06/15/22 120/82  03/09/22 122/84  03/02/22 (!) 148/100    Maintain med dose Repeat CMP

## 2022-06-15 NOTE — Assessment & Plan Note (Addendum)
No adverse effect with ozempic 2mg  weekly Continues to have difficulty with obesity, and elevated LDL  Repeat hgbA1c and CMP

## 2022-06-15 NOTE — Assessment & Plan Note (Addendum)
BMI at 46.69 Lost 6lbs in last 68months Total weight loss of 26lbs in last 40months Exacerbates HTN, OSA, bilateral knee pain due to advanced osteoarthritis, and elevated LDL Continues to struggle with food cravings (carbs and sugar) despite use of ozempic. This is worse at night. She does not have consistent exercise routine. She plans to join a GYM to start water aerobics. BP Readings from Last 3 Encounters:  06/15/22 120/82  03/09/22 122/84  03/02/22 (!) 148/100    We discussed use of contrave and possible side effects. She verbalized understanding and agreed to start med. Rx sent F/up in 61month

## 2022-06-16 ENCOUNTER — Encounter (HOSPITAL_BASED_OUTPATIENT_CLINIC_OR_DEPARTMENT_OTHER): Payer: Self-pay

## 2022-06-16 ENCOUNTER — Other Ambulatory Visit: Payer: BC Managed Care – PPO

## 2022-06-16 ENCOUNTER — Ambulatory Visit (HOSPITAL_BASED_OUTPATIENT_CLINIC_OR_DEPARTMENT_OTHER): Payer: BC Managed Care – PPO | Admitting: Physical Therapy

## 2022-06-16 DIAGNOSIS — M170 Bilateral primary osteoarthritis of knee: Secondary | ICD-10-CM | POA: Diagnosis not present

## 2022-06-16 DIAGNOSIS — M1711 Unilateral primary osteoarthritis, right knee: Secondary | ICD-10-CM | POA: Diagnosis not present

## 2022-06-16 DIAGNOSIS — M1712 Unilateral primary osteoarthritis, left knee: Secondary | ICD-10-CM | POA: Diagnosis not present

## 2022-06-16 LAB — COMPREHENSIVE METABOLIC PANEL
ALT: 13 U/L (ref 0–35)
AST: 15 U/L (ref 0–37)
Albumin: 4 g/dL (ref 3.5–5.2)
Alkaline Phosphatase: 130 U/L — ABNORMAL HIGH (ref 39–117)
BUN: 13 mg/dL (ref 6–23)
CO2: 30 mEq/L (ref 19–32)
Calcium: 9.2 mg/dL (ref 8.4–10.5)
Chloride: 102 mEq/L (ref 96–112)
Creatinine, Ser: 0.79 mg/dL (ref 0.40–1.20)
GFR: 84.95 mL/min (ref >60.00)
Glucose, Bld: 79 mg/dL (ref 70–99)
Potassium: 3.7 mEq/L (ref 3.5–5.1)
Sodium: 140 mEq/L (ref 135–145)
Total Bilirubin: 0.5 mg/dL (ref 0.2–1.2)
Total Protein: 7.3 g/dL (ref 6.0–8.3)

## 2022-06-16 LAB — HEMOGLOBIN A1C: Hgb A1c MFr Bld: 6 % (ref 4.6–6.5)

## 2022-06-16 LAB — LDL CHOLESTEROL, DIRECT: Direct LDL: 73 mg/dL

## 2022-06-16 NOTE — Progress Notes (Signed)
Patient came in for lab visit. Labs drawn successfully

## 2022-06-17 ENCOUNTER — Telehealth: Payer: Self-pay | Admitting: Nurse Practitioner

## 2022-06-17 NOTE — Therapy (Signed)
OUTPATIENT PHYSICAL THERAPY LOWER EXTREMITY TREATMENT   Patient Name: Veronica [name] MRN: [id number] DOB:[birth year], 52 y.o., female Today's Date: 06/18/2022  END OF SESSION:  PT End of Session - 06/18/22 0941     Visit Number 14    Number of Visits --    Date for PT Re-Evaluation 10/21/22    PT Start Time 0940    PT Stop Time 1018    PT Time Calculation (min) 38 min    Activity Tolerance Patient tolerated treatment well    Behavior During Therapy [facility] for tasks assessed/performed                  Past Medical History:  Diagnosis Date   Anxiety    Arthritis    Asthma    DM (diabetes mellitus) ([place])    GERD (gastroesophageal reflux disease)    HTN (hypertension)    Obesity    Sleep apnea    Vaginal bleeding 05/12/2021   Past Surgical History:  Procedure Laterality Date   CESAREAN SECTION     x 3   CHOLECYSTECTOMY     COLONOSCOPY WITH PROPOFOL N/A 03/02/2022   Procedure: COLONOSCOPY WITH PROPOFOL;  Surgeon: Thornton Park, MD;  Location: [facility];  Service: Gastroenterology;  Laterality: N/A;   IR ABLATE LIVER CRYOABLATION  08/21/2021   IR ABLATE LIVER CRYOABLATION  08/21/2021   IR ABLATE LIVER CRYOABLATION  08/21/2021   IR ABLATE LIVER CRYOABLATION  09/17/2021   IR ABLATE LIVER CRYOABLATION  09/17/2021   IR ABLATE LIVER CRYOABLATION  09/17/2021   IR ABLATE LIVER CRYOABLATION  09/17/2021   IR RADIOLOGIST EVAL & MGMT  08/04/2021   IR RADIOLOGIST EVAL & MGMT  09/11/2021   IR RADIOLOGIST EVAL & MGMT  10/13/2021   LAPAROSCOPIC GASTRIC BANDING     SUPRACERVICAL ABDOMINAL HYSTERECTOMY  2017   Patient Active Problem List   Diagnosis Date Noted   Special screening for malignant neoplasms, colon 03/02/2022   Hyperlipidemia associated with type 2 diabetes mellitus ([place]) 09/29/2021   Vaginal septum 09/16/2021   Vitreomacular adhesion of both eyes 09/11/2021   Moderate persistent asthma with exacerbation 07/11/2021   HTN (hypertension), benign 07/11/2021    Type 2 diabetes mellitus with hyperglycemia, without long-term current use of insulin ([place]) 07/02/2021   Morbid obesity ([place]) 07/01/2021   Pain in joint involving multiple sites 03/04/2021   History of colon polyps 03/04/2021   OSA (obstructive sleep apnea) 06/13/2020   GAD (generalized anxiety disorder) 04/10/2020   Memory difficulty 04/10/2020   Asthma 04/09/2020   S/P abdominal supracervical subtotal hysterectomy 04/09/2020   Insomnia 04/09/2020    PCP: Flossie Buffy NP   REFERRING PROVIDER: Melrose Nakayama, MD  REFERRING DIAG: M25.561 (ICD-10-CM) - Pain in right knee  THERAPY DIAG:  Difficulty walking  Muscle weakness (generalized)  Stiffness of left knee, not elsewhere classified  Stiffness of right knee, not elsewhere classified  Rationale for Evaluation and Treatment: Rehabilitation  ONSET DATE: 03/17/2022  SUBJECTIVE STATEMENT:  I feel I am a little stronger but I don't keep up on my end like I should. I have seen a decline in my ROM in the last 2 weeks. I have lost about 50lb but still need to lose about 35-40 more to qualify for surgery. Would like to do the left first. If I go ahead and join in May, I can come 3days/week.   PERTINENT HISTORY:  PAIN:  Are you having pain? Yes: NPRS scale: 2-3/10  Pain location: B knees  Pain description: constant ache Aggravating factors: WB  Relieving factors: heat, stretches, medicines   PRECAUTIONS: None  WEIGHT BEARING RESTRICTIONS: No  FALLS:  Has patient fallen in last 6 months? 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  PLOF: Independent, Independent with basic ADLs, Independent with gait, and Independent with transfers  PATIENT GOALS: get ready for surgery, find a way to be able to  exercise more to lose weight for surgery, general prehab- goal of surgery by Nov/Dec   OBJECTIVE:   DIAGNOSTIC FINDINGS:   PATIENT SURVEYS:  FOTO will do 2nd session   03/25/22: 40% with goal of 55%  05/04/22: 29%  3/28: 44  LOWER EXTREMITY ROM:  Active ROM Right eval Left eval Right / Left 04/16/22 Right / Left 2/12 Rt/Lt 3/28  Hip flexion       Hip extension       Hip abduction       Hip adduction       Hip internal rotation       Hip external rotation       Knee flexion 110* 106* 115 / 110 119/116 110/110  Knee extension 9* 12* 0 / -8 0/-5 -9/-18  Ankle dorsiflexion       Ankle plantarflexion       Ankle inversion       Ankle eversion        (Blank rows = not tested)  LOWER EXTREMITY MMT:  MMT Right eval Left eval Right / Left 04/16/22 Right / Left 2/12 Rt/Lt 3/28  Hip flexion 3 3 4- 4/4 36.3/26.3  Hip extension       Hip abduction 3 3 4- 4+/4+ SL above knee 27.8/20.6  Hip adduction       Hip internal rotation       Hip external rotation       Knee flexion 3- 3-  3+ 4+/4+   Knee extension 4- 4- 4+ 4+/4+ 50.7 lb/ 48.1  Ankle dorsiflexion       Ankle plantarflexion       Ankle inversion       Ankle eversion        (Blank rows = not tested)  GAIT: Distance walked:85ft Assistive device utilized: Single point cane Level of assistance: Modified independence Comments:antalgic   TODAY'S TREATMENT:                                                                                                                              DATE:   Treatment                            06/18/22:  See plan   2/23 Nu-step 5 min  Quad set 2x15 SAQ 2x10 SLR: 2x10  Seated clamshell 2x15 red  Standing heel raise 2x15  Bridge 2x10 Reviewed and updated HEP  Standing march x20   Reviewed patella mobilization. Reviewed self soft tissue mobilization of both knees.     05/13/22:  Pt seen for aquatic therapy today.  Treatment took place in water 3.25-4.5 ft in depth at the  [place]. Temp of water was 91.  Pt entered/exited the pool via stairs independently with bilateral rail. - without support: walking forward/backward and side stepping multiple widths - holding wall:  heel raises x 10, hip abdct/ addct;   - holding yellow hand floats: leg swings into hip flex/ext x 10 each; mini squats x 10; 3 way tap; -side lunges with shoulder addct x 4 widths - ue support yellow hand buoys: single leg noodle press (solid noodle) x 12 each LE hip in neutral then externally rotated - STS from 3rd step (bottom) 10x.  Gains immediate standing balance 10/10 x with occasional unsteadiness.   Pt requires the buoyancy and hydrostatic pressure of water for support, and to offload joints by unweighting joint load by at least 50 % in navel deep water and by at least 75-80% in chest to neck deep water.  Viscosity of the water is needed for resistance of strengthening. Water current perturbations provides challenge to standing balance requiring increased core activation.    PATIENT EDUCATION:  Education details: aquatics modifications and progressions   Person educated: Patient Education method: Customer service manager Education comprehension: verbalized understanding and returned demonstration  HOME EXERCISE PROGRAM: Access Code: [email or web address] URL: [email or web address]     ASSESSMENT:  CLINICAL IMPRESSION: Some loss in ROM noted today and utilized dynamometry for more precise strength measures. Asked her to include glut sets into HEP, otherwise time taken to discussed exerises throughout her day as she finds it difficult to carve out a larger chunk of time at the end of the day when she gets home late from work. Also dicussed aquatic to land exercises and rationale for POC build. PT verbalized understanding and will continue toward functional goals with ultimately working to qualify for surgery at the end of the year.   Initial  Assessment: Patient is a 52 y.o. F who was seen today for physical therapy evaluation and treatment for B knee pain. Exam is as typical and expected given history and current complaints. Will benefit from skilled PT services to address functional impairments, reduce pain, and assist in preparation for surgery.   OBJECTIVE IMPAIRMENTS: Abnormal gait, difficulty walking, decreased ROM, decreased strength, increased edema, impaired flexibility, obesity, and pain.   ACTIVITY LIMITATIONS: squatting, stairs, transfers, and locomotion level  PARTICIPATION LIMITATIONS: community activity, occupation, yard work, and church  PERSONAL FACTORS: Age, Behavior pattern, and Fitness are also affecting patient's functional outcome.   REHAB POTENTIAL: Excellent  CLINICAL DECISION MAKING: Stable/uncomplicated  EVALUATION COMPLEXITY: Low   GOALS: Goals reviewed with patient? Yes  SHORT TERM GOALS: Target date: 04/17/2022   Will be compliant with appropriate progressive HEP  Baseline: Goal status: ongoing 04/16/22  2.  Pain to be no more than 7/10 at worst  Baseline:  Goal status: Met 04/16/22  3.  B knee extension AROM to be no more than -5 degrees and flexion AROM to be no less than 115 degrees  Baseline:  Goal status: Partially met (RLE not LLE)   LONG TERM GOALS: Target date: 05/29/2022    MMT to be at least 4/5 in all tested groups  Baseline:  Goal status:achieved  2.  Pain to be no more than 5/10 at worst  Baseline: not as often, mostly rainy/cold days and AM soreness Goal status:ongoing  3.  Will be compliant in appropriate progressive prehab gym program (land or water) on an independent basis to assist in prehab prior to surgery and in weight loss efforts  Baseline:  Goal status:ongoing 4. ROM 0-120 bil  Goal status: NEW 5. Continued weight loss to qualify for surgery  Goal status: NEW    PLAN:  PT FREQUENCY: 1-2x/week  PT DURATION: 8 weeks  PLANNED INTERVENTIONS:  Therapeutic exercises, Therapeutic activity, Neuromuscular re-education, Balance training, Gait training, Patient/Family education, Self Care, Joint mobilization, Stair training, Orthotic/Fit training, DME instructions, Aquatic Therapy, Dry Needling, Electrical stimulation, Spinal mobilization, Cryotherapy, Moist heat, Taping, Ultrasound, Ionotophoresis 4mg /ml Dexamethasone, Manual therapy, and Re-evaluation  PLAN FOR NEXT SESSION: strength and ROM, general prehab program, core strength   Rayjon Wery C. Aaronjames Kelsay PT, DPT 06/18/22 10:23 AM  [facility] [place], [place], [address] Phone: [phone]   Fax:  [phone]

## 2022-06-17 NOTE — Telephone Encounter (Signed)
Caller Name: Larra Call back phone #: [phone]  Reason for Call: Pt had labs yesterday 3/26 and saw results in mychart and would like a call to discuss the results.

## 2022-06-18 ENCOUNTER — Ambulatory Visit (HOSPITAL_BASED_OUTPATIENT_CLINIC_OR_DEPARTMENT_OTHER): Payer: BC Managed Care – PPO | Attending: Orthopaedic Surgery | Admitting: Physical Therapy

## 2022-06-18 ENCOUNTER — Encounter (HOSPITAL_BASED_OUTPATIENT_CLINIC_OR_DEPARTMENT_OTHER): Payer: Self-pay | Admitting: Physical Therapy

## 2022-06-18 DIAGNOSIS — M6281 Muscle weakness (generalized): Secondary | ICD-10-CM | POA: Diagnosis not present

## 2022-06-18 DIAGNOSIS — R262 Difficulty in walking, not elsewhere classified: Secondary | ICD-10-CM

## 2022-06-18 DIAGNOSIS — M25661 Stiffness of right knee, not elsewhere classified: Secondary | ICD-10-CM

## 2022-06-18 DIAGNOSIS — M25662 Stiffness of left knee, not elsewhere classified: Secondary | ICD-10-CM | POA: Diagnosis not present

## 2022-06-18 NOTE — Addendum Note (Signed)
Addended by: Leana Gamer on: 06/18/2022 02:47 PM   Modules accepted: Orders

## 2022-06-23 ENCOUNTER — Ambulatory Visit (HOSPITAL_BASED_OUTPATIENT_CLINIC_OR_DEPARTMENT_OTHER): Payer: BC Managed Care – PPO | Attending: Orthopaedic Surgery | Admitting: Physical Therapy

## 2022-06-23 ENCOUNTER — Encounter (HOSPITAL_BASED_OUTPATIENT_CLINIC_OR_DEPARTMENT_OTHER): Payer: Self-pay | Admitting: Physical Therapy

## 2022-06-23 DIAGNOSIS — R262 Difficulty in walking, not elsewhere classified: Secondary | ICD-10-CM | POA: Diagnosis not present

## 2022-06-23 DIAGNOSIS — M6281 Muscle weakness (generalized): Secondary | ICD-10-CM

## 2022-06-23 DIAGNOSIS — M25662 Stiffness of left knee, not elsewhere classified: Secondary | ICD-10-CM | POA: Insufficient documentation

## 2022-06-23 DIAGNOSIS — M25661 Stiffness of right knee, not elsewhere classified: Secondary | ICD-10-CM | POA: Insufficient documentation

## 2022-06-23 NOTE — Therapy (Signed)
OUTPATIENT PHYSICAL THERAPY LOWER EXTREMITY TREATMENT   Patient Name: Veronica [name] MRN: [id number] DOB:February 28, 1971, 52 y.o., female Today's Date: 06/23/2022  END OF SESSION:  PT End of Session - 06/23/22 1533     Visit Number 15    Date for PT Re-Evaluation 10/21/22    PT Start Time 1531    PT Stop Time 1610    PT Time Calculation (min) 39 min    Behavior During Therapy [facility] for tasks assessed/performed                  Past Medical History:  Diagnosis Date   Anxiety    Arthritis    Asthma    DM (diabetes mellitus)    GERD (gastroesophageal reflux disease)    HTN (hypertension)    Obesity    Sleep apnea    Vaginal bleeding 05/12/2021   Past Surgical History:  Procedure Laterality Date   CESAREAN SECTION     x 3   CHOLECYSTECTOMY     COLONOSCOPY WITH PROPOFOL N/A 03/02/2022   Procedure: COLONOSCOPY WITH PROPOFOL;  Surgeon: Thornton Park, MD;  Location: [facility];  Service: Gastroenterology;  Laterality: N/A;   IR ABLATE LIVER CRYOABLATION  08/21/2021   IR ABLATE LIVER CRYOABLATION  08/21/2021   IR ABLATE LIVER CRYOABLATION  08/21/2021   IR ABLATE LIVER CRYOABLATION  09/17/2021   IR ABLATE LIVER CRYOABLATION  09/17/2021   IR ABLATE LIVER CRYOABLATION  09/17/2021   IR ABLATE LIVER CRYOABLATION  09/17/2021   IR RADIOLOGIST EVAL & MGMT  08/04/2021   IR RADIOLOGIST EVAL & MGMT  09/11/2021   IR RADIOLOGIST EVAL & MGMT  10/13/2021   LAPAROSCOPIC GASTRIC BANDING     SUPRACERVICAL ABDOMINAL HYSTERECTOMY  2017   Patient Active Problem List   Diagnosis Date Noted   Special screening for malignant neoplasms, colon 03/02/2022   Hyperlipidemia associated with type 2 diabetes mellitus 09/29/2021   Vaginal septum 09/16/2021   Vitreomacular adhesion of both eyes 09/11/2021   Moderate persistent asthma with exacerbation 07/11/2021   HTN (hypertension), benign 07/11/2021   Type 2 diabetes mellitus with hyperglycemia, without long-term current use of insulin  07/02/2021   Morbid obesity 07/01/2021   Pain in joint involving multiple sites 03/04/2021   History of colon polyps 03/04/2021   OSA (obstructive sleep apnea) 06/13/2020   GAD (generalized anxiety disorder) 04/10/2020   Memory difficulty 04/10/2020   Asthma 04/09/2020   S/P abdominal supracervical subtotal hysterectomy 04/09/2020   Insomnia 04/09/2020    PCP: Flossie Buffy NP   REFERRING PROVIDER: Melrose Nakayama, MD  REFERRING DIAG: M25.561 (ICD-10-CM) - Pain in right knee  THERAPY DIAG:  Difficulty walking  Muscle weakness (generalized)  Stiffness of left knee, not elsewhere classified  Stiffness of right knee, not elsewhere classified  Rationale for Evaluation and Treatment: Rehabilitation  ONSET DATE: 03/17/2022  SUBJECTIVE STATEMENT: Pt reports she has missed 3 wks of treatment due to allergies/ schedule conflicts.     PERTINENT HISTORY:  PAIN:  Are you having pain? Yes: NPRS scale: 3/10  Pain location: B anterior knees  Pain description: constant ache Aggravating factors: WB  Relieving factors: heat, stretches, medicines   PRECAUTIONS: None  WEIGHT BEARING RESTRICTIONS: No  FALLS:  Has patient fallen in last 6 months? 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  PLOF: Independent, Independent with basic ADLs, Independent with gait, and Independent with transfers  PATIENT GOALS: get ready for surgery, find a way to be able to exercise more to lose weight for surgery, general prehab- goal of surgery by Nov/Dec   OBJECTIVE:   DIAGNOSTIC FINDINGS:   PATIENT SURVEYS:  FOTO will do 2nd session   03/25/22: 40% with goal of 55%  05/04/22: 29%  3/28: 44  LOWER EXTREMITY ROM:  Active ROM Right eval Left eval Right /  Left 04/16/22 Right / Left 2/12 Rt/Lt 3/28  Hip flexion       Hip extension       Hip abduction       Hip adduction       Hip internal rotation       Hip external rotation       Knee flexion 110* 106* 115 / 110 119/116 110/110  Knee extension 9* 12* 0 / -8 0/-5 -9/-18  Ankle dorsiflexion       Ankle plantarflexion       Ankle inversion       Ankle eversion        (Blank rows = not tested)  LOWER EXTREMITY MMT:  MMT Right eval Left eval Right / Left 04/16/22 Right / Left 2/12 Rt/Lt 3/28  Hip flexion 3 3 4- 4/4 36.3/26.3  Hip extension       Hip abduction 3 3 4- 4+/4+ SL above knee 27.8/20.6  Hip adduction       Hip internal rotation       Hip external rotation       Knee flexion 3- 3-  3+ 4+/4+   Knee extension 4- 4- 4+ 4+/4+ 50.7 lb/ 48.1  Ankle dorsiflexion       Ankle plantarflexion       Ankle inversion       Ankle eversion        (Blank rows = not tested)  GAIT: Distance walked:2ft Assistive device utilized: Single point cane Level of assistance: Modified independence Comments:antalgic   TODAY'S TREATMENT:                                                                                                                              DATE:  06/23/22:  Pt seen for aquatic therapy today.  Treatment took place in water 3.25-4.5 ft in depth at the [facility]. Temp of water was 91.  Pt entered/exited the pool via stairs independently with step-to pattern and bilateral rail.  - without support: walking forward/backward multiple widths - side stepping with arm addct/ abdct with plain arms, then with rainbow, then with yellow (3 laps total) - lightly holding wall: single leg noodle press (solid noodle) x 10 slow, x 10 fast- each LE  - holding yellow hand floats:  leg swings into hip flex/ext x 10 each; heel raises x 10 - walking forward/ backward  with single/ double yellow hand float - holding wall: 1/2 diamonds x 10 each  -  STS from 4th step  10x -  light jog forward/ backward  - R/L hamstring stretch with foot on 2nd step, cues for alignment  Pt requires the buoyancy and hydrostatic pressure of water for support, and to offload joints by unweighting joint load by at least 50 % in navel deep water and by at least 75-80% in chest to neck deep water.  Viscosity of the water is needed for resistance of strengthening. Water current perturbations provides challenge to standing balance requiring increased core activation.  Treatment                            06/18/22:  See plan   2/23 Nu-step 5 min  Quad set 2x15 SAQ 2x10 SLR: 2x10  Seated clamshell 2x15 red  Standing heel raise 2x15  Bridge 2x10 Reviewed and updated HEP  Standing march x20   Reviewed patella mobilization. Reviewed self soft tissue mobilization of both knees.    PATIENT EDUCATION:  Education details: aquatics modifications and progressions   Person educated: Patient Education method: Customer service manager Education comprehension: verbalized understanding and returned demonstration  HOME EXERCISE PROGRAM: Access Code: [email or web address] URL: [email or web address]     ASSESSMENT:  CLINICAL IMPRESSION: Pt tolerated exercises in water with only a slight increase in Lt hip discomfort after standing noodle press.   Encouraged self care to decrease post-exercise soreness in days to follow. Pt would benefit from continued skilled therapy in order to reach goals and maximize functional bilat LE strength and ROM for prevention of further functional decline. Pt doesn't care for cycling while suspended; held this activity. Will begin creating aquatic HEP.      OBJECTIVE IMPAIRMENTS: Abnormal gait, difficulty walking, decreased ROM, decreased strength, increased edema, impaired flexibility, obesity, and pain.   ACTIVITY LIMITATIONS: squatting, stairs, transfers, and locomotion level  PARTICIPATION LIMITATIONS: community activity, occupation, yard work,  and church  PERSONAL FACTORS: Age, Behavior pattern, and Fitness are also affecting patient's functional outcome.   REHAB POTENTIAL: Excellent  CLINICAL DECISION MAKING: Stable/uncomplicated  EVALUATION COMPLEXITY: Low   GOALS: Goals reviewed with patient? Yes  SHORT TERM GOALS: Target date: 04/17/2022   Will be compliant with appropriate progressive HEP  Baseline: Goal status: ongoing 04/16/22  2.  Pain to be no more than 7/10 at worst  Baseline:  Goal status: Met 04/16/22  3.  B knee extension AROM to be no more than -5 degrees and flexion AROM to be no less than 115 degrees  Baseline:  Goal status: Partially met (RLE not LLE)   LONG TERM GOALS: Target date: 10/21/22    MMT to be at least 4/5 in all tested groups  Baseline:  Goal status:achieved  2.  Pain to be no more than 5/10 at worst  Baseline: not as often, mostly rainy/cold days and AM soreness Goal status:ongoing  3.  Will be compliant in appropriate progressive prehab gym program (land or water) on an independent basis to assist in prehab prior to surgery and in weight loss efforts  Baseline:  Goal status:ongoing 4. ROM 0-120 bil  Goal status: NEW 5. Continued weight loss to qualify for surgery  Goal status: NEW    PLAN:  PT FREQUENCY: 1-2x/week  PT DURATION: 8 weeks  PLANNED INTERVENTIONS: Therapeutic exercises, Therapeutic activity, Neuromuscular re-education, Balance training, Gait training, Patient/Family education, Self Care, Joint mobilization, Stair training, Orthotic/Fit  training, DME instructions, Aquatic Therapy, Dry Needling, Electrical stimulation, Spinal mobilization, Cryotherapy, Moist heat, Taping, Ultrasound, Ionotophoresis 4mg /ml Dexamethasone, Manual therapy, and Re-evaluation  PLAN FOR NEXT SESSION: strength and ROM, general prehab program, core strength   Kerin Perna, PTA 06/23/22 4:14 PM [facility] [address] [place], [place], [address] Phone: [phone]   Fax:  [phone]

## 2022-07-10 ENCOUNTER — Other Ambulatory Visit (INDEPENDENT_AMBULATORY_CARE_PROVIDER_SITE_OTHER): Payer: BC Managed Care – PPO

## 2022-07-10 DIAGNOSIS — R748 Abnormal levels of other serum enzymes: Secondary | ICD-10-CM

## 2022-07-10 LAB — HEPATIC FUNCTION PANEL
ALT: 12 U/L (ref 0–35)
AST: 15 U/L (ref 0–37)
Albumin: 3.8 g/dL (ref 3.5–5.2)
Alkaline Phosphatase: 102 U/L (ref 39–117)
Bilirubin, Direct: 0.1 mg/dL (ref 0.0–0.3)
Total Bilirubin: 0.5 mg/dL (ref 0.2–1.2)
Total Protein: 6.9 g/dL (ref 6.0–8.3)

## 2022-07-10 NOTE — Progress Notes (Signed)
Stable Follow instructions as discussed during office visit.

## 2022-07-15 ENCOUNTER — Ambulatory Visit (HOSPITAL_BASED_OUTPATIENT_CLINIC_OR_DEPARTMENT_OTHER): Payer: BC Managed Care – PPO | Admitting: Physical Therapy

## 2022-07-15 ENCOUNTER — Encounter (HOSPITAL_BASED_OUTPATIENT_CLINIC_OR_DEPARTMENT_OTHER): Payer: Self-pay | Admitting: Physical Therapy

## 2022-07-15 DIAGNOSIS — M25662 Stiffness of left knee, not elsewhere classified: Secondary | ICD-10-CM | POA: Diagnosis not present

## 2022-07-15 DIAGNOSIS — M25661 Stiffness of right knee, not elsewhere classified: Secondary | ICD-10-CM | POA: Diagnosis not present

## 2022-07-15 DIAGNOSIS — M6281 Muscle weakness (generalized): Secondary | ICD-10-CM | POA: Diagnosis not present

## 2022-07-15 DIAGNOSIS — R262 Difficulty in walking, not elsewhere classified: Secondary | ICD-10-CM | POA: Diagnosis not present

## 2022-07-15 NOTE — Therapy (Signed)
OUTPATIENT PHYSICAL THERAPY LOWER EXTREMITY TREATMENT   Patient Name: Veronica J EMRIE [id number] DOB:[birth year], 52 y.o., female Today's Date: 07/15/2022  END OF SESSION:  PT End of Session - 07/15/22 1708     Visit Number 16    Date for PT Re-Evaluation 10/21/22    PT Start Time 1700    PT Stop Time 1742    PT Time Calculation (min) 42 min    Activity Tolerance Patient tolerated treatment well    Behavior During Therapy [facility] for tasks assessed/performed                  Past Medical History:  Diagnosis Date   Anxiety    Arthritis    Asthma    DM (diabetes mellitus)    GERD (gastroesophageal reflux disease)    HTN (hypertension)    Obesity    Sleep apnea    Vaginal bleeding 05/12/2021   Past Surgical History:  Procedure Laterality Date   CESAREAN SECTION     x 3   CHOLECYSTECTOMY     COLONOSCOPY WITH PROPOFOL N/A 03/02/2022   Procedure: COLONOSCOPY WITH PROPOFOL;  Surgeon: Tressia Danas, MD;  Location: [facility];  Service: Gastroenterology;  Laterality: N/A;   IR ABLATE LIVER CRYOABLATION  08/21/2021   IR ABLATE LIVER CRYOABLATION  08/21/2021   IR ABLATE LIVER CRYOABLATION  08/21/2021   IR ABLATE LIVER CRYOABLATION  09/17/2021   IR ABLATE LIVER CRYOABLATION  09/17/2021   IR ABLATE LIVER CRYOABLATION  09/17/2021   IR ABLATE LIVER CRYOABLATION  09/17/2021   IR RADIOLOGIST EVAL & MGMT  08/04/2021   IR RADIOLOGIST EVAL & MGMT  09/11/2021   IR RADIOLOGIST EVAL & MGMT  10/13/2021   LAPAROSCOPIC GASTRIC BANDING     SUPRACERVICAL ABDOMINAL HYSTERECTOMY  2017   Patient Active Problem List   Diagnosis Date Noted   Special screening for malignant neoplasms, colon 03/02/2022   Hyperlipidemia associated with type 2 diabetes mellitus 09/29/2021   Vaginal septum 09/16/2021   Vitreomacular adhesion of both eyes 09/11/2021   Moderate persistent asthma with exacerbation 07/11/2021   HTN (hypertension), benign 07/11/2021   Type 2 diabetes mellitus with  hyperglycemia, without long-term current use of insulin 07/02/2021   Morbid obesity 07/01/2021   Pain in joint involving multiple sites 03/04/2021   History of colon polyps 03/04/2021   OSA (obstructive sleep apnea) 06/13/2020   GAD (generalized anxiety disorder) 04/10/2020   Memory difficulty 04/10/2020   Asthma 04/09/2020   S/P abdominal supracervical subtotal hysterectomy 04/09/2020   Insomnia 04/09/2020    PCP: Anne Ng NP   REFERRING PROVIDER: Marcene Corning, MD  REFERRING DIAG: M25.561 (ICD-10-CM) - Pain in right knee  THERAPY DIAG:  Difficulty walking  Muscle weakness (generalized)  Stiffness of left knee, not elsewhere classified  Rationale for Evaluation and Treatment: Rehabilitation  ONSET DATE: 03/17/2022  SUBJECTIVE STATEMENT: Pt reports having a big baby shower for her DIL, on her feet for many day preparing caused increase in knee pain for ~ 10 days.  Yesterday was first day of relief 3/10.  PERTINENT HISTORY:  PAIN:  Are you having pain? Yes: NPRS scale: 3/10  Pain location: B anterior knees  Pain description: constant ache Aggravating factors: WB  Relieving factors: heat, stretches, medicines   PRECAUTIONS: None  WEIGHT BEARING RESTRICTIONS: No  FALLS:  Has patient fallen in last 6 months? 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  PLOF: Independent, Independent with basic ADLs, Independent with gait, and Independent with transfers  PATIENT GOALS: get ready for surgery, find a way to be able to exercise more to lose weight for surgery, general prehab- goal of surgery by Nov/Dec   OBJECTIVE:   DIAGNOSTIC FINDINGS:   PATIENT SURVEYS:  FOTO will do 2nd session   03/25/22: 40% with goal of 55%  05/04/22: 29%   3/28: 44  LOWER EXTREMITY ROM:  Active ROM Right eval Left eval Right / Left 04/16/22 Right / Left 2/12 Rt/Lt 3/28  Hip flexion       Hip extension       Hip abduction       Hip adduction       Hip internal rotation       Hip external rotation       Knee flexion 110* 106* 115 / 110 119/116 110/110  Knee extension 9* 12* 0 / -8 0/-5 -9/-18  Ankle dorsiflexion       Ankle plantarflexion       Ankle inversion       Ankle eversion        (Blank rows = not tested)  LOWER EXTREMITY MMT:  MMT Right eval Left eval Right / Left 04/16/22 Right / Left 2/12 Rt/Lt 3/28  Hip flexion 3 3 4- 4/4 36.3/26.3  Hip extension       Hip abduction 3 3 4- 4+/4+ SL above knee 27.8/20.6  Hip adduction       Hip internal rotation       Hip external rotation       Knee flexion 3- 3-  3+ 4+/4+   Knee extension 4- 4- 4+ 4+/4+ 50.7 lb/ 48.1  Ankle dorsiflexion       Ankle plantarflexion       Ankle inversion       Ankle eversion        (Blank rows = not tested)  GAIT: Distance walked:26ft Assistive device utilized: Single point cane Level of assistance: Modified independence Comments:antalgic   TODAY'S TREATMENT:                                                                                                                              DATE:  07/15/22:  Pt seen for aquatic therapy today.  Treatment took place in water 3.25-4.5 ft in depth at the [facility] pool. Temp of water was 91.  Pt entered/exited the pool via stairs independently with step-to pattern and bilateral rail.  - without support: walking forward/backward multiple widths - side stepping with arm addct/ abdct  -Step ups leading R/L x 10 -Seated on lift: cycling; hip add/abd; LAQ x 20 - holding yellow hand floats:  leg swings into hip flex/ext x 10 each; heel raises x 10; toe  raises x 10; mini squats x 10 - ue support yellow HB: single leg noodle press (solid noodle) x 10 slow, x 10 fast- lle SLS right -Static  Balance challenge on solid noodle: heels on ground with difficulty maintaining static position best 5s, toes x>15s   - directly on noodle best 4s with ue sculling  Pt requires the buoyancy and hydrostatic pressure of water for support, and to offload joints by unweighting joint load by at least 50 % in navel deep water and by at least 75-80% in chest to neck deep water.  Viscosity of the water is needed for resistance of strengthening. Water current perturbations provides challenge to standing balance requiring increased core activation.  Treatment                            06/18/22:  See plan   2/23 Nu-step 5 min  Quad set 2x15 SAQ 2x10 SLR: 2x10  Seated clamshell 2x15 red  Standing heel raise 2x15  Bridge 2x10 Reviewed and updated HEP  Standing march x20   Reviewed patella mobilization. Reviewed self soft tissue mobilization of both knees.    PATIENT EDUCATION:  Education details: aquatics modifications and progressions   Person educated: Patient Education method: Medical illustrator Education comprehension: verbalized understanding and returned demonstration  HOME EXERCISE PROGRAM: Access Code: [email or web address] URL: [email or web address]     ASSESSMENT:  CLINICAL IMPRESSION: Return to aquatic therapy after ~ 3 weeks due to scheduling conflicts.  Pt reports reduction in pain last day or so. She tolerates progressed balance challenges with proprioception retraining through knees. Slight increase in right knee pain upon completion 1-2 points.  Goals ongoing.    OBJECTIVE IMPAIRMENTS: Abnormal gait, difficulty walking, decreased ROM, decreased strength, increased edema, impaired flexibility, obesity, and pain.   ACTIVITY LIMITATIONS: squatting, stairs, transfers, and locomotion level  PARTICIPATION LIMITATIONS: community activity, occupation, yard work, and church  PERSONAL FACTORS: Age, Behavior pattern, and Fitness are also affecting patient's  functional outcome.   REHAB POTENTIAL: Excellent  CLINICAL DECISION MAKING: Stable/uncomplicated  EVALUATION COMPLEXITY: Low   GOALS: Goals reviewed with patient? Yes  SHORT TERM GOALS: Target date: 04/17/2022   Will be compliant with appropriate progressive HEP  Baseline: Goal status: ongoing 04/16/22  2.  Pain to be no more than 7/10 at worst  Baseline:  Goal status: Met 04/16/22  3.  B knee extension AROM to be no more than -5 degrees and flexion AROM to be no less than 115 degrees  Baseline:  Goal status: Partially met (RLE not LLE)   LONG TERM GOALS: Target date: 10/21/22    MMT to be at least 4/5 in all tested groups  Baseline:  Goal status:achieved  2.  Pain to be no more than 5/10 at worst  Baseline: not as often, mostly rainy/cold days and AM soreness Goal status:ongoing  3.  Will be compliant in appropriate progressive prehab gym program (land or water) on an independent basis to assist in prehab prior to surgery and in weight loss efforts  Baseline:  Goal status:ongoing 4. ROM 0-120 bil  Goal status: NEW 5. Continued weight loss to qualify for surgery  Goal status: NEW    PLAN:  PT FREQUENCY: 1-2x/week  PT DURATION: 8 weeks  PLANNED INTERVENTIONS: Therapeutic exercises, Therapeutic activity, Neuromuscular re-education, Balance training, Gait training, Patient/Family education, Self Care, Joint mobilization, Stair training, Orthotic/Fit training, DME instructions, Aquatic Therapy, Dry Needling, Electrical stimulation, Spinal mobilization, Cryotherapy, Moist heat,  Taping, Ultrasound, Ionotophoresis 4mg /ml Dexamethasone, Manual therapy, and Re-evaluation  PLAN FOR NEXT SESSION: strength and ROM, general prehab program, core strength   Rushie Chestnut) Shakiah Wester MPT 07/15/22 5:24 PM [facility] [address] [place], [place], [address] Phone: [phone]   Fax:  [phone]

## 2022-07-16 ENCOUNTER — Ambulatory Visit (HOSPITAL_BASED_OUTPATIENT_CLINIC_OR_DEPARTMENT_OTHER): Payer: BC Managed Care – PPO | Admitting: Physical Therapy

## 2022-07-21 ENCOUNTER — Ambulatory Visit (HOSPITAL_BASED_OUTPATIENT_CLINIC_OR_DEPARTMENT_OTHER): Payer: BC Managed Care – PPO | Admitting: Physical Therapy

## 2022-07-21 ENCOUNTER — Encounter (HOSPITAL_BASED_OUTPATIENT_CLINIC_OR_DEPARTMENT_OTHER): Payer: Self-pay | Admitting: Physical Therapy

## 2022-07-21 DIAGNOSIS — M25661 Stiffness of right knee, not elsewhere classified: Secondary | ICD-10-CM | POA: Diagnosis not present

## 2022-07-21 DIAGNOSIS — R262 Difficulty in walking, not elsewhere classified: Secondary | ICD-10-CM

## 2022-07-21 DIAGNOSIS — M6281 Muscle weakness (generalized): Secondary | ICD-10-CM

## 2022-07-21 DIAGNOSIS — M25662 Stiffness of left knee, not elsewhere classified: Secondary | ICD-10-CM

## 2022-07-21 NOTE — Therapy (Signed)
OUTPATIENT PHYSICAL THERAPY LOWER EXTREMITY TREATMENT   Patient Name: Veronica [name] MRN: [id number] DOB:1970-06-22, 52 y.o., female Today's Date: 07/21/2022  END OF SESSION:  PT End of Session - 07/21/22 1619     Visit Number 17    Date for PT Re-Evaluation 10/21/22    PT Start Time 1607    PT Stop Time 1645    PT Time Calculation (min) 38 min    Behavior During Therapy [facility] for tasks assessed/performed                  Past Medical History:  Diagnosis Date   Anxiety    Arthritis    Asthma    DM (diabetes mellitus) (HCC)    GERD (gastroesophageal reflux disease)    HTN (hypertension)    Obesity    Sleep apnea    Vaginal bleeding 05/12/2021   Past Surgical History:  Procedure Laterality Date   CESAREAN SECTION     x 3   CHOLECYSTECTOMY     COLONOSCOPY WITH PROPOFOL N/A 03/02/2022   Procedure: COLONOSCOPY WITH PROPOFOL;  Surgeon: Tressia Danas, MD;  Location: [facility];  Service: Gastroenterology;  Laterality: N/A;   IR ABLATE LIVER CRYOABLATION  08/21/2021   IR ABLATE LIVER CRYOABLATION  08/21/2021   IR ABLATE LIVER CRYOABLATION  08/21/2021   IR ABLATE LIVER CRYOABLATION  09/17/2021   IR ABLATE LIVER CRYOABLATION  09/17/2021   IR ABLATE LIVER CRYOABLATION  09/17/2021   IR ABLATE LIVER CRYOABLATION  09/17/2021   IR RADIOLOGIST EVAL & MGMT  08/04/2021   IR RADIOLOGIST EVAL & MGMT  09/11/2021   IR RADIOLOGIST EVAL & MGMT  10/13/2021   LAPAROSCOPIC GASTRIC BANDING     SUPRACERVICAL ABDOMINAL HYSTERECTOMY  2017   Patient Active Problem List   Diagnosis Date Noted   Special screening for malignant neoplasms, colon 03/02/2022   Hyperlipidemia associated with type 2 diabetes mellitus (HCC) 09/29/2021   Vaginal septum 09/16/2021   Vitreomacular adhesion of both eyes 09/11/2021   Moderate persistent asthma with exacerbation 07/11/2021   HTN (hypertension), benign 07/11/2021   Type 2 diabetes mellitus with hyperglycemia, without long-term current use of  insulin (HCC) 07/02/2021   Morbid obesity (HCC) 07/01/2021   Pain in joint involving multiple sites 03/04/2021   History of colon polyps 03/04/2021   OSA (obstructive sleep apnea) 06/13/2020   GAD (generalized anxiety disorder) 04/10/2020   Memory difficulty 04/10/2020   Asthma 04/09/2020   S/P abdominal supracervical subtotal hysterectomy 04/09/2020   Insomnia 04/09/2020    PCP: Anne Ng NP   REFERRING PROVIDER: Marcene Corning, MD  REFERRING DIAG: M25.561 (ICD-10-CM) - Pain in right knee  THERAPY DIAG:  Difficulty walking  Muscle weakness (generalized)  Stiffness of left knee, not elsewhere classified  Stiffness of right knee, not elsewhere classified  Rationale for Evaluation and Treatment: Rehabilitation  ONSET DATE: 03/17/2022  SUBJECTIVE STATEMENT: Pt reports she had pain in knees for 2 wks following hosting the baby shower.   Knees are feeling better now, finally.  She and her husband plan to join [employer], but are unsure when it will happen this month.   PERTINENT HISTORY:  PAIN:  Are you having pain? Yes: NPRS scale: 3/10  Pain location: B anterior knees  Pain description: constant ache Aggravating factors: WB  Relieving factors: heat, stretches, medicines   PRECAUTIONS: None  WEIGHT BEARING RESTRICTIONS: No  FALLS:  Has patient fallen in last 6 months? 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  PLOF: Independent, Independent with basic ADLs, Independent with gait, and Independent with transfers  PATIENT GOALS: get ready for surgery, find a way to be able to exercise more to lose weight for surgery, general prehab- goal of surgery by Nov/Dec   OBJECTIVE:   DIAGNOSTIC FINDINGS:   PATIENT SURVEYS:  FOTO will do 2nd  session   03/25/22: 40% with goal of 55%  05/04/22: 29%  3/28: 44  LOWER EXTREMITY ROM:  Active ROM Right eval Left eval Right / Left 04/16/22 Right / Left 2/12 Rt/Lt 3/28  Hip flexion       Hip extension       Hip abduction       Hip adduction       Hip internal rotation       Hip external rotation       Knee flexion 110* 106* 115 / 110 119/116 110/110  Knee extension 9* 12* 0 / -8 0/-5 -9/-18  Ankle dorsiflexion       Ankle plantarflexion       Ankle inversion       Ankle eversion        (Blank rows = not tested)  LOWER EXTREMITY MMT:  MMT Right eval Left eval Right / Left 04/16/22 Right / Left 2/12 Rt/Lt 3/28  Hip flexion 3 3 4- 4/4 36.3/26.3  Hip extension       Hip abduction 3 3 4- 4+/4+ SL above knee 27.8/20.6  Hip adduction       Hip internal rotation       Hip external rotation       Knee flexion 3- 3-  3+ 4+/4+   Knee extension 4- 4- 4+ 4+/4+ 50.7 lb/ 48.1  Ankle dorsiflexion       Ankle plantarflexion       Ankle inversion       Ankle eversion        (Blank rows = not tested)  GAIT: Distance walked:11ft Assistive device utilized: Single point cane Level of assistance: Modified independence Comments:antalgic   TODAY'S TREATMENT:                                                                                                                              DATE:  07/21/22:  Pt seen for aquatic therapy today.  Treatment took place in water 3.25-4.5 ft in depth at the [facility] pool. Temp of water was 91.  Pt entered/exited the pool via stairs independently with step-to pattern and bilateral rail.  - without support: walking forward/backward; forward walking kicks; forward marching - side stepping with arm addct/ abdct with shoulder addct with rainbow hand floats -> into side squats  - standing quad stretches with foot on blue step,  opp foot on bench behind her - 3 reps of 20s each  -Seated on bench in water: cycling; long sitting hip add/abd;  alternating LAQ with DF x 20; cycling - holding wall: mini squats x 10;  leg swings into hip flex/ext x 10 each; heel / toe raises x 10; hip abdct/ addct 2 x10 - light jog forward/ backward  - STS on 3rd step from bottom, 2 x 5 without UE support  Pt requires the buoyancy and hydrostatic pressure of water for support, and to offload joints by unweighting joint load by at least 50 % in navel deep water and by at least 75-80% in chest to neck deep water.  Viscosity of the water is needed for resistance of strengthening. Water current perturbations provides challenge to standing balance requiring increased core activation.  DATE:  07/15/22:  Pt seen for aquatic therapy today.  Treatment took place in water 3.25-4.5 ft in depth at the [facility] pool. Temp of water was 91.  Pt entered/exited the pool via stairs independently with step-to pattern and bilateral rail.  - without support: walking forward/backward multiple widths - side stepping with arm addct/ abdct  -Step ups leading R/L x 10 -Seated on lift: cycling; hip add/abd; LAQ x 20 - holding yellow hand floats:  leg swings into hip flex/ext x 10 each; heel raises x 10; toe raises x 10; mini squats x 10 - ue support yellow HB: single leg noodle press (solid noodle) x 10 slow, x 10 fast- lle SLS right -Static Balance challenge on solid noodle: heels on ground with difficulty maintaining static position best 5s, toes x>15s   - directly on noodle best 4s with ue sculling  Pt requires the buoyancy and hydrostatic pressure of water for support, and to offload joints by unweighting joint load by at least 50 % in navel deep water and by at least 75-80% in chest to neck deep water.  Viscosity of the water is needed for resistance of strengthening. Water current perturbations provides challenge to standing balance requiring increased core activation.  Treatment                            06/18/22:  See plan   2/23 Nu-step 5 min  Quad set  2x15 SAQ 2x10 SLR: 2x10  Seated clamshell 2x15 red  Standing heel raise 2x15  Bridge 2x10 Reviewed and updated HEP  Standing march x20   Reviewed patella mobilization. Reviewed self soft tissue mobilization of both knees.    PATIENT EDUCATION:  Education details: aquatics modifications and progressions   Person educated: Patient Education method: Medical illustrator Education comprehension: verbalized understanding and returned demonstration  HOME EXERCISE PROGRAM: Access Code: [email or web address] URL: [email or web address]   AQUATIC Access Code: [email or web address] URL: [email or web address] Date: 07/21/2022 Prepared by: [facility] Not issued yet -  This aquatic home exercise program from MedBridge utilizes pictures from land based exercises, but has been adapted prior to lamination and issuance.    ASSESSMENT:  CLINICAL IMPRESSION: Pt tolerated aquatic exercises well, without increase in pain - just tightness.  Began creating aquatic HEP and discussed plan for transitioning to [place] independently.   Goals ongoing.    OBJECTIVE IMPAIRMENTS: Abnormal gait, difficulty walking, decreased ROM, decreased strength, increased edema, impaired flexibility, obesity, and pain.   ACTIVITY LIMITATIONS: squatting, stairs, transfers, and locomotion level  PARTICIPATION LIMITATIONS: community activity, occupation, yard work, and church  PERSONAL FACTORS: Age,  Behavior pattern, and Fitness are also affecting patient's functional outcome.   REHAB POTENTIAL: Excellent  CLINICAL DECISION MAKING: Stable/uncomplicated  EVALUATION COMPLEXITY: Low   GOALS: Goals reviewed with patient? Yes  SHORT TERM GOALS: Target date: 04/17/2022   Will be compliant with appropriate progressive HEP  Baseline: Goal status: ongoing 04/16/22  2.  Pain to be no more than 7/10 at worst  Baseline:  Goal status: Met 04/16/22  3.  B knee extension  AROM to be no more than -5 degrees and flexion AROM to be no less than 115 degrees  Baseline:  Goal status: Partially met (RLE not LLE)   LONG TERM GOALS: Target date: 10/21/22    MMT to be at least 4/5 in all tested groups  Baseline:  Goal status:achieved  2.  Pain to be no more than 5/10 at worst  Baseline: not as often, mostly rainy/cold days and AM soreness Goal status:ongoing  3.  Will be compliant in appropriate progressive prehab gym program (land or water) on an independent basis to assist in prehab prior to surgery and in weight loss efforts  Baseline:  Goal status:ongoing 4. ROM 0-120 bil  Goal status: NEW 5. Continued weight loss to qualify for surgery  Goal status: NEW    PLAN:  PT FREQUENCY: 1-2x/week  PT DURATION: 8 weeks  PLANNED INTERVENTIONS: Therapeutic exercises, Therapeutic activity, Neuromuscular re-education, Balance training, Gait training, Patient/Family education, Self Care, Joint mobilization, Stair training, Orthotic/Fit training, DME instructions, Aquatic Therapy, Dry Needling, Electrical stimulation, Spinal mobilization, Cryotherapy, Moist heat, Taping, Ultrasound, Ionotophoresis 4mg /ml Dexamethasone, Manual therapy, and Re-evaluation  PLAN FOR NEXT SESSION: strength and ROM, general prehab program, core strength   Mayer Camel, PTA 07/21/22 4:52 PM [facility] [address] [place], [place], [address] Phone: [phone]   Fax:  [phone]

## 2022-07-23 ENCOUNTER — Encounter (HOSPITAL_BASED_OUTPATIENT_CLINIC_OR_DEPARTMENT_OTHER): Payer: Self-pay | Admitting: Physical Therapy

## 2022-07-23 ENCOUNTER — Ambulatory Visit (HOSPITAL_BASED_OUTPATIENT_CLINIC_OR_DEPARTMENT_OTHER): Payer: BC Managed Care – PPO | Attending: Orthopaedic Surgery | Admitting: Physical Therapy

## 2022-07-23 DIAGNOSIS — M6281 Muscle weakness (generalized): Secondary | ICD-10-CM | POA: Diagnosis not present

## 2022-07-23 DIAGNOSIS — R262 Difficulty in walking, not elsewhere classified: Secondary | ICD-10-CM | POA: Diagnosis not present

## 2022-07-23 DIAGNOSIS — M25661 Stiffness of right knee, not elsewhere classified: Secondary | ICD-10-CM | POA: Diagnosis not present

## 2022-07-23 DIAGNOSIS — M25662 Stiffness of left knee, not elsewhere classified: Secondary | ICD-10-CM

## 2022-07-23 NOTE — Therapy (Signed)
OUTPATIENT PHYSICAL THERAPY LOWER EXTREMITY TREATMENT   Patient Name: Veronica [name] MRN: [id number] DOB:07-14-1970, 52 y.o., female Today's Date: 07/23/2022  END OF SESSION:  PT End of Session - 07/23/22 1706     Visit Number 18    Date for PT Re-Evaluation 10/21/22    PT Start Time 1702    PT Stop Time 1745    PT Time Calculation (min) 43 min    Activity Tolerance Patient tolerated treatment well    Behavior During Therapy [facility] for tasks assessed/performed                  Past Medical History:  Diagnosis Date   Anxiety    Arthritis    Asthma    DM (diabetes mellitus) (HCC)    GERD (gastroesophageal reflux disease)    HTN (hypertension)    Obesity    Sleep apnea    Vaginal bleeding 05/12/2021   Past Surgical History:  Procedure Laterality Date   CESAREAN SECTION     x 3   CHOLECYSTECTOMY     COLONOSCOPY WITH PROPOFOL N/A 03/02/2022   Procedure: COLONOSCOPY WITH PROPOFOL;  Surgeon: Tressia Danas, MD;  Location: [facility];  Service: Gastroenterology;  Laterality: N/A;   IR ABLATE LIVER CRYOABLATION  08/21/2021   IR ABLATE LIVER CRYOABLATION  08/21/2021   IR ABLATE LIVER CRYOABLATION  08/21/2021   IR ABLATE LIVER CRYOABLATION  09/17/2021   IR ABLATE LIVER CRYOABLATION  09/17/2021   IR ABLATE LIVER CRYOABLATION  09/17/2021   IR ABLATE LIVER CRYOABLATION  09/17/2021   IR RADIOLOGIST EVAL & MGMT  08/04/2021   IR RADIOLOGIST EVAL & MGMT  09/11/2021   IR RADIOLOGIST EVAL & MGMT  10/13/2021   LAPAROSCOPIC GASTRIC BANDING     SUPRACERVICAL ABDOMINAL HYSTERECTOMY  2017   Patient Active Problem List   Diagnosis Date Noted   Special screening for malignant neoplasms, colon 03/02/2022   Hyperlipidemia associated with type 2 diabetes mellitus (HCC) 09/29/2021   Vaginal septum 09/16/2021   Vitreomacular adhesion of both eyes 09/11/2021   Moderate persistent asthma with exacerbation 07/11/2021   HTN (hypertension), benign 07/11/2021   Type 2 diabetes mellitus  with hyperglycemia, without long-term current use of insulin (HCC) 07/02/2021   Morbid obesity (HCC) 07/01/2021   Pain in joint involving multiple sites 03/04/2021   History of colon polyps 03/04/2021   OSA (obstructive sleep apnea) 06/13/2020   GAD (generalized anxiety disorder) 04/10/2020   Memory difficulty 04/10/2020   Asthma 04/09/2020   S/P abdominal supracervical subtotal hysterectomy 04/09/2020   Insomnia 04/09/2020    PCP: Anne Ng NP   REFERRING PROVIDER: Marcene Corning, MD  REFERRING DIAG: M25.561 (ICD-10-CM) - Pain in right knee  THERAPY DIAG:  Difficulty walking  Muscle weakness (generalized)  Stiffness of left knee, not elsewhere classified  Stiffness of right knee, not elsewhere classified  Rationale for Evaluation and Treatment: Rehabilitation  ONSET DATE: 03/17/2022  SUBJECTIVE STATEMENT: Pt reports decrease in knee pain since the Baby shower.   PERTINENT HISTORY:  PAIN:  Are you having pain? Yes: NPRS scale: 2-3/10  Pain location: B anterior knees  Pain description: constant ache Aggravating factors: WB  Relieving factors: heat, stretches, medicines   PRECAUTIONS: None  WEIGHT BEARING RESTRICTIONS: No  FALLS:  Has patient fallen in last 6 months? 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  PLOF: Independent, Independent with basic ADLs, Independent with gait, and Independent with transfers  PATIENT GOALS: get ready for surgery, find a way to be able to exercise more to lose weight for surgery, general prehab- goal of surgery by Nov/Dec   OBJECTIVE:   DIAGNOSTIC FINDINGS:   PATIENT SURVEYS:  FOTO will do 2nd session   03/25/22: 40% with goal of 55%  05/04/22: 29%  3/28: 44  LOWER EXTREMITY  ROM:  Active ROM Right eval Left eval Right / Left 04/16/22 Right / Left 2/12 Rt/Lt 3/28  Hip flexion       Hip extension       Hip abduction       Hip adduction       Hip internal rotation       Hip external rotation       Knee flexion 110* 106* 115 / 110 119/116 110/110  Knee extension 9* 12* 0 / -8 0/-5 -9/-18  Ankle dorsiflexion       Ankle plantarflexion       Ankle inversion       Ankle eversion        (Blank rows = not tested)  LOWER EXTREMITY MMT:  MMT Right eval Left eval Right / Left 04/16/22 Right / Left 2/12 Rt/Lt 3/28  Hip flexion 3 3 4- 4/4 36.3/26.3  Hip extension       Hip abduction 3 3 4- 4+/4+ SL above knee 27.8/20.6  Hip adduction       Hip internal rotation       Hip external rotation       Knee flexion 3- 3-  3+ 4+/4+   Knee extension 4- 4- 4+ 4+/4+ 50.7 lb/ 48.1  Ankle dorsiflexion       Ankle plantarflexion       Ankle inversion       Ankle eversion        (Blank rows = not tested)  GAIT: Distance walked:52ft Assistive device utilized: Single point cane Level of assistance: Modified independence Comments:antalgic   TODAY'S TREATMENT:                                                                                                                              DATE:  07/21/22:  Pt seen for aquatic therapy today.  Treatment took place in water 3.25-4.5 ft in depth at the [facility] pool. Temp of water was 91.  Pt entered/exited the pool via stairs independently with step-to pattern and bilateral rail.  - without support: walking forward/backward; forward walking kicks; forward marching - light jog forward/ backward  - holding yellow hand buoys: mini squats x 10;  leg swings into hip flex/ext x 10 each; heel / toe raises x 10; hip abdct/ addct 2 x10 *UE support on wall: 1/2 diamond -Seated on bench in water: cycling; long sitting hip  add/abd; alternating LAQ with DF x 20; cycling   -STS on water bench x 10 - standing quad  stretches with foot on blue step, opp foot on bench behind her - 3 reps of 20s each  - side stepping with arm addct/ abdct with shoulder addct with rainbow hand floats -> into side squats    Pt requires the buoyancy and hydrostatic pressure of water for support, and to offload joints by unweighting joint load by at least 50 % in navel deep water and by at least 75-80% in chest to neck deep water.  Viscosity of the water is needed for resistance of strengthening. Water current perturbations provides challenge to standing balance requiring increased core activation.   Treatment                            06/18/22:  See plan   2/23 Nu-step 5 min  Quad set 2x15 SAQ 2x10 SLR: 2x10  Seated clamshell 2x15 red  Standing heel raise 2x15  Bridge 2x10 Reviewed and updated HEP  Standing march x20   Reviewed patella mobilization. Reviewed self soft tissue mobilization of both knees.    PATIENT EDUCATION:  Education details: aquatics modifications and progressions   Person educated: Patient Education method: Medical illustrator Education comprehension: verbalized understanding and returned demonstration  HOME EXERCISE PROGRAM: Access Code: [email or web address] URL: [email or web address]   AQUATIC Access Code: [email or web address] URL: [email or web address] Date: 07/21/2022 Prepared by: [facility] Not issued yet -  This aquatic home exercise program from [employer] utilizes pictures from land based exercises, but has been adapted prior to lamination and issuance.    ASSESSMENT:  CLINICAL IMPRESSION: Pt edu on continued/future management of chronic condition. Added final exercises to aquatic HEP.  Will laminate and instruct next session. Plan to see pt for 2 more visits then probable DC as all goals should be met.. She tolerate session well today with added core challenges to knee exercises decreasing ue support with engagement. Complains of  some knee popping.  Goals ongoing     OBJECTIVE IMPAIRMENTS: Abnormal gait, difficulty walking, decreased ROM, decreased strength, increased edema, impaired flexibility, obesity, and pain.   ACTIVITY LIMITATIONS: squatting, stairs, transfers, and locomotion level  PARTICIPATION LIMITATIONS: community activity, occupation, yard work, and church  PERSONAL FACTORS: Age, Behavior pattern, and Fitness are also affecting patient's functional outcome.   REHAB POTENTIAL: Excellent  CLINICAL DECISION MAKING: Stable/uncomplicated  EVALUATION COMPLEXITY: Low   GOALS: Goals reviewed with patient? Yes  SHORT TERM GOALS: Target date: 04/17/2022   Will be compliant with appropriate progressive HEP  Baseline: Goal status: ongoing 04/16/22  2.  Pain to be no more than 7/10 at worst  Baseline:  Goal status: Met 04/16/22  3.  B knee extension AROM to be no more than -5 degrees and flexion AROM to be no less than 115 degrees  Baseline:  Goal status: Partially met (RLE not LLE)   LONG TERM GOALS: Target date: 10/21/22    MMT to be at least 4/5 in all tested groups  Baseline:  Goal status:achieved  2.  Pain to be no more than 5/10 at worst  Baseline: not as often, mostly rainy/cold days and AM soreness Goal status:ongoing  3.  Will be compliant in appropriate progressive prehab gym program (land or water) on an independent basis to assist in prehab prior to surgery and in weight loss efforts  Baseline:  Goal status:ongoing 4. ROM  0-120 bil  Goal status: NEW 5. Continued weight loss to qualify for surgery  Goal status: NEW    PLAN:  PT FREQUENCY: 1-2x/week  PT DURATION: 8 weeks  PLANNED INTERVENTIONS: Therapeutic exercises, Therapeutic activity, Neuromuscular re-education, Balance training, Gait training, Patient/Family education, Self Care, Joint mobilization, Stair training, Orthotic/Fit training, DME instructions, Aquatic Therapy, Dry Needling, Electrical stimulation, Spinal  mobilization, Cryotherapy, Moist heat, Taping, Ultrasound, Ionotophoresis 4mg /ml Dexamethasone, Manual therapy, and Re-evaluation  PLAN FOR NEXT SESSION: strength and ROM, general prehab program, core strength   Rushie Chestnut) Tenicia Gural MPT 07/23/22 5:07 PM [facility] [address] [place], [place], [address] Phone: [phone]   Fax:  [phone]

## 2022-07-28 ENCOUNTER — Ambulatory Visit (HOSPITAL_BASED_OUTPATIENT_CLINIC_OR_DEPARTMENT_OTHER): Payer: BC Managed Care – PPO | Admitting: Physical Therapy

## 2022-07-28 DIAGNOSIS — R262 Difficulty in walking, not elsewhere classified: Secondary | ICD-10-CM | POA: Diagnosis not present

## 2022-07-28 DIAGNOSIS — M25661 Stiffness of right knee, not elsewhere classified: Secondary | ICD-10-CM

## 2022-07-28 DIAGNOSIS — M6281 Muscle weakness (generalized): Secondary | ICD-10-CM | POA: Diagnosis not present

## 2022-07-28 DIAGNOSIS — M25662 Stiffness of left knee, not elsewhere classified: Secondary | ICD-10-CM | POA: Diagnosis not present

## 2022-07-28 NOTE — Therapy (Signed)
OUTPATIENT PHYSICAL THERAPY LOWER EXTREMITY TREATMENT   Patient Name: Veronica [name] MRN: [id number] DOB:Aug 20, 1970, 52 y.o., female Today's Date: 07/28/2022  END OF SESSION:  PT End of Session - 07/28/22 1759     Visit Number 19    Date for PT Re-Evaluation 10/21/22    PT Start Time 1611    PT Stop Time 1655    PT Time Calculation (min) 44 min    Activity Tolerance Patient tolerated treatment well    Behavior During Therapy [facility] for tasks assessed/performed                   Past Medical History:  Diagnosis Date   Anxiety    Arthritis    Asthma    DM (diabetes mellitus) (HCC)    GERD (gastroesophageal reflux disease)    HTN (hypertension)    Obesity    Sleep apnea    Vaginal bleeding 05/12/2021   Past Surgical History:  Procedure Laterality Date   CESAREAN SECTION     x 3   CHOLECYSTECTOMY     COLONOSCOPY WITH PROPOFOL N/A 03/02/2022   Procedure: COLONOSCOPY WITH PROPOFOL;  Surgeon: Tressia Danas, MD;  Location: [facility];  Service: Gastroenterology;  Laterality: N/A;   IR ABLATE LIVER CRYOABLATION  08/21/2021   IR ABLATE LIVER CRYOABLATION  08/21/2021   IR ABLATE LIVER CRYOABLATION  08/21/2021   IR ABLATE LIVER CRYOABLATION  09/17/2021   IR ABLATE LIVER CRYOABLATION  09/17/2021   IR ABLATE LIVER CRYOABLATION  09/17/2021   IR ABLATE LIVER CRYOABLATION  09/17/2021   IR RADIOLOGIST EVAL & MGMT  08/04/2021   IR RADIOLOGIST EVAL & MGMT  09/11/2021   IR RADIOLOGIST EVAL & MGMT  10/13/2021   LAPAROSCOPIC GASTRIC BANDING     SUPRACERVICAL ABDOMINAL HYSTERECTOMY  2017   Patient Active Problem List   Diagnosis Date Noted   Special screening for malignant neoplasms, colon 03/02/2022   Hyperlipidemia associated with type 2 diabetes mellitus (HCC) 09/29/2021   Vaginal septum 09/16/2021   Vitreomacular adhesion of both eyes 09/11/2021   Moderate persistent asthma with exacerbation 07/11/2021   HTN (hypertension), benign 07/11/2021   Type 2 diabetes  mellitus with hyperglycemia, without long-term current use of insulin (HCC) 07/02/2021   Morbid obesity (HCC) 07/01/2021   Pain in joint involving multiple sites 03/04/2021   History of colon polyps 03/04/2021   OSA (obstructive sleep apnea) 06/13/2020   GAD (generalized anxiety disorder) 04/10/2020   Memory difficulty 04/10/2020   Asthma 04/09/2020   S/P abdominal supracervical subtotal hysterectomy 04/09/2020   Insomnia 04/09/2020    PCP: Anne Ng NP   REFERRING PROVIDER: Marcene Corning, MD  REFERRING DIAG: M25.561 (ICD-10-CM) - Pain in right knee  THERAPY DIAG:  Difficulty walking  Muscle weakness (generalized)  Stiffness of left knee, not elsewhere classified  Stiffness of right knee, not elsewhere classified  Rationale for Evaluation and Treatment: Rehabilitation  ONSET DATE: 03/17/2022  SUBJECTIVE STATEMENT: Pt reports she is tired and forgot to take her afternoon dosage of ibuprofen.   PERTINENT HISTORY:  PAIN:  Are you having pain? Yes: NPRS scale: 3-4/10  Pain location: B anterior knees  Pain description: constant ache Aggravating factors: WB  Relieving factors: heat, stretches, medicines   PRECAUTIONS: None  WEIGHT BEARING RESTRICTIONS: No  FALLS:  Has patient fallen in last 6 months? 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  PLOF: Independent, Independent with basic ADLs, Independent with gait, and Independent with transfers  PATIENT GOALS: get ready for surgery, find a way to be able to exercise more to lose weight for surgery, general prehab- goal of surgery by Nov/Dec   OBJECTIVE:   DIAGNOSTIC FINDINGS:   PATIENT SURVEYS:  FOTO will do 2nd session   03/25/22: 40% with goal of 55%  05/04/22: 29%  3/28:  44  LOWER EXTREMITY ROM:  Active ROM Right eval Left eval Right / Left 04/16/22 Right / Left 2/12 Rt/Lt 3/28  Hip flexion       Hip extension       Hip abduction       Hip adduction       Hip internal rotation       Hip external rotation       Knee flexion 110* 106* 115 / 110 119/116 110/110  Knee extension 9* 12* 0 / -8 0/-5 -9/-18  Ankle dorsiflexion       Ankle plantarflexion       Ankle inversion       Ankle eversion        (Blank rows = not tested)  LOWER EXTREMITY MMT:  MMT Right eval Left eval Right / Left 04/16/22 Right / Left 2/12 Rt/Lt 3/28  Hip flexion 3 3 4- 4/4 36.3/26.3  Hip extension       Hip abduction 3 3 4- 4+/4+ SL above knee 27.8/20.6  Hip adduction       Hip internal rotation       Hip external rotation       Knee flexion 3- 3-  3+ 4+/4+   Knee extension 4- 4- 4+ 4+/4+ 50.7 lb/ 48.1  Ankle dorsiflexion       Ankle plantarflexion       Ankle inversion       Ankle eversion        (Blank rows = not tested)  GAIT: Distance walked:80ft Assistive device utilized: Single point cane Level of assistance: Modified independence Comments:antalgic   TODAY'S TREATMENT:                                                                                                                              DATE:  07/28/22 Pt seen for aquatic therapy today.  Treatment took place in water 3.25-4.5 ft in depth at the [facility] pool. Temp of water was 91.  Pt entered/exited the pool via stairs independently with step-to pattern and bilateral rail.  - without support: walking forward/backward;side stepping - At bench with feet onblue step: R/L quad stretch x 15s x2; STS 2 x 5; cycling; alternating LAQ with DF; long sitting hip abdct/ addct  - light jog forward/ backward with rainbow hand floats  -holding wall:  hip abdct/ addct 2 x 10 - holding yellow  hand buoys: mini squats x 10;  leg swings into hip flex/ext x 10 each; heel / toe raises x 10;  - walking  with bilat/ single yellow hand float under water at side for increased core engagement - Rt forward step ups x 5 (painful)  - UE support on wall: single leg clams  x 10 each  - side stepping with arm addct/ abdct with shoulder addct with rainbow hand floats -> into side squats  - staggered stance with kick board row 2 x 10   Pt requires the buoyancy and hydrostatic pressure of water for support, and to offload joints by unweighting joint load by at least 50 % in navel deep water and by at least 75-80% in chest to neck deep water.  Viscosity of the water is needed for resistance of strengthening. Water current perturbations provides challenge to standing balance requiring increased core activation.     PATIENT EDUCATION:  Education details: aquatics modifications and progressions   Person educated: Patient Education method: Medical illustrator Education comprehension: verbalized understanding and returned demonstration  HOME EXERCISE PROGRAM: Access Code: [email or web address] URL: [email or web address]   AQUATIC Access Code: [email or web address] URL: [email or web address] Date: 07/21/2022 Prepared by: [facility] - [address] Not issued yet -  This aquatic home exercise program from MedBridge utilizes pictures from land based exercises, but has been adapted prior to lamination and issuance.    ASSESSMENT:  CLINICAL IMPRESSION: Aquatic HEP exercises reviewed and slightly modified. Pt reported increased knee pain during session; limited tolerance for step ups.  Will issue laminated HEP next session. Plan to see pt for 1 more visits then probable DC as all goals should be met.  Therapist to assess goals next visit.     OBJECTIVE IMPAIRMENTS: Abnormal gait, difficulty walking, decreased ROM, decreased strength, increased edema, impaired flexibility, obesity, and pain.   ACTIVITY LIMITATIONS: squatting, stairs, transfers, and locomotion  level  PARTICIPATION LIMITATIONS: community activity, occupation, yard work, and church  PERSONAL FACTORS: Age, Behavior pattern, and Fitness are also affecting patient's functional outcome.   REHAB POTENTIAL: Excellent  CLINICAL DECISION MAKING: Stable/uncomplicated  EVALUATION COMPLEXITY: Low   GOALS: Goals reviewed with patient? Yes  SHORT TERM GOALS: Target date: 04/17/2022   Will be compliant with appropriate progressive HEP  Baseline: Goal status: ongoing 04/16/22  2.  Pain to be no more than 7/10 at worst  Baseline:  Goal status: Met 04/16/22  3.  B knee extension AROM to be no more than -5 degrees and flexion AROM to be no less than 115 degrees  Baseline:  Goal status: Partially met (RLE not LLE)   LONG TERM GOALS: Target date: 10/21/22    MMT to be at least 4/5 in all tested groups  Baseline:  Goal status:achieved  2.  Pain to be no more than 5/10 at worst  Baseline: not as often, mostly rainy/cold days and AM soreness Goal status:ongoing  3.  Will be compliant in appropriate progressive prehab gym program (land or water) on an independent basis to assist in prehab prior to surgery and in weight loss efforts  Baseline:  Goal status:ongoing  4. ROM 0-120 bil  Goal status: NEW  5. Continued weight loss to qualify for surgery  Goal status: MET -07/28/22    PLAN:  PT FREQUENCY: 1-2x/week  PT DURATION: 8 weeks  PLANNED INTERVENTIONS: Therapeutic exercises, Therapeutic activity, Neuromuscular re-education, Balance training, Gait training, Patient/Family education, Self Care, Joint mobilization, Stair training, Orthotic/Fit training, DME instructions, Aquatic Therapy, Dry  Needling, Electrical stimulation, Spinal mobilization, Cryotherapy, Moist heat, Taping, Ultrasound, Ionotophoresis 4mg /ml Dexamethasone, Manual therapy, and Re-evaluation  PLAN FOR NEXT SESSION: strength and ROM, general prehab program, core strength   Mayer Camel, PTA 07/28/22  6:02 PM [facility] [address] [place], [place], [address] Phone: [phone]   Fax:  [phone]

## 2022-07-30 ENCOUNTER — Ambulatory Visit (HOSPITAL_BASED_OUTPATIENT_CLINIC_OR_DEPARTMENT_OTHER): Payer: BC Managed Care – PPO | Admitting: Physical Therapy

## 2022-07-30 ENCOUNTER — Encounter (HOSPITAL_BASED_OUTPATIENT_CLINIC_OR_DEPARTMENT_OTHER): Payer: Self-pay

## 2022-08-04 ENCOUNTER — Ambulatory Visit (HOSPITAL_BASED_OUTPATIENT_CLINIC_OR_DEPARTMENT_OTHER): Payer: BC Managed Care – PPO | Admitting: Physical Therapy

## 2022-08-04 ENCOUNTER — Encounter (HOSPITAL_BASED_OUTPATIENT_CLINIC_OR_DEPARTMENT_OTHER): Payer: Self-pay | Admitting: Physical Therapy

## 2022-08-04 DIAGNOSIS — M25661 Stiffness of right knee, not elsewhere classified: Secondary | ICD-10-CM | POA: Diagnosis not present

## 2022-08-04 DIAGNOSIS — M25662 Stiffness of left knee, not elsewhere classified: Secondary | ICD-10-CM | POA: Diagnosis not present

## 2022-08-04 DIAGNOSIS — M6281 Muscle weakness (generalized): Secondary | ICD-10-CM | POA: Diagnosis not present

## 2022-08-04 DIAGNOSIS — R262 Difficulty in walking, not elsewhere classified: Secondary | ICD-10-CM

## 2022-08-04 NOTE — Therapy (Signed)
OUTPATIENT PHYSICAL THERAPY LOWER EXTREMITY TREATMENT  PHYSICAL THERAPY DISCHARGE SUMMARY  Visits from Start of Care: 20  Current functional level related to goals / functional outcomes: Indep   Remaining deficits: Pain right knee due to OA   Education / Equipment: Management of condition/ HEP   Patient agrees to discharge. Patient goals were partially met. Patient is being discharged due to maximized rehab potential.   Patient Name: Veronica [name] MRN: [id number] DOB:[birth year], 52 y.o., female Today's Date: 08/04/2022  END OF SESSION:  PT End of Session - 08/04/22 1710     Visit Number 20    Date for PT Re-Evaluation 10/21/22    PT Start Time 0504    PT Stop Time 0545    PT Time Calculation (min) 41 min    Activity Tolerance Patient tolerated treatment well    Behavior During Therapy [facility] for tasks assessed/performed                   Past Medical History:  Diagnosis Date   Anxiety    Arthritis    Asthma    DM (diabetes mellitus) (HCC)    GERD (gastroesophageal reflux disease)    HTN (hypertension)    Obesity    Sleep apnea    Vaginal bleeding 05/12/2021   Past Surgical History:  Procedure Laterality Date   CESAREAN SECTION     x 3   CHOLECYSTECTOMY     COLONOSCOPY WITH PROPOFOL N/A 03/02/2022   Procedure: COLONOSCOPY WITH PROPOFOL;  Surgeon: Tressia Danas, MD;  Location: [facility];  Service: Gastroenterology;  Laterality: N/A;   IR ABLATE LIVER CRYOABLATION  08/21/2021   IR ABLATE LIVER CRYOABLATION  08/21/2021   IR ABLATE LIVER CRYOABLATION  08/21/2021   IR ABLATE LIVER CRYOABLATION  09/17/2021   IR ABLATE LIVER CRYOABLATION  09/17/2021   IR ABLATE LIVER CRYOABLATION  09/17/2021   IR ABLATE LIVER CRYOABLATION  09/17/2021   IR RADIOLOGIST EVAL & MGMT  08/04/2021   IR RADIOLOGIST EVAL & MGMT  09/11/2021   IR RADIOLOGIST EVAL & MGMT  10/13/2021   LAPAROSCOPIC GASTRIC BANDING     SUPRACERVICAL ABDOMINAL HYSTERECTOMY  2017   Patient  Active Problem List   Diagnosis Date Noted   Special screening for malignant neoplasms, colon 03/02/2022   Hyperlipidemia associated with type 2 diabetes mellitus (HCC) 09/29/2021   Vaginal septum 09/16/2021   Vitreomacular adhesion of both eyes 09/11/2021   Moderate persistent asthma with exacerbation 07/11/2021   HTN (hypertension), benign 07/11/2021   Type 2 diabetes mellitus with hyperglycemia, without long-term current use of insulin (HCC) 07/02/2021   Morbid obesity (HCC) 07/01/2021   Pain in joint involving multiple sites 03/04/2021   History of colon polyps 03/04/2021   OSA (obstructive sleep apnea) 06/13/2020   GAD (generalized anxiety disorder) 04/10/2020   Memory difficulty 04/10/2020   Asthma 04/09/2020   S/P abdominal supracervical subtotal hysterectomy 04/09/2020   Insomnia 04/09/2020    PCP: Anne Ng NP   REFERRING PROVIDER: Marcene Corning, MD  REFERRING DIAG: M25.561 (ICD-10-CM) - Pain in right knee  THERAPY DIAG:  Difficulty walking  Muscle weakness (generalized)  Stiffness of left knee, not elsewhere classified  Stiffness of right knee, not elsewhere classified  Rationale for Evaluation and Treatment: Rehabilitation  ONSET DATE: 03/17/2022  SUBJECTIVE STATEMENT: Pt reports she is tired and forgot to take her afternoon dosage of ibuprofen.   PERTINENT HISTORY:  PAIN:  Are you having pain? Yes: NPRS scale: 3-4/10  Pain location:  B anterior knees  Pain description: constant ache Aggravating factors: WB  Relieving factors: heat, stretches, medicines   PRECAUTIONS: None  WEIGHT BEARING RESTRICTIONS: No  FALLS:  Has patient fallen in last 6 months? 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  PLOF: Independent,  Independent with basic ADLs, Independent with gait, and Independent with transfers  PATIENT GOALS: get ready for surgery, find a way to be able to exercise more to lose weight for surgery, general prehab- goal of surgery by Nov/Dec   OBJECTIVE:   DIAGNOSTIC FINDINGS:   PATIENT SURVEYS:  FOTO will do 2nd session   03/25/22: 40% with goal of 55%  05/04/22: 29%  3/28: 44  LOWER EXTREMITY ROM:  Active ROM Right eval Left eval Right / Left 04/16/22 Right / Left 2/12 Rt/Lt 3/28 Right / Left 5/14  Hip flexion        Hip extension        Hip abduction        Hip adduction        Hip internal rotation        Hip external rotation        Knee flexion 110* 106* 115 / 110 119/116 110/110 115 / 113  Knee extension 9* 12* 0 / -8 0/-5 -9/-18 0  Ankle dorsiflexion        Ankle plantarflexion        Ankle inversion        Ankle eversion         (Blank rows = not tested)  LOWER EXTREMITY MMT:  MMT Right eval Left eval Right / Left 04/16/22 Right / Left 2/12 Rt/Lt 3/28  Hip flexion 3 3 4- 4/4 36.3/26.3  Hip extension       Hip abduction 3 3 4- 4+/4+ SL above knee 27.8/20.6  Hip adduction       Hip internal rotation       Hip external rotation       Knee flexion 3- 3-  3+ 4+/4+   Knee extension 4- 4- 4+ 4+/4+ 50.7 lb/ 48.1  Ankle dorsiflexion       Ankle plantarflexion       Ankle inversion       Ankle eversion        (Blank rows = not tested)  GAIT: Distance walked:61ft Assistive device utilized: Single point cane Level of assistance: Modified independence Comments:antalgic   TODAY'S TREATMENT:                                                                                                                              DATE:  07/28/22 Pt seen for aquatic therapy today.  Treatment took place in water 3.25-4.5 ft in depth at  the [facility] pool. Temp of water was 91.  Pt entered/exited the pool via stairs independently with step-to pattern and bilateral  rail.  Access Code: [email or web address] URL: [email or web address] Date: 08/04/2022 Prepared by: Geni Bers  Exercises completed - Seated Long Arc Quad  - Forward and Backward Walking with Financial controller   - Side Stepping with Hand Floats  - Mini Squat with Counter Support   - Standing Knee Flexion with Counter Support   - Leg Swing Single Leg Balance   - Leg Swings Side to Side   - Quadricep Stretch with Chair and Counter Support - 2-3 reps - 20 seconds  hold - Sit to Stand  10 reps - Water Step Up on Bottom Step   - Wellsite geologist   - Single Leg Clamshell - Hold Wall   - Staggered Stance Row with Kick Board    Pt requires the buoyancy and hydrostatic pressure of water for support, and to offload joints by unweighting joint load by at least 50 % in navel deep water and by at least 75-80% in chest to neck deep water.  Viscosity of the water is needed for resistance of strengthening. Water current perturbations provides challenge to standing balance requiring increased core activation.     PATIENT EDUCATION:  Education details: aquatics modifications and progressions   Person educated: Patient Education method: Medical illustrator Education comprehension: verbalized understanding and returned demonstration  HOME EXERCISE PROGRAM: Access Code: [email or web address] URL: [email or web address]   AQUATIC Access Code: [email or web address] URL: [email or web address] Date: 07/21/2022 Prepared by: [facility] - [facility] Re[facility] - [address]   This aquatic home exercise program from [employer] utilizes pictures from land based exercises, but has been adapted prior to lamination and issuance.   Access Code: [email or web address] URL: [email or web address] Date: 08/04/2022 Prepared by: Geni Bers  Exercises - Seated Long Arc Quad  - 1 x daily - 7 x weekly - 3 sets - 10 reps - Forward and Backward Walking with Hand Floats  - 1 x daily - 7 x weekly - Side  Stepping with Hand Floats  - 1 x daily - 7 x weekly - Mini Squat with Counter Support  - 1 x daily - 7 x weekly - 3 sets - 10 reps - Standing Knee Flexion with Counter Support  - 1 x daily - 7 x weekly - 1-2 sets - 10 reps - Leg Swing Single Leg Balance  - 1 x daily - 7 x weekly - 1-2 sets - 10 reps - Leg Swings Side to Side  - 1 x daily - 7 x weekly - 1-2 sets - 10 reps - Quadricep Stretch with Chair and Counter Support  - 1 x daily - 7 x weekly - 2-3 reps - 20 seconds  hold - Sit to Stand  - 1 x daily - 7 x weekly - 3 sets - 10 reps - Water Step Up on Bottom Step  - 1 x daily - 7 x weekly - 3 sets - 10 reps - Hand Buoy Carry  - 1 x daily - 7 x weekly - 3 sets - 10 reps - Single Leg Clamshell - Hold Wall   - 1 x daily - 7 x weekly - 1-2 sets - 10 reps - Staggered Stance Row with Kick Board  - 1 x daily - 7 x weekly - 1-2 sets - 10 reps   ASSESSMENT:  CLINICAL IMPRESSION: Final instruction on aquatic HEP. Program issued.  She is given minor clarification and cues  for positioning for improved execution.  She has land based HEP and is encouraged to follow.  She is planning on going to the [place] [facility] center to use pool.  After conversation/instruction she has decided to just continue with HEPs indep and agrees to DC. She has met a majority of her goals.  Goals not met due to limitations due to pain and ongoing chronic condition.  She is ready for dc.    OBJECTIVE IMPAIRMENTS: Abnormal gait, difficulty walking, decreased ROM, decreased strength, increased edema, impaired flexibility, obesity, and pain.   ACTIVITY LIMITATIONS: squatting, stairs, transfers, and locomotion level  PARTICIPATION LIMITATIONS: community activity, occupation, yard work, and church  PERSONAL FACTORS: Age, Behavior pattern, and Fitness are also affecting patient's functional outcome.   REHAB POTENTIAL: Excellent  CLINICAL DECISION MAKING: Stable/uncomplicated  EVALUATION COMPLEXITY: Low   GOALS: Goals reviewed with  patient? Yes  SHORT TERM GOALS: Target date: 04/17/2022   Will be compliant with appropriate progressive HEP  Baseline: Goal status: Met 08/04/22 land based.   2.  Pain to be no more than 7/10 at worst  Baseline:  Goal status: Met 04/16/22  3.  B knee extension AROM to be no more than -5 degrees and flexion AROM to be no less than 115 degrees  Baseline:  Goal status: Partially met (RLE not LLE)   LONG TERM GOALS: Target date: 10/21/22    MMT to be at least 4/5 in all tested groups  Baseline:  Goal status:achieved  2.  Pain to be no more than 5/10 at worst  Baseline: not as often, mostly rainy/cold days and AM soreness Goal status:Not Met: Pt reports intermittent flares higher than 5>10  3.  Will be compliant in appropriate progressive prehab gym program (land or water) on an independent basis to assist in prehab prior to surgery and in weight loss efforts  Baseline:  Goal status:Met 08/04/22  4. ROM 0-120 bil  Goal status: Not Met 08/04/22  5. Continued weight loss to qualify for surgery  Goal status: MET -07/28/22    PLAN:  PT FREQUENCY: 1-2x/week  PT DURATION: 8 weeks  PLANNED INTERVENTIONS: Therapeutic exercises, Therapeutic activity, Neuromuscular re-education, Balance training, Gait training, Patient/Family education, Self Care, Joint mobilization, Stair training, Orthotic/Fit training, DME instructions, Aquatic Therapy, Dry Needling, Electrical stimulation, Spinal mobilization, Cryotherapy, Moist heat, Taping, Ultrasound, Ionotophoresis 4mg /ml Dexamethasone, Manual therapy, and Re-evaluation  PLAN FOR NEXT SESSION: strength and ROM, general prehab program, core strength   Rushie Chestnut) Kortlynn Poust MPT 08/04/22 5:41 PM [facility] [address] [place], [place], [address] Phone: [phone]   Fax:  [phone]

## 2022-08-06 ENCOUNTER — Ambulatory Visit (HOSPITAL_BASED_OUTPATIENT_CLINIC_OR_DEPARTMENT_OTHER): Payer: BC Managed Care – PPO | Admitting: Physical Therapy

## 2022-08-11 ENCOUNTER — Ambulatory Visit (HOSPITAL_BASED_OUTPATIENT_CLINIC_OR_DEPARTMENT_OTHER): Payer: BC Managed Care – PPO | Admitting: Physical Therapy

## 2022-08-13 ENCOUNTER — Ambulatory Visit (HOSPITAL_BASED_OUTPATIENT_CLINIC_OR_DEPARTMENT_OTHER): Payer: BC Managed Care – PPO | Admitting: Physical Therapy

## 2022-08-18 ENCOUNTER — Ambulatory Visit (HOSPITAL_BASED_OUTPATIENT_CLINIC_OR_DEPARTMENT_OTHER): Payer: BC Managed Care – PPO | Admitting: Physical Therapy

## 2022-08-20 ENCOUNTER — Ambulatory Visit (HOSPITAL_BASED_OUTPATIENT_CLINIC_OR_DEPARTMENT_OTHER): Payer: BC Managed Care – PPO | Admitting: Physical Therapy

## 2022-09-02 ENCOUNTER — Ambulatory Visit: Payer: BC Managed Care – PPO | Admitting: Nurse Practitioner

## 2022-09-02 ENCOUNTER — Telehealth: Payer: Self-pay | Admitting: Nurse Practitioner

## 2022-09-02 DIAGNOSIS — E1169 Type 2 diabetes mellitus with other specified complication: Secondary | ICD-10-CM

## 2022-09-02 MED ORDER — OZEMPIC (2 MG/DOSE) 8 MG/3ML ~~LOC~~ SOPN: 2.0000 mg | PEN_INJECTOR | SUBCUTANEOUS | 0 refills | Status: DC

## 2022-09-02 NOTE — Telephone Encounter (Signed)
Pt was late for her appointment with [facility] for 09/02/22. She is going to cb and schedule an appt when she can see her work schedule. She is needing a refill on her  Semaglutide, 2 MG/DOSE, (OZEMPIC, 2 MG/DOSE,) 8 MG/3ML SOPN [[id number]]   [id number] Judithann Sheen, [place] - [address] [place] [address] Phone: [phone]  Fax: [phone] DEA #: [id number]

## 2022-09-15 ENCOUNTER — Encounter (INDEPENDENT_AMBULATORY_CARE_PROVIDER_SITE_OTHER): Payer: BC Managed Care – PPO | Admitting: Ophthalmology

## 2022-09-16 ENCOUNTER — Other Ambulatory Visit: Payer: Self-pay | Admitting: Nurse Practitioner

## 2022-09-16 DIAGNOSIS — E1169 Type 2 diabetes mellitus with other specified complication: Secondary | ICD-10-CM

## 2022-10-14 NOTE — Telephone Encounter (Signed)
Appt scheduled for 10/21/2022.

## 2022-10-15 ENCOUNTER — Ambulatory Visit: Payer: BC Managed Care – PPO | Admitting: Nurse Practitioner

## 2022-10-21 ENCOUNTER — Encounter: Payer: Self-pay | Admitting: Nurse Practitioner

## 2022-10-21 ENCOUNTER — Ambulatory Visit (INDEPENDENT_AMBULATORY_CARE_PROVIDER_SITE_OTHER): Payer: BC Managed Care – PPO | Admitting: Nurse Practitioner

## 2022-10-21 VITALS — BP 120/80 | HR 80 | Temp 98.5°F | Resp 16 | Ht 64.0 in | Wt 280.0 lb

## 2022-10-21 DIAGNOSIS — Z7985 Long-term (current) use of injectable non-insulin antidiabetic drugs: Secondary | ICD-10-CM | POA: Diagnosis not present

## 2022-10-21 DIAGNOSIS — E1169 Type 2 diabetes mellitus with other specified complication: Secondary | ICD-10-CM | POA: Diagnosis not present

## 2022-10-21 DIAGNOSIS — I10 Essential (primary) hypertension: Secondary | ICD-10-CM

## 2022-10-21 DIAGNOSIS — F411 Generalized anxiety disorder: Secondary | ICD-10-CM

## 2022-10-21 DIAGNOSIS — E785 Hyperlipidemia, unspecified: Secondary | ICD-10-CM | POA: Diagnosis not present

## 2022-10-21 LAB — MICROALBUMIN / CREATININE URINE RATIO
Creatinine,U: 201.7 mg/dL
Microalb Creat Ratio: 0.7 mg/g (ref 0.0–30.0)
Microalb, Ur: 1.5 mg/dL (ref 0.0–1.9)

## 2022-10-21 LAB — POCT GLYCOSYLATED HEMOGLOBIN (HGB A1C): Hemoglobin A1C: 5.7 % — AB (ref 4.0–5.6)

## 2022-10-21 MED ORDER — ATORVASTATIN CALCIUM 20 MG PO TABS
20.0000 mg | ORAL_TABLET | Freq: Every day | ORAL | 3 refills | Status: DC
Start: 2022-10-21 — End: 2023-10-01

## 2022-10-21 MED ORDER — LOSARTAN POTASSIUM 25 MG PO TABS
25.0000 mg | ORAL_TABLET | Freq: Every day | ORAL | 3 refills | Status: DC
Start: 2022-10-21 — End: 2023-01-25

## 2022-10-21 MED ORDER — MOUNJARO 5 MG/0.5ML ~~LOC~~ SOAJ
5.0000 mg | SUBCUTANEOUS | 1 refills | Status: DC
Start: 2022-10-21 — End: 2022-10-21

## 2022-10-21 MED ORDER — MOUNJARO 5 MG/0.5ML ~~LOC~~ SOAJ
5.0000 mg | SUBCUTANEOUS | 5 refills | Status: DC
Start: 2022-10-21 — End: 2022-10-29

## 2022-10-21 MED ORDER — DULOXETINE HCL 20 MG PO CPEP
20.0000 mg | ORAL_CAPSULE | Freq: Every day | ORAL | 3 refills | Status: DC
Start: 2022-10-21 — End: 2023-09-30

## 2022-10-21 NOTE — Assessment & Plan Note (Signed)
LDL at goal Maintain atorvastatin dose 

## 2022-10-21 NOTE — Assessment & Plan Note (Signed)
Controlled with previous HgbA1c at 6.0% Current use of ozempic 2.0mg , resolved constipation, noticed increased appetite, no glucose check at home LDL at goal with atorvastatin Repeat UACr today No neuropathy DIABETES eye exam 12/08/2022.  Repeat hgbA1c today: 5.7% Switch ozempic to mounjaro 5mg  weekly F/up in 3-22months

## 2022-10-21 NOTE — Assessment & Plan Note (Signed)
BP at goal with losartan BP Readings from Last 3 Encounters:  10/21/22 120/80  06/15/22 120/82  03/09/22 122/84    Maintain med dose

## 2022-10-21 NOTE — Progress Notes (Signed)
Established Patient Visit  Patient: Veronica [name]   DOB: May 13, 1970   52 y.o. Female  MRN: [id number] Visit Date: 10/21/2022  Subjective:    Chief Complaint  Patient presents with   Medication Refill   Medical Management of Chronic Issues   Medication Refill   DM (diabetes mellitus) (HCC) Controlled with previous HgbA1c at 6.0% Current use of ozempic 2.0mg , resolved constipation, noticed increased appetite, no glucose check at home LDL at goal with atorvastatin Repeat UACr today No neuropathy DIABETES eye exam 12/08/2022.  Repeat hgbA1c today: 5.7% Switch ozempic to mounjaro 5mg  weekly F/up in 3-36months  HTN (hypertension), benign BP at goal with losartan BP Readings from Last 3 Encounters:  10/21/22 120/80  06/15/22 120/82  03/09/22 122/84    Maintain med dose  Hyperlipidemia associated with type 2 diabetes mellitus (HCC) LDL at goal Maintain atorvastatin dose   Wt Readings from Last 3 Encounters:  10/21/22 280 lb (127 kg)  06/15/22 272 lb (123.4 kg)  03/09/22 278 lb (126.1 kg)     Reviewed medical, surgical, and social history today  Medications: Outpatient Medications Prior to Visit  Medication Sig   albuterol (VENTOLIN HFA) 108 (90 Base) MCG/ACT inhaler INHALE 1-2 PUFFS BY MOUTH EVERY 6 HOURS AS NEEDED FOR WHEEZE OR SHORTNESS OF BREATH   Ascorbic Acid (VITAMIN C) 1000 MG tablet Take 1,000 mg by mouth daily.   CELEBREX 200 MG capsule Take 200 mg by mouth daily as needed.   Chlorphen-PE-Acetaminophen (TYLENOL ALLERGY MULTI-SYMPTOM PO) Take 2 tablets by mouth every 4 (four) hours as needed (allergies).   cyanocobalamin (VITAMIN B12) 1000 MCG tablet Take 1,000 mcg by mouth daily.   DULoxetine (CYMBALTA) 20 MG capsule Take 1 capsule (20 mg total) by mouth daily.   fluticasone (FLONASE) 50 MCG/ACT nasal spray Place 2 sprays into both nostrils daily. (Patient taking differently: Place 2 sprays into both nostrils daily as needed for allergies.)    losartan (COZAAR) 25 MG tablet TAKE 1 TABLET (25 MG TOTAL) BY MOUTH DAILY.   montelukast (SINGULAIR) 10 MG tablet TAKE 1 TABLET BY MOUTH EVERYDAY AT BEDTIME   Multiple Vitamin (MULTIVITAMIN) tablet Take 1 tablet by mouth daily.   polyethylene glycol (MIRALAX) 17 g packet Take 17 g by mouth daily. (Patient taking differently: Take 17 g by mouth daily as needed for moderate constipation.)   [DISCONTINUED] atorvastatin (LIPITOR) 20 MG tablet Take 1 tablet (20 mg total) by mouth daily.   [DISCONTINUED] Naltrexone-buPROPion HCl ER 8-90 MG TB12 Take 1 tablet by mouth in the morning.   [DISCONTINUED] Semaglutide, 2 MG/DOSE, (OZEMPIC, 2 MG/DOSE,) 8 MG/3ML SOPN Inject 2 mg into the skin once a week.   No facility-administered medications prior to visit.   Reviewed past medical and social history.   ROS per HPI above      Objective:  BP 120/80   Pulse 80   Temp 98.5 F (36.9 C) (Temporal)   Resp 16   Ht 5\' 4"  (1.626 m)   Wt 280 lb (127 kg)   LMP 06/15/2010   SpO2 98%   BMI 48.06 kg/m      Physical Exam Cardiovascular:     Rate and Rhythm: Normal rate and regular rhythm.     Pulses: Normal pulses.     Heart sounds: Normal heart sounds.  Pulmonary:     Effort: Pulmonary effort is normal.     Breath sounds: Normal breath sounds.  Neurological:     Mental Status: She is alert and oriented to person, place, and time.     Results for orders placed or performed in visit on 10/21/22  POCT glycosylated hemoglobin (Hb A1C)  Result Value Ref Range   Hemoglobin A1C 5.7 (A) 4.0 - 5.6 %   HbA1c POC (<> result, manual entry)     HbA1c, POC (prediabetic range)     HbA1c, POC (controlled diabetic range)        Assessment & Plan:    Problem List Items Addressed This Visit       Cardiovascular and Mediastinum   HTN (hypertension), benign    BP at goal with losartan BP Readings from Last 3 Encounters:  10/21/22 120/80  06/15/22 120/82  03/09/22 122/84    Maintain med dose       Relevant Medications   atorvastatin (LIPITOR) 20 MG tablet     Endocrine   DM (diabetes mellitus) (HCC) - Primary    Controlled with previous HgbA1c at 6.0% Current use of ozempic 2.0mg , resolved constipation, noticed increased appetite, no glucose check at home LDL at goal with atorvastatin Repeat UACr today No neuropathy DIABETES eye exam 12/08/2022.  Repeat hgbA1c today: 5.7% Switch ozempic to mounjaro 5mg  weekly F/up in 3-51months      Relevant Medications   atorvastatin (LIPITOR) 20 MG tablet   tirzepatide (MOUNJARO) 5 MG/0.5ML Pen   Other Relevant Orders   POCT glycosylated hemoglobin (Hb A1C) (Completed)   Microalbumin / creatinine urine ratio   Hyperlipidemia associated with type 2 diabetes mellitus (HCC)    LDL at goal Maintain atorvastatin dose      Relevant Medications   atorvastatin (LIPITOR) 20 MG tablet   tirzepatide (MOUNJARO) 5 MG/0.5ML Pen   Return in about 20 weeks (around 03/10/2023) for DM, HTN, hyperlipidemia, CPE (fasting).     Alysia Penna, NP

## 2022-10-21 NOTE — Patient Instructions (Addendum)
HgbA1c at 5.7% controlled DM Go to lab for urine collection Stop ozempic, start mounjaro 5mg  weekly Have eye exam report faxed to me.

## 2022-10-22 NOTE — Progress Notes (Signed)
Stable Follow instructions as discussed during office visit.

## 2022-10-26 ENCOUNTER — Other Ambulatory Visit (HOSPITAL_COMMUNITY): Payer: Self-pay

## 2022-10-26 ENCOUNTER — Telehealth: Payer: Self-pay

## 2022-10-26 NOTE — Telephone Encounter (Signed)
Questions answered.  PA submitted. 

## 2022-10-26 NOTE — Telephone Encounter (Signed)
Pharmacy Patient Advocate Encounter   Received notification from CoverMyMeds that prior authorization for [facility] 5MG /0.5ML pen-injectors is required/requested.   Insurance verification completed.   The patient is insured through [facility] .   Per test claim: PA required; PA started via CoverMyMeds. KEY [id number] . Waiting for clinical questions to populate.

## 2022-10-29 DIAGNOSIS — E1169 Type 2 diabetes mellitus with other specified complication: Secondary | ICD-10-CM

## 2022-10-29 MED ORDER — OZEMPIC (2 MG/DOSE) 8 MG/3ML ~~LOC~~ SOPN
2.0000 mg | PEN_INJECTOR | SUBCUTANEOUS | 1 refills | Status: DC
Start: 2022-10-29 — End: 2023-01-25

## 2022-10-29 NOTE — Telephone Encounter (Signed)
Pharmacy Patient Advocate Encounter  Received notification from [facility] that Prior Authorization for [facility] 5mg /0.67ml as been DENIED. Please advise how you'd like to proceed. Full denial letter will be uploaded to the media tab. See denial reason below.   PA #/Case ID/Reference #: [id number]

## 2022-11-03 DIAGNOSIS — M1712 Unilateral primary osteoarthritis, left knee: Secondary | ICD-10-CM | POA: Diagnosis not present

## 2022-11-03 DIAGNOSIS — M170 Bilateral primary osteoarthritis of knee: Secondary | ICD-10-CM | POA: Diagnosis not present

## 2022-11-03 DIAGNOSIS — M1711 Unilateral primary osteoarthritis, right knee: Secondary | ICD-10-CM | POA: Diagnosis not present

## 2022-11-16 ENCOUNTER — Other Ambulatory Visit: Payer: Self-pay | Admitting: Medical Genetics

## 2022-11-16 DIAGNOSIS — Z006 Encounter for examination for normal comparison and control in clinical research program: Secondary | ICD-10-CM

## 2022-11-18 DIAGNOSIS — M1711 Unilateral primary osteoarthritis, right knee: Secondary | ICD-10-CM | POA: Diagnosis not present

## 2022-11-18 DIAGNOSIS — M170 Bilateral primary osteoarthritis of knee: Secondary | ICD-10-CM | POA: Diagnosis not present

## 2022-11-18 DIAGNOSIS — M1712 Unilateral primary osteoarthritis, left knee: Secondary | ICD-10-CM | POA: Diagnosis not present

## 2022-11-19 DIAGNOSIS — M1712 Unilateral primary osteoarthritis, left knee: Secondary | ICD-10-CM | POA: Diagnosis not present

## 2022-12-07 ENCOUNTER — Other Ambulatory Visit (HOSPITAL_COMMUNITY): Payer: Self-pay

## 2023-01-25 ENCOUNTER — Ambulatory Visit (INDEPENDENT_AMBULATORY_CARE_PROVIDER_SITE_OTHER): Payer: BC Managed Care – PPO | Admitting: Nurse Practitioner

## 2023-01-25 ENCOUNTER — Encounter: Payer: Self-pay | Admitting: Nurse Practitioner

## 2023-01-25 VITALS — BP 133/84 | HR 83 | Temp 97.4°F | Resp 18 | Ht 64.0 in | Wt 280.2 lb

## 2023-01-25 DIAGNOSIS — E1169 Type 2 diabetes mellitus with other specified complication: Secondary | ICD-10-CM

## 2023-01-25 DIAGNOSIS — I10 Essential (primary) hypertension: Secondary | ICD-10-CM

## 2023-01-25 DIAGNOSIS — E785 Hyperlipidemia, unspecified: Secondary | ICD-10-CM

## 2023-01-25 DIAGNOSIS — Z7985 Long-term (current) use of injectable non-insulin antidiabetic drugs: Secondary | ICD-10-CM | POA: Diagnosis not present

## 2023-01-25 DIAGNOSIS — B0089 Other herpesviral infection: Secondary | ICD-10-CM | POA: Diagnosis not present

## 2023-01-25 DIAGNOSIS — Z23 Encounter for immunization: Secondary | ICD-10-CM | POA: Diagnosis not present

## 2023-01-25 LAB — COMPREHENSIVE METABOLIC PANEL
ALT: 16 U/L (ref 0–35)
AST: 17 U/L (ref 0–37)
Albumin: 4.1 g/dL (ref 3.5–5.2)
Alkaline Phosphatase: 97 U/L (ref 39–117)
BUN: 13 mg/dL (ref 6–23)
CO2: 28 meq/L (ref 19–32)
Calcium: 9.3 mg/dL (ref 8.4–10.5)
Chloride: 104 meq/L (ref 96–112)
Creatinine, Ser: 0.87 mg/dL (ref 0.40–1.20)
GFR: 75.34 mL/min (ref >60.00)
Glucose, Bld: 85 mg/dL (ref 70–99)
Potassium: 3.8 meq/L (ref 3.5–5.1)
Sodium: 141 meq/L (ref 135–145)
Total Bilirubin: 0.4 mg/dL (ref 0.2–1.2)
Total Protein: 7.2 g/dL (ref 6.0–8.3)

## 2023-01-25 LAB — LDL CHOLESTEROL, DIRECT: Direct LDL: 69 mg/dL

## 2023-01-25 LAB — HEMOGLOBIN A1C: Hgb A1c MFr Bld: 6.2 % (ref 4.6–6.5)

## 2023-01-25 MED ORDER — OZEMPIC (2 MG/DOSE) 8 MG/3ML ~~LOC~~ SOPN: 2.0000 mg | PEN_INJECTOR | SUBCUTANEOUS | 1 refills | Status: DC

## 2023-01-25 MED ORDER — LOSARTAN POTASSIUM 50 MG PO TABS: 50.0000 mg | ORAL_TABLET | Freq: Every day | ORAL | 1 refills | Status: DC

## 2023-01-25 MED ORDER — CONTRAVE 8-90 MG PO TB12: 1.0000 | ORAL_TABLET | Freq: Two times a day (BID) | ORAL | 0 refills | Status: DC

## 2023-01-25 NOTE — Progress Notes (Signed)
Established Patient Visit  Patient: Veronica [name]   DOB: [birth year]   52 y.o. Female  MRN: [id number] Visit Date: 01/25/2023  Subjective:    Chief Complaint  Patient presents with   office visit     PT is here for follow up hypertension, hyperlipidemia, depression and medication management and RX refill request; eye exam is due. PT c/o of rash on left breast for 3 weeks patient used OTC cream   HPI Morbid obesity (HCC) Started use of contrave 1tab daily x 3weeks. Denies any adverse effects. Reports persistent emotional eating. Denied referral to therapist and nutritionist, stating she has material already. Limited exercise due to work schedule and chronic knee pain. Wt Readings from Last 3 Encounters:  01/25/23 280 lb 3.2 oz (127.1 kg)  10/21/22 280 lb (127 kg)  06/15/22 272 lb (123.4 kg)    Increase contrave dose to 1tab BID, advised about possible healthy snacks. Advised to schedule appointment with therapist F/up in 40month  DM (diabetes mellitus) (HCC) No adverse effects with ozempic 2mg  weekly Normal Dm foot exam today Advised to schedule DIABETES eye exam.  Repeat hgbA1c and CMP Maintain med dose F/up in 3months  Hyperlipidemia associated with type 2 diabetes mellitus (HCC) Non fasting today, so obtain direct LDL Maintain atorvastatin dose Advised about importance of mediterranean diet  HTN (hypertension), benign DBP not at goal, SBP at goal Limited exercise due to chronic knee pain. BP Readings from Last 3 Encounters:  01/25/23 133/84  10/21/22 120/80  06/15/22 120/82    Increase losartan to 50mg  every day Repeat CMP F/up in 40month  Started contrave 3weeks ago 1tab daily.  Wt Readings from Last 3 Encounters:  01/25/23 280 lb 3.2 oz (127.1 kg)  10/21/22 280 lb (127 kg)  06/15/22 272 lb (123.4 kg)    Reviewed medical, surgical, and social history today  Medications: Outpatient Medications Prior to Visit  Medication Sig   albuterol  (VENTOLIN HFA) 108 (90 Base) MCG/ACT inhaler INHALE 1-2 PUFFS BY MOUTH EVERY 6 HOURS AS NEEDED FOR WHEEZE OR SHORTNESS OF BREATH   Ascorbic Acid (VITAMIN C) 1000 MG tablet Take 1,000 mg by mouth daily.   atorvastatin (LIPITOR) 20 MG tablet Take 1 tablet (20 mg total) by mouth daily.   CELEBREX 200 MG capsule Take 200 mg by mouth daily as needed.   cyanocobalamin (VITAMIN B12) 1000 MCG tablet Take 1,000 mcg by mouth daily.   DULoxetine (CYMBALTA) 20 MG capsule Take 1 capsule (20 mg total) by mouth daily.   fluticasone (FLONASE) 50 MCG/ACT nasal spray Place 2 sprays into both nostrils daily. (Patient taking differently: Place 2 sprays into both nostrils daily as needed for allergies.)   montelukast (SINGULAIR) 10 MG tablet TAKE 1 TABLET BY MOUTH EVERYDAY AT BEDTIME   Multiple Vitamin (MULTIVITAMIN) tablet Take 1 tablet by mouth daily.   polyethylene glycol (MIRALAX) 17 g packet Take 17 g by mouth daily. (Patient taking differently: Take 17 g by mouth daily as needed for moderate constipation.)   tiZANidine (ZANAFLEX) 2 MG tablet Take 2 mg by mouth 3 (three) times daily.   [DISCONTINUED] Chlorphen-PE-Acetaminophen (TYLENOL ALLERGY MULTI-SYMPTOM PO) Take 2 tablets by mouth every 4 (four) hours as needed (allergies).   [DISCONTINUED] CONTRAVE 8-90 MG TB12 Take 1 tablet by mouth every morning.   [DISCONTINUED] losartan (COZAAR) 25 MG tablet Take 1 tablet (25 mg total) by mouth daily.   [DISCONTINUED] predniSONE (STERAPRED  UNI-PAK 21 TAB) 10 MG (21) TBPK tablet TAKE 6 TABLETS ON DAY 1 AS DIRECTED ON PACKAGE AND DECREASE BY 1 TAB EACH DAY FOR A TOTAL OF 6 DAYS   [DISCONTINUED] Semaglutide, 2 MG/DOSE, (OZEMPIC, 2 MG/DOSE,) 8 MG/3ML SOPN Inject 2 mg into the skin once a week.   No facility-administered medications prior to visit.   Reviewed past medical and social history.   ROS per HPI above      Objective:  BP 133/84 (BP Location: Left Arm, Patient Position: Sitting, Cuff Size: Large)   Pulse 83    Temp (!) 97.4 F (36.3 C) (Temporal)   Resp 18   Ht 5\' 4"  (1.626 m)   Wt 280 lb 3.2 oz (127.1 kg)   LMP 06/15/2010   SpO2 98%   BMI 48.10 kg/m      Physical Exam Vitals and nursing note reviewed.  Cardiovascular:     Rate and Rhythm: Normal rate.     Pulses: Normal pulses.  Pulmonary:     Effort: Pulmonary effort is normal.  Musculoskeletal:     Right lower leg: No edema.     Left lower leg: No edema.  Neurological:     Mental Status: She is alert and oriented to person, place, and time.     Results for orders placed or performed in visit on 01/25/23  Hemoglobin A1c  Result Value Ref Range   Hgb A1c MFr Bld 6.2 4.6 - 6.5 %  Comprehensive metabolic panel  Result Value Ref Range   Sodium 141 135 - 145 mEq/L   Potassium 3.8 3.5 - 5.1 mEq/L   Chloride 104 96 - 112 mEq/L   CO2 28 19 - 32 mEq/L   Glucose, Bld 85 70 - 99 mg/dL   BUN 13 6 - 23 mg/dL   Creatinine, Ser 8.29 0.40 - 1.20 mg/dL   Total Bilirubin 0.4 0.2 - 1.2 mg/dL   Alkaline Phosphatase 97 39 - 117 U/L   AST 17 0 - 37 U/L   ALT 16 0 - 35 U/L   Total Protein 7.2 6.0 - 8.3 g/dL   Albumin 4.1 3.5 - 5.2 g/dL   GFR 56.21 >30.86 mL/min   Calcium 9.3 8.4 - 10.5 mg/dL  Direct LDL  Result Value Ref Range   Direct LDL 69.0 mg/dL      Assessment & Plan:    Problem List Items Addressed This Visit     DM (diabetes mellitus) (HCC)    No adverse effects with ozempic 2mg  weekly Normal Dm foot exam today Advised to schedule DIABETES eye exam.  Repeat hgbA1c and CMP Maintain med dose F/up in 3months      Relevant Medications   losartan (COZAAR) 50 MG tablet   Semaglutide, 2 MG/DOSE, (OZEMPIC, 2 MG/DOSE,) 8 MG/3ML SOPN   Other Relevant Orders   Hemoglobin A1c (Completed)   Comprehensive metabolic panel (Completed)   HTN (hypertension), benign - Primary    DBP not at goal, SBP at goal Limited exercise due to chronic knee pain. BP Readings from Last 3 Encounters:  01/25/23 133/84  10/21/22 120/80   06/15/22 120/82    Increase losartan to 50mg  every day Repeat CMP F/up in 31month      Relevant Medications   losartan (COZAAR) 50 MG tablet   Other Relevant Orders   Comprehensive metabolic panel (Completed)   Hyperlipidemia associated with type 2 diabetes mellitus (HCC)    Non fasting today, so obtain direct LDL Maintain atorvastatin dose Advised about  importance of mediterranean diet      Relevant Medications   losartan (COZAAR) 50 MG tablet   Semaglutide, 2 MG/DOSE, (OZEMPIC, 2 MG/DOSE,) 8 MG/3ML SOPN   Other Relevant Orders   Comprehensive metabolic panel (Completed)   Direct LDL (Completed)   Morbid obesity (HCC)    Started use of contrave 1tab daily x 3weeks. Denies any adverse effects. Reports persistent emotional eating. Denied referral to therapist and nutritionist, stating she has material already. Limited exercise due to work schedule and chronic knee pain. Wt Readings from Last 3 Encounters:  01/25/23 280 lb 3.2 oz (127.1 kg)  10/21/22 280 lb (127 kg)  06/15/22 272 lb (123.4 kg)    Increase contrave dose to 1tab BID, advised about possible healthy snacks. Advised to schedule appointment with therapist F/up in 9month      Relevant Medications   CONTRAVE 8-90 MG TB12   Semaglutide, 2 MG/DOSE, (OZEMPIC, 2 MG/DOSE,) 8 MG/3ML SOPN   Other Visit Diagnoses     Herpetic dermatitis       Immunization due       Relevant Orders   Flu Vaccine Trivalent High Dose (Fluad) (Completed)   Primary hypertension       Relevant Medications   losartan (COZAAR) 50 MG tablet      Return in about 4 weeks (around 02/22/2023) for Weight management, HTN.     Alysia Penna, NP

## 2023-01-25 NOTE — Assessment & Plan Note (Signed)
DBP not at goal, SBP at goal Limited exercise due to chronic knee pain. BP Readings from Last 3 Encounters:  01/25/23 133/84  10/21/22 120/80  06/15/22 120/82    Increase losartan to 50mg  every day Repeat CMP F/up in 61month

## 2023-01-25 NOTE — Patient Instructions (Signed)
Schedule appointment with ophthalmology for DIABETES eye exam Schedule appointment with therapist. Increase contrave dose to 1tab BID Go to lab.

## 2023-01-25 NOTE — Assessment & Plan Note (Signed)
No adverse effects with ozempic 2mg  weekly Normal Dm foot exam today Advised to schedule DIABETES eye exam.  Repeat hgbA1c and CMP Maintain med dose F/up in 3months

## 2023-01-25 NOTE — Assessment & Plan Note (Signed)
Started use of contrave 1tab daily x 3weeks. Denies any adverse effects. Reports persistent emotional eating. Denied referral to therapist and nutritionist, stating she has material already. Limited exercise due to work schedule and chronic knee pain. Wt Readings from Last 3 Encounters:  01/25/23 280 lb 3.2 oz (127.1 kg)  10/21/22 280 lb (127 kg)  06/15/22 272 lb (123.4 kg)    Increase contrave dose to 1tab BID, advised about possible healthy snacks. Advised to schedule appointment with therapist F/up in 55month

## 2023-01-25 NOTE — Assessment & Plan Note (Signed)
Non fasting today, so obtain direct LDL Maintain atorvastatin dose Advised about importance of mediterranean diet

## 2023-02-11 ENCOUNTER — Other Ambulatory Visit (HOSPITAL_COMMUNITY): Payer: Self-pay

## 2023-02-15 ENCOUNTER — Other Ambulatory Visit (HOSPITAL_COMMUNITY): Payer: BC Managed Care – PPO

## 2023-02-25 ENCOUNTER — Ambulatory Visit: Payer: BC Managed Care – PPO | Admitting: Nurse Practitioner

## 2023-02-25 ENCOUNTER — Encounter: Payer: Self-pay | Admitting: Nurse Practitioner

## 2023-02-25 VITALS — BP 140/82 | HR 87 | Temp 98.2°F | Resp 18 | Ht 64.0 in | Wt 279.2 lb

## 2023-02-25 DIAGNOSIS — Z6841 Body Mass Index (BMI) 40.0 and over, adult: Secondary | ICD-10-CM

## 2023-02-25 DIAGNOSIS — I10 Essential (primary) hypertension: Secondary | ICD-10-CM | POA: Diagnosis not present

## 2023-02-25 MED ORDER — CONTRAVE 8-90 MG PO TB12: 1.0000 | ORAL_TABLET | Freq: Two times a day (BID) | ORAL | 0 refills | Status: DC

## 2023-02-25 NOTE — Assessment & Plan Note (Signed)
BP not at goal due to med dose and diet non compliance BP Readings from Last 3 Encounters:  02/25/23 (!) 140/82  01/25/23 133/84  10/21/22 120/80    Advised about the importance of med and diet compliance. Maintain med dose F/up in 101month

## 2023-02-25 NOTE — Patient Instructions (Addendum)
Maintain current medications Maintain DASh diet and daily exercise

## 2023-02-25 NOTE — Progress Notes (Signed)
Established Patient Visit  Patient: Veronica [name]   DOB: 1971-03-09   52 y.o. Female  MRN: [id number] Visit Date: 02/25/2023  Subjective:    Chief Complaint  Patient presents with   OFFICE VISIT     1 month follow up weight management. PT is due for eye exam    HPI Morbid obesity (HCC) Denies any adverse effects with contrave 1tab BID. No exercise regimen She continues to struggle to making right food choices Lost 1lb in last 73month Wt Readings from Last 3 Encounters:  02/25/23 279 lb 3.2 oz (126.6 kg)  01/25/23 280 lb 3.2 oz (127.1 kg)  10/21/22 280 lb (127 kg)    Encouraged to add daily exercise and heart healthy diet Maintain med dose F/up in 73month   HTN (hypertension), benign BP not at goal due to med dose and diet non compliance BP Readings from Last 3 Encounters:  02/25/23 (!) 140/82  01/25/23 133/84  10/21/22 120/80    Advised about the importance of med and diet compliance. Maintain med dose F/up in 73month  Wt Readings from Last 3 Encounters:  02/25/23 279 lb 3.2 oz (126.6 kg)  01/25/23 280 lb 3.2 oz (127.1 kg)  10/21/22 280 lb (127 kg)    BP Readings from Last 3 Encounters:  02/25/23 (!) 140/82  01/25/23 133/84  10/21/22 120/80    Reviewed medical, surgical, and social history today  Medications: Outpatient Medications Prior to Visit  Medication Sig   albuterol (VENTOLIN HFA) 108 (90 Base) MCG/ACT inhaler INHALE 1-2 PUFFS BY MOUTH EVERY 6 HOURS AS NEEDED FOR WHEEZE OR SHORTNESS OF BREATH   Ascorbic Acid (VITAMIN C) 1000 MG tablet Take 1,000 mg by mouth daily.   atorvastatin (LIPITOR) 20 MG tablet Take 1 tablet (20 mg total) by mouth daily.   CELEBREX 200 MG capsule Take 200 mg by mouth daily as needed.   cyanocobalamin (VITAMIN B12) 1000 MCG tablet Take 1,000 mcg by mouth daily.   DULoxetine (CYMBALTA) 20 MG capsule Take 1 capsule (20 mg total) by mouth daily.   fluticasone (FLONASE) 50 MCG/ACT nasal spray Place 2 sprays into both  nostrils daily. (Patient taking differently: Place 2 sprays into both nostrils daily as needed for allergies.)   losartan (COZAAR) 50 MG tablet Take 1 tablet (50 mg total) by mouth daily.   montelukast (SINGULAIR) 10 MG tablet TAKE 1 TABLET BY MOUTH EVERYDAY AT BEDTIME   Multiple Vitamin (MULTIVITAMIN) tablet Take 1 tablet by mouth daily.   polyethylene glycol (MIRALAX) 17 g packet Take 17 g by mouth daily. (Patient taking differently: Take 17 g by mouth daily as needed for moderate constipation.)   Semaglutide, 2 MG/DOSE, (OZEMPIC, 2 MG/DOSE,) 8 MG/3ML SOPN Inject 2 mg into the skin once a week.   tiZANidine (ZANAFLEX) 2 MG tablet Take 2 mg by mouth 3 (three) times daily.   [DISCONTINUED] CONTRAVE 8-90 MG TB12 Take 1 tablet by mouth 2 (two) times daily.   No facility-administered medications prior to visit.   Reviewed past medical and social history.   ROS per HPI above      Objective:  BP (!) 140/82 (BP Location: Left Arm, Patient Position: Sitting)   Pulse 87   Temp 98.2 F (36.8 C) (Temporal)   Resp 18   Ht 5\' 4"  (1.626 m)   Wt 279 lb 3.2 oz (126.6 kg)   LMP 06/15/2010   SpO2 96%  BMI 47.92 kg/m      Physical Exam Vitals and nursing note reviewed.  Cardiovascular:     Rate and Rhythm: Normal rate and regular rhythm.     Pulses: Normal pulses.     Heart sounds: Normal heart sounds.  Pulmonary:     Effort: Pulmonary effort is normal.     Breath sounds: Normal breath sounds.  Musculoskeletal:     Right lower leg: No edema.     Left lower leg: No edema.  Neurological:     Mental Status: She is alert and oriented to person, place, and time.     No results found for any visits on 02/25/23.    Assessment & Plan:    Problem List Items Addressed This Visit     HTN (hypertension), benign - Primary    BP not at goal due to med dose and diet non compliance BP Readings from Last 3 Encounters:  02/25/23 (!) 140/82  01/25/23 133/84  10/21/22 120/80    Advised about  the importance of med and diet compliance. Maintain med dose F/up in 80month      Morbid obesity (HCC)    Denies any adverse effects with contrave 1tab BID. No exercise regimen She continues to struggle to making right food choices Lost 1lb in last 80month Wt Readings from Last 3 Encounters:  02/25/23 279 lb 3.2 oz (126.6 kg)  01/25/23 280 lb 3.2 oz (127.1 kg)  10/21/22 280 lb (127 kg)    Encouraged to add daily exercise and heart healthy diet Maintain med dose F/up in 80month       Relevant Medications   CONTRAVE 8-90 MG TB12   Return in about 8 weeks (around 04/19/2023) for HTN, Weight management (fasting).     Alysia Penna, NP

## 2023-02-25 NOTE — Assessment & Plan Note (Signed)
Denies any adverse effects with contrave 1tab BID. No exercise regimen She continues to struggle to making right food choices Lost 1lb in last 54month Wt Readings from Last 3 Encounters:  02/25/23 279 lb 3.2 oz (126.6 kg)  01/25/23 280 lb 3.2 oz (127.1 kg)  10/21/22 280 lb (127 kg)    Encouraged to add daily exercise and heart healthy diet Maintain med dose F/up in 54month

## 2023-03-02 ENCOUNTER — Encounter: Payer: Self-pay | Admitting: Nurse Practitioner

## 2023-03-02 ENCOUNTER — Ambulatory Visit (INDEPENDENT_AMBULATORY_CARE_PROVIDER_SITE_OTHER): Payer: BC Managed Care – PPO | Admitting: Nurse Practitioner

## 2023-03-02 VITALS — BP 132/90 | HR 95 | Temp 98.2°F | Resp 18 | Wt 278.4 lb

## 2023-03-02 DIAGNOSIS — I10 Essential (primary) hypertension: Secondary | ICD-10-CM | POA: Diagnosis not present

## 2023-03-02 DIAGNOSIS — J029 Acute pharyngitis, unspecified: Secondary | ICD-10-CM | POA: Diagnosis not present

## 2023-03-02 LAB — POCT INFLUENZA A/B
Influenza A, POC: NEGATIVE
Influenza B, POC: NEGATIVE

## 2023-03-02 LAB — POC COVID19 BINAXNOW: SARS Coronavirus 2 Ag: NEGATIVE

## 2023-03-02 LAB — POCT RAPID STREP A (OFFICE): Rapid Strep A Screen: NEGATIVE

## 2023-03-02 MED ORDER — PREDNISONE 20 MG PO TABS: 20.0000 mg | ORAL_TABLET | Freq: Every day | ORAL | 0 refills | Status: DC

## 2023-03-02 MED ORDER — LOSARTAN POTASSIUM 100 MG PO TABS: 100.0000 mg | ORAL_TABLET | Freq: Every day | ORAL | 1 refills | Status: DC

## 2023-03-02 NOTE — Assessment & Plan Note (Signed)
BP not at goal Reports compliance with meds Also took OVER THE COUNTER oral decongestant yesterday BP Readings from Last 3 Encounters:  03/02/23 (!) 132/90  02/25/23 (!) 140/82  01/25/23 133/84    Advised to avoid oral decongestants Increase losartan to 100mg  F/up in 49month

## 2023-03-02 NOTE — Patient Instructions (Addendum)
Increase losartan dose to 100mg  daily Maintain other med doses. Send BP readings via mychart in 2weeks  Laryngitis  Laryngitis is irritation and swelling (inflammation) of your vocal cords. It causes your voice to sound hoarse and may cause you to lose your voice. Depending on the cause, this condition may go away after a short time or may last for more than 3 weeks. Treatment often involves resting your voice and using medicines to soothe your throat. What are the causes? Laryngitis that lasts for a short time may be caused by: An infection caused by a virus. Lots of talking, yelling, or singing. This is also called vocal strain. An infection caused by bacteria. Laryngitis that lasts for more than 3 weeks can be caused by: Lots of talking, yelling, or singing. An injury to the vocal cords. Acid reflux. Allergies. Sinus infection. Mucus draining from the nose down the throat (postnasal drip). Smoking. Drinking too much alcohol. Breathing in chemicals or dust. Having growths on the vocal cords. What increases the risk? Smoking. Drinking too much alcohol. Having allergies. Breathing in fumes at work. What are the signs or symptoms? A change in your voice. It may sound low and hoarse. Loss of voice. Dry cough. Sore throat. Dry throat. Stuffy nose. How is this treated? Treatment depends on what is causing the laryngitis. Usually, treatment includes: Resting your voice. Using medicines to soothe your throat. If your laryngitis is caused by an infection from bacteria, you may need to take antibiotics. If your laryngitis is caused by a growth on your vocal cords, you may need to have a surgery to remove it. Follow these instructions at home: Medicines Take over-the-counter and prescription medicines only as told by your doctor. If you were prescribed an antibiotic medicine, take it as told by your doctor. Do not stop taking it even if you start to feel better. Use throat  lozenges or sprays to soothe your throat as told by your doctor. General instructions  Talk as little as possible. To do this: Avoid whispering. Write instead of talking. Do this until your voice is back to normal. Rinse your mouth (gargle) with a salt water mixture 3-4 times a day or as needed. To make salt water, dissolve -1 tsp (3-6 g) of salt in 1 cup (237 mL) of warm water. Do not swallow this mixture. Drink enough fluid to keep your pee (urine) pale yellow. Breathe in moist air. Use a humidifier if you live in a dry climate. Do not smoke or use any products that contain nicotine or tobacco. If you need help quitting, ask your doctor. Contact a doctor if: You have a fever. Your pain is worse. Your symptoms do not get better in 2 weeks. Get help right away if: You cough up blood. You have trouble swallowing. You have trouble breathing. Summary Laryngitis is inflammation of your vocal cords. This condition causes your voice to sound low and hoarse. Rest your voice by talking as little as possible. Also avoid whispering. Get help right away if you have trouble swallowing or breathing or if you cough up blood. This information is not intended to replace advice given to you by your health care provider. Make sure you discuss any questions you have with your health care provider. Document Revised: 05/27/2020 Document Reviewed: 05/27/2020 Elsevier Patient Education  2024 [employer].

## 2023-03-02 NOTE — Progress Notes (Signed)
Established Patient Visit  Patient: Veronica [name]   DOB: 11-30-1970   52 y.o. Female  MRN: [id number] Visit Date: 03/02/2023  Subjective:    Chief Complaint  Patient presents with   Acute Visit    PT C/O of laryngitis with decrease in voice since Saturday; she is unable to sing or speak clearly. Eye exam report requested from Dr. Fawn Kirk MD.    Sore Throat  This is a new problem. The current episode started yesterday. The problem has been unchanged. There has been no fever. Associated symptoms include congestion, a hoarse voice and swollen glands. Pertinent negatives include no abdominal pain, coughing, diarrhea, drooling, ear discharge, ear pain, headaches, plugged ear sensation, neck pain, shortness of breath, stridor, trouble swallowing or vomiting. She has had no exposure to strep or mono. Treatments tried: decongestant. The treatment provided no relief.  She is requesting oral prednisone. She has to participate in several musical shows for the next 2weeks.  HTN (hypertension), benign BP not at goal Reports compliance with meds Also took OVER THE COUNTER oral decongestant yesterday BP Readings from Last 3 Encounters:  03/02/23 (!) 132/90  02/25/23 (!) 140/82  01/25/23 133/84    Advised to avoid oral decongestants Increase losartan to 100mg  F/up in 87month    Reviewed medical, surgical, and social history today  Medications: Outpatient Medications Prior to Visit  Medication Sig   albuterol (VENTOLIN HFA) 108 (90 Base) MCG/ACT inhaler INHALE 1-2 PUFFS BY MOUTH EVERY 6 HOURS AS NEEDED FOR WHEEZE OR SHORTNESS OF BREATH   Ascorbic Acid (VITAMIN C) 1000 MG tablet Take 1,000 mg by mouth daily.   atorvastatin (LIPITOR) 20 MG tablet Take 1 tablet (20 mg total) by mouth daily.   CELEBREX 200 MG capsule Take 200 mg by mouth daily as needed.   CONTRAVE 8-90 MG TB12 Take 1 tablet by mouth 2 (two) times daily.   cyanocobalamin (VITAMIN B12) 1000 MCG tablet Take 1,000  mcg by mouth daily.   DULoxetine (CYMBALTA) 20 MG capsule Take 1 capsule (20 mg total) by mouth daily.   fluticasone (FLONASE) 50 MCG/ACT nasal spray Place 2 sprays into both nostrils daily. (Patient taking differently: Place 2 sprays into both nostrils daily as needed for allergies.)   montelukast (SINGULAIR) 10 MG tablet TAKE 1 TABLET BY MOUTH EVERYDAY AT BEDTIME   Multiple Vitamin (MULTIVITAMIN) tablet Take 1 tablet by mouth daily.   polyethylene glycol (MIRALAX) 17 g packet Take 17 g by mouth daily. (Patient taking differently: Take 17 g by mouth daily as needed for moderate constipation.)   Semaglutide, 2 MG/DOSE, (OZEMPIC, 2 MG/DOSE,) 8 MG/3ML SOPN Inject 2 mg into the skin once a week.   tiZANidine (ZANAFLEX) 2 MG tablet Take 2 mg by mouth 3 (three) times daily.   [DISCONTINUED] losartan (COZAAR) 50 MG tablet Take 1 tablet (50 mg total) by mouth daily.   No facility-administered medications prior to visit.   Reviewed past medical and social history.   ROS per HPI above      Objective:  BP (!) 132/90 (BP Location: Left Arm, Patient Position: Sitting, Cuff Size: Large)   Pulse 95   Temp 98.2 F (36.8 C) (Temporal)   Resp 18   Wt 278 lb 6.4 oz (126.3 kg)   LMP 06/15/2010   SpO2 97%   BMI 47.79 kg/m      Physical Exam Vitals and nursing note reviewed.  HENT:  Nose: No nasal tenderness, mucosal edema or congestion.     Mouth/Throat:     Pharynx: Uvula midline. Posterior oropharyngeal erythema present. No oropharyngeal exudate, uvula swelling or postnasal drip.     Tonsils: No tonsillar exudate or tonsillar abscesses.  Cardiovascular:     Rate and Rhythm: Normal rate and regular rhythm.     Pulses: Normal pulses.     Heart sounds: Normal heart sounds.  Pulmonary:     Effort: Pulmonary effort is normal.     Breath sounds: Normal breath sounds.  Musculoskeletal:     Right lower leg: No edema.     Left lower leg: No edema.  Skin:    Findings: No erythema or rash.   Neurological:     Mental Status: She is alert.     Results for orders placed or performed in visit on 03/02/23  POC COVID-19  Result Value Ref Range   SARS Coronavirus 2 Ag Negative Negative  POCT Influenza A/B  Result Value Ref Range   Influenza A, POC Negative Negative   Influenza B, POC Negative Negative  POCT rapid strep A  Result Value Ref Range   Rapid Strep A Screen Negative Negative      Assessment & Plan:    Problem List Items Addressed This Visit     HTN (hypertension), benign    BP not at goal Reports compliance with meds Also took OVER THE COUNTER oral decongestant yesterday BP Readings from Last 3 Encounters:  03/02/23 (!) 132/90  02/25/23 (!) 140/82  01/25/23 133/84    Advised to avoid oral decongestants Increase losartan to 100mg  F/up in 43month      Relevant Medications   losartan (COZAAR) 100 MG tablet   Other Visit Diagnoses     Acute pharyngitis, unspecified etiology    -  Primary   Relevant Medications   predniSONE (DELTASONE) 20 MG tablet   Other Relevant Orders   POC COVID-19 (Completed)   POCT Influenza A/B (Completed)   POCT rapid strep A (Completed)   Primary hypertension       Relevant Medications   losartan (COZAAR) 100 MG tablet     Advised about the effects of oral prednisone of glucose. Advised about the importance of voice resting, use of lozenges, warm salt water gaggles and oral hydration.  Return in about 4 weeks (around 03/30/2023) for HTN.     Alysia Penna, NP

## 2023-03-17 ENCOUNTER — Other Ambulatory Visit: Payer: Self-pay | Admitting: Nurse Practitioner

## 2023-03-19 ENCOUNTER — Other Ambulatory Visit (HOSPITAL_COMMUNITY): Payer: BC Managed Care – PPO | Attending: Nurse Practitioner

## 2023-04-23 ENCOUNTER — Other Ambulatory Visit (HOSPITAL_COMMUNITY): Payer: Self-pay

## 2023-05-07 ENCOUNTER — Encounter: Payer: Self-pay | Admitting: Nurse Practitioner

## 2023-05-07 ENCOUNTER — Other Ambulatory Visit: Payer: Self-pay | Admitting: Nurse Practitioner

## 2023-05-07 DIAGNOSIS — N6489 Other specified disorders of breast: Secondary | ICD-10-CM

## 2023-05-13 ENCOUNTER — Telehealth: Payer: Self-pay | Admitting: Pharmacy Technician

## 2023-05-13 ENCOUNTER — Other Ambulatory Visit (HOSPITAL_COMMUNITY): Payer: Self-pay

## 2023-05-13 NOTE — Telephone Encounter (Signed)
Pharmacy Patient Advocate Encounter   Received notification from CoverMyMeds that prior authorization for [facility] 2MG /DOSE is required/requested.   Insurance verification completed.   The patient is insured through [facility] .   Per test claim: The current 84 day co-pay is, $141.00.  No PA needed at this time. This test claim was processed through [facility]- copay amounts may vary at other pharmacies due to pharmacy/plan contracts, or as the patient moves through the different stages of their insurance plan.    Copay $74.99 after evoucher

## 2023-05-14 ENCOUNTER — Other Ambulatory Visit (HOSPITAL_COMMUNITY): Payer: Self-pay

## 2023-05-31 ENCOUNTER — Encounter: Payer: Self-pay | Admitting: Internal Medicine

## 2023-05-31 ENCOUNTER — Ambulatory Visit: Payer: Self-pay | Admitting: Nurse Practitioner

## 2023-05-31 ENCOUNTER — Ambulatory Visit (INDEPENDENT_AMBULATORY_CARE_PROVIDER_SITE_OTHER): Admitting: Internal Medicine

## 2023-05-31 VITALS — BP 126/88 | HR 83 | Temp 98.1°F | Ht 64.0 in | Wt 282.6 lb

## 2023-05-31 DIAGNOSIS — R599 Enlarged lymph nodes, unspecified: Secondary | ICD-10-CM

## 2023-05-31 DIAGNOSIS — H9201 Otalgia, right ear: Secondary | ICD-10-CM | POA: Diagnosis not present

## 2023-05-31 MED ORDER — AMOXICILLIN-POT CLAVULANATE 875-125 MG PO TABS: 1.0000 | ORAL_TABLET | Freq: Two times a day (BID) | ORAL | 0 refills | Status: AC

## 2023-05-31 MED ORDER — FLUCONAZOLE 150 MG PO TABS: ORAL_TABLET | ORAL | 0 refills | Status: DC

## 2023-05-31 MED ORDER — PREDNISONE 20 MG PO TABS: 20.0000 mg | ORAL_TABLET | Freq: Every day | ORAL | 0 refills | Status: AC

## 2023-05-31 NOTE — Progress Notes (Signed)
 [facility] [address] [place] [address] Dept: [phone] Dept Fax: [phone]  Acute Care Office Visit  Subjective:   Veronica [name] 29-Apr-1970 05/31/2023  Chief Complaint  Patient presents with   Ear Pain    Started last week swollen lymph node     HPI: Discussed the use of AI scribe software for clinical note transcription with the patient, who gave verbal consent to proceed.  History of Present Illness   The patient presents with a week-long history of right ear discomfort, described as feeling 'like I was under water.' She attempted to alleviate the sensation by yawning and 'pushing my ears in and out,' but without success. She also reports concurrent allergy symptoms, which she attributes to high tree pollen levels.  On the Friday prior to the visit, the patient noticed enlargement of her right-sided lymph nodes, accompanied by sharp pains extending from the ear to under the chin. She managed the pain with Tylenol and a heated pad, which she applied intermittently throughout the weekend. Despite these measures, the pain intensified on Sunday night.  The patient denies fever, but has been taking increased doses of vitamins C and D in an attempt to boost her immune system. No drooling, no trismus.   In addition to the ear discomfort, the patient mentions pain in her jaw. She has a history of diabetes, which is managed with Ozempic (semaglutide) 2mg .       The following portions of the patient's history were reviewed and updated as appropriate: past medical history, past surgical history, family history, social history, allergies, medications, and problem list.   Patient Active Problem List   Diagnosis Date Noted   Special screening for malignant neoplasms, colon 03/02/2022   Hyperlipidemia associated with type 2 diabetes mellitus (HCC) 09/29/2021   Vaginal septum 09/16/2021   Vitreomacular adhesion of both  eyes 09/11/2021   Moderate persistent asthma with exacerbation 07/11/2021   HTN (hypertension), benign 07/11/2021   DM (diabetes mellitus) (HCC) 07/02/2021   Morbid obesity (HCC) 07/01/2021   Pain in joint involving multiple sites 03/04/2021   History of colon polyps 03/04/2021   OSA (obstructive sleep apnea) 06/13/2020   GAD (generalized anxiety disorder) 04/10/2020   Memory difficulty 04/10/2020   Asthma 04/09/2020   S/P abdominal supracervical subtotal hysterectomy 04/09/2020   Insomnia 04/09/2020   Past Medical History:  Diagnosis Date   Anxiety    Arthritis    Asthma    DM (diabetes mellitus) (HCC)    GERD (gastroesophageal reflux disease)    HTN (hypertension)    Obesity    Sleep apnea    Vaginal bleeding 05/12/2021   Past Surgical History:  Procedure Laterality Date   CESAREAN SECTION     x 3   CHOLECYSTECTOMY     COLONOSCOPY WITH PROPOFOL N/A 03/02/2022   Procedure: COLONOSCOPY WITH PROPOFOL;  Surgeon: Tressia Danas, MD;  Location: [facility];  Service: Gastroenterology;  Laterality: N/A;   IR ABLATE LIVER CRYOABLATION  08/21/2021   IR ABLATE LIVER CRYOABLATION  08/21/2021   IR ABLATE LIVER CRYOABLATION  08/21/2021   IR ABLATE LIVER CRYOABLATION  09/17/2021   IR ABLATE LIVER CRYOABLATION  09/17/2021   IR ABLATE LIVER CRYOABLATION  09/17/2021   IR ABLATE LIVER CRYOABLATION  09/17/2021   IR RADIOLOGIST EVAL & MGMT  08/04/2021   IR RADIOLOGIST EVAL & MGMT  09/11/2021   IR RADIOLOGIST EVAL & MGMT  10/13/2021   LAPAROSCOPIC GASTRIC BANDING  SUPRACERVICAL ABDOMINAL HYSTERECTOMY  2017   Family History  Problem Relation Age of Onset   Diabetes Mother    Hypertension Mother    Asthma Mother    Diabetes Father    Hypertension Father    Throat cancer Father    Asthma Sister    Asthma Brother    Asthma Brother    Pulmonary embolism Brother    Stomach cancer Maternal Grandmother    Lung cancer Maternal Grandfather    Other Daughter        prediabetes    Colon cancer Neg Hx    Rectal cancer Neg Hx     Current Outpatient Medications:    albuterol (VENTOLIN HFA) 108 (90 Base) MCG/ACT inhaler, INHALE 1-2 PUFFS BY MOUTH EVERY 6 HOURS AS NEEDED FOR WHEEZE OR SHORTNESS OF BREATH, Disp: 8.5 each, Rfl: 1   amoxicillin-clavulanate (AUGMENTIN) 875-125 MG tablet, Take 1 tablet by mouth 2 (two) times daily for 7 days., Disp: 14 tablet, Rfl: 0   Ascorbic Acid (VITAMIN C) 1000 MG tablet, Take 1,000 mg by mouth daily., Disp: , Rfl:    atorvastatin (LIPITOR) 20 MG tablet, Take 1 tablet (20 mg total) by mouth daily., Disp: 90 tablet, Rfl: 3   CELEBREX 200 MG capsule, Take 200 mg by mouth daily as needed., Disp: , Rfl:    CONTRAVE 8-90 MG TB12, Take 1 tablet by mouth 2 (two) times daily., Disp: 60 tablet, Rfl: 0   cyanocobalamin (VITAMIN B12) 1000 MCG tablet, Take 1,000 mcg by mouth daily., Disp: , Rfl:    DULoxetine (CYMBALTA) 20 MG capsule, Take 1 capsule (20 mg total) by mouth daily., Disp: 90 capsule, Rfl: 3   fluconazole (DIFLUCAN) 150 MG tablet, Take 1 tablet by mouth once. Repeat dose in 3 days if symptoms persist., Disp: 2 tablet, Rfl: 0   fluticasone (FLONASE) 50 MCG/ACT nasal spray, Place 2 sprays into both nostrils daily. (Patient taking differently: Place 2 sprays into both nostrils daily as needed for allergies.), Disp: 16 g, Rfl: 6   losartan (COZAAR) 100 MG tablet, Take 1 tablet (100 mg total) by mouth daily., Disp: 90 tablet, Rfl: 1   montelukast (SINGULAIR) 10 MG tablet, TAKE 1 TABLET BY MOUTH EVERYDAY AT BEDTIME, Disp: 90 tablet, Rfl: 3   Multiple Vitamin (MULTIVITAMIN) tablet, Take 1 tablet by mouth daily., Disp: , Rfl:    polyethylene glycol (MIRALAX) 17 g packet, Take 17 g by mouth daily. (Patient taking differently: Take 17 g by mouth daily as needed for moderate constipation.), Disp: 14 each, Rfl: 0   predniSONE (DELTASONE) 20 MG tablet, Take 1 tablet (20 mg total) by mouth daily for 5 days., Disp: 5 tablet, Rfl: 0   Semaglutide, 2 MG/DOSE,  (OZEMPIC, 2 MG/DOSE,) 8 MG/3ML SOPN, Inject 2 mg into the skin once a week., Disp: 9 mL, Rfl: 1   tiZANidine (ZANAFLEX) 2 MG tablet, Take 2 mg by mouth 3 (three) times daily., Disp: , Rfl:  Allergies  Allergen Reactions   Peanut-Containing Drug Products Anaphylaxis and Hives    All nuts    Soy Allergy (Obsolete) Anaphylaxis   Percocet [Oxycodone-Acetaminophen] Hives and Swelling   Shellfish Allergy Hives and Swelling     ROS: A complete ROS was performed with pertinent positives/negatives noted in the HPI. The remainder of the ROS are negative.    Objective:   Today's Vitals   05/31/23 1451  BP: 126/88  Pulse: 83  Temp: 98.1 F (36.7 C)  TempSrc: Temporal  SpO2: 96%  Weight: 282 lb 9.6 oz (128.2 kg)  Height: 5\' 4"  (1.626 m)    GENERAL: Well-appearing, in NAD. Well nourished.  SKIN: Pink, warm and dry. No rash.  HEENT:    HEAD: Normocephalic, non-traumatic.  EYES: Conjunctive pink without exudate. PERRL.  EARS: External ear w/o redness, swelling, masses, or lesions. EAC clear. TM's intact bilaterally, Right TM cloudy with slight redness and bulging, appropriate landmarks visualized. Mild Tenderness to post-auricular region, no warmth or swelling.  NOSE: Septum midline w/o deformity. Nares patent, mucosa pink and non-inflamed w/o drainage. No sinus tenderness.  THROAT: Uvula midline. Oropharynx clear. Tonsils non-inflamed w/o exudate. Mucus membranes pink and moist.  NECK: Trachea midline. Full ROM w/o pain or tenderness. Swollen, tender right submandibular lymph node RESPIRATORY: Chest wall symmetrical. Respirations even and non-labored. Breath sounds clear to auscultation bilaterally.  CARDIAC: S1, S2 present, regular rate and rhythm. Peripheral pulses 2+ bilaterally.  EXTREMITIES: Without clubbing, cyanosis, or edema.  NEUROLOGIC: Steady, even gait.  PSYCH/MENTAL STATUS: Alert, oriented x 3. Cooperative, appropriate mood and affect.    No results found for any visits on  05/31/23.    Assessment & Plan:  Assessment and Plan    Right Ear Pain and Lymph node swelling -Start Augmentin 875-125mg  PO BID x 7 days  - Start Prednisone 20mg  PO daily x 5 days -Consider short-term use of ibuprofen for inflammation and pain relief. -Continue use of heating pad. -Follow up in one week or sooner if symptoms worsen.      Meds ordered this encounter  Medications   amoxicillin-clavulanate (AUGMENTIN) 875-125 MG tablet    Sig: Take 1 tablet by mouth 2 (two) times daily for 7 days.    Dispense:  14 tablet    Refill:  0    Supervising Provider:   Garnette Gunner [[id number]]   predniSONE (DELTASONE) 20 MG tablet    Sig: Take 1 tablet (20 mg total) by mouth daily for 5 days.    Dispense:  5 tablet    Refill:  0    Supervising Provider:   Garnette Gunner [[id number]]   fluconazole (DIFLUCAN) 150 MG tablet    Sig: Take 1 tablet by mouth once. Repeat dose in 3 days if symptoms persist.    Dispense:  2 tablet    Refill:  0    Supervising Provider:   Garnette Gunner [[id number]]   No orders of the defined types were placed in this encounter.  Lab Orders  No laboratory test(s) ordered today   No images are attached to the encounter or orders placed in the encounter.  Return in about 1 week (around 06/07/2023) for ear pain, or sooner if symptoms worsen or fail to improve.   Salvatore Decent, FNP

## 2023-05-31 NOTE — Telephone Encounter (Signed)
  Chief Complaint: earache  Symptoms: pain, stuffy  Frequency: constant without tylenol  Disposition: [] ED /[] Urgent Care (no appt availability in office) / [x] Appointment(In office/virtual)/ []  [facility]/ [] Home Care/ [] Refused Recommended Disposition /[]  Mobile Bus/ []  Follow-up with PCP Additional Notes: Pt complaining of earache since Friday. Pt states it feels like she is drowning and can't unclog her ear. Pt stated there is  lump/swollen gland behind right ear. Pt stated with tylenol pain is controlled. Pt has seasonal allergies and feels this is the cause. Pt has app today at 1440. RN gave care advice and pt verbalized understanding.              Copied from CRM [id number]. Topic: Clinical - Red Word Triage >> May 31, 2023  8:23 AM Pascal Lux wrote: Red Word that prompted transfer to Nurse Triage: Ear infection, severe pain, swollen glands on right side under chin. Reason for Disposition  Earache persists > 1 hour  Answer Assessment - Initial Assessment Questions 1. LOCATION: "Which ear is involved?"       Right  2. SENSATION: "Describe how the ear feels." (e.g. stuffy, full, plugged)."      Plugged and painful  3. ONSET:  "When did the ear symptoms start?"       Last week  4. PAIN: "Do you also have an earache?" If Yes, ask: "How bad is it?" (Scale 1-10; or mild, moderate, severe)     Yes, mild to 8-9 5. CAUSE: "What do you think is causing the ear congestion?"     Allergies  6. URI: "Do you have a runny nose or cough?"      Runny nose  7. NASAL ALLERGIES: "Are there symptoms of hay fever, such as sneezing or a clear nasal discharge?"     Yes, clear  Protocols used: Ear - Congestion-A-AH

## 2023-06-01 ENCOUNTER — Ambulatory Visit: Admitting: Family Medicine

## 2023-06-02 ENCOUNTER — Ambulatory Visit
Admission: RE | Admit: 2023-06-02 | Discharge: 2023-06-02 | Disposition: A | Discharge status: Home / Self Care | Payer: BC Managed Care – PPO | Source: Ambulatory Visit | Attending: Nurse Practitioner | Admitting: Nurse Practitioner

## 2023-06-02 DIAGNOSIS — N6489 Other specified disorders of breast: Secondary | ICD-10-CM

## 2023-06-02 DIAGNOSIS — R928 Other abnormal and inconclusive findings on diagnostic imaging of breast: Secondary | ICD-10-CM | POA: Diagnosis not present

## 2023-06-04 ENCOUNTER — Other Ambulatory Visit (HOSPITAL_COMMUNITY): Payer: Self-pay

## 2023-06-08 ENCOUNTER — Other Ambulatory Visit (HOSPITAL_COMMUNITY): Payer: Self-pay | Attending: Medical Genetics

## 2023-06-10 ENCOUNTER — Other Ambulatory Visit (HOSPITAL_COMMUNITY): Payer: Self-pay

## 2023-06-15 ENCOUNTER — Other Ambulatory Visit: Payer: Self-pay | Admitting: Nurse Practitioner

## 2023-06-15 DIAGNOSIS — I10 Essential (primary) hypertension: Secondary | ICD-10-CM

## 2023-06-15 NOTE — Telephone Encounter (Signed)
 Medication: Losartan (Cozaar) 100 mg  Directions: Take 1 tablet by mouth daily  Last given: 03/02/23 Number refills: 1 Last o/v: 05/31/23 (Acute), 02/25/23 Follow up: 8 week f/u (around 04/19/23)    Labs: 01/25/23

## 2023-06-23 DIAGNOSIS — M1712 Unilateral primary osteoarthritis, left knee: Secondary | ICD-10-CM | POA: Diagnosis not present

## 2023-06-23 DIAGNOSIS — M170 Bilateral primary osteoarthritis of knee: Secondary | ICD-10-CM | POA: Diagnosis not present

## 2023-06-23 DIAGNOSIS — M1711 Unilateral primary osteoarthritis, right knee: Secondary | ICD-10-CM | POA: Diagnosis not present

## 2023-06-24 ENCOUNTER — Other Ambulatory Visit (HOSPITAL_COMMUNITY): Payer: Self-pay

## 2023-07-05 ENCOUNTER — Telehealth: Payer: Self-pay | Admitting: Nurse Practitioner

## 2023-07-05 ENCOUNTER — Encounter: Payer: Self-pay | Admitting: Nurse Practitioner

## 2023-07-05 ENCOUNTER — Other Ambulatory Visit

## 2023-07-05 ENCOUNTER — Ambulatory Visit (INDEPENDENT_AMBULATORY_CARE_PROVIDER_SITE_OTHER): Admitting: Nurse Practitioner

## 2023-07-05 VITALS — BP 118/78 | HR 70 | Temp 98.2°F | Wt 286.2 lb

## 2023-07-05 DIAGNOSIS — E785 Hyperlipidemia, unspecified: Secondary | ICD-10-CM | POA: Diagnosis not present

## 2023-07-05 DIAGNOSIS — H2513 Age-related nuclear cataract, bilateral: Secondary | ICD-10-CM | POA: Diagnosis not present

## 2023-07-05 DIAGNOSIS — I10 Essential (primary) hypertension: Secondary | ICD-10-CM

## 2023-07-05 DIAGNOSIS — Z7985 Long-term (current) use of injectable non-insulin antidiabetic drugs: Secondary | ICD-10-CM

## 2023-07-05 DIAGNOSIS — Z006 Encounter for examination for normal comparison and control in clinical research program: Secondary | ICD-10-CM

## 2023-07-05 DIAGNOSIS — H43823 Vitreomacular adhesion, bilateral: Secondary | ICD-10-CM | POA: Diagnosis not present

## 2023-07-05 DIAGNOSIS — E1169 Type 2 diabetes mellitus with other specified complication: Secondary | ICD-10-CM | POA: Diagnosis not present

## 2023-07-05 DIAGNOSIS — E119 Type 2 diabetes mellitus without complications: Secondary | ICD-10-CM | POA: Diagnosis not present

## 2023-07-05 LAB — POCT GLYCOSYLATED HEMOGLOBIN (HGB A1C): Hemoglobin A1C: 5.9 % — AB (ref 4.0–5.6)

## 2023-07-05 MED ORDER — TIRZEPATIDE 5 MG/0.5ML ~~LOC~~ SOAJ: 5.0000 mg | SUBCUTANEOUS | 1 refills | Status: DC

## 2023-07-05 MED ORDER — CONTRAVE 8-90 MG PO TB12: 1.0000 | ORAL_TABLET | Freq: Two times a day (BID) | ORAL | 1 refills | Status: DC

## 2023-07-05 NOTE — Progress Notes (Signed)
 Established Patient Visit  Patient: Veronica [name]   DOB: 1970/10/31   53 y.o. Female  MRN: [id number] Visit Date: 07/05/2023  Subjective:    Chief Complaint  Patient presents with   Medical Management of Chronic Issues    3 month follow up A1c. Discuss mounjaro.    HPI Morbid obesity (HCC) Out of contrave x 2months. Persistent stress eating. No exercise regimen Gained 8lbs in last 3months Current use of ozempic 2mg  weekly Wt Readings from Last 3 Encounters:  07/05/23 286 lb 3.2 oz (129.8 kg)  05/31/23 282 lb 9.6 oz (128.2 kg)  03/02/23 278 lb 6.4 oz (126.3 kg)    Advised to start daily low impact exercise and mediterranean diet. Refill contrave Advised to maintain monthly f/up appt  DM (diabetes mellitus) (HCC) No adverse effects with ozempic 2mg  weekly Advised to schedule DIABETES eye exam.  Repeat hgbA1c: 5.9% improved She opted to switch from ozempic 2mg  to mounjaro 5mg  F/up in 3months  BP Readings from Last 3 Encounters:  07/05/23 118/78  05/31/23 126/88  03/02/23 (!) 132/90    Reviewed medical, surgical, and social history today  Medications: Outpatient Medications Prior to Visit  Medication Sig   albuterol (VENTOLIN HFA) 108 (90 Base) MCG/ACT inhaler INHALE 1-2 PUFFS BY MOUTH EVERY 6 HOURS AS NEEDED FOR WHEEZE OR SHORTNESS OF BREATH   Ascorbic Acid (VITAMIN C) 1000 MG tablet Take 1,000 mg by mouth daily.   atorvastatin (LIPITOR) 20 MG tablet Take 1 tablet (20 mg total) by mouth daily.   CELEBREX 200 MG capsule Take 200 mg by mouth daily as needed.   cyanocobalamin (VITAMIN B12) 1000 MCG tablet Take 1,000 mcg by mouth daily.   DULoxetine (CYMBALTA) 20 MG capsule Take 1 capsule (20 mg total) by mouth daily.   fluconazole (DIFLUCAN) 150 MG tablet Take 1 tablet by mouth once. Repeat dose in 3 days if symptoms persist.   fluticasone (FLONASE) 50 MCG/ACT nasal spray Place 2 sprays into both nostrils daily. (Patient taking differently: Place 2  sprays into both nostrils daily as needed for allergies.)   losartan (COZAAR) 100 MG tablet TAKE 1 TABLET BY MOUTH EVERY DAY   montelukast (SINGULAIR) 10 MG tablet TAKE 1 TABLET BY MOUTH EVERYDAY AT BEDTIME   Multiple Vitamin (MULTIVITAMIN) tablet Take 1 tablet by mouth daily.   polyethylene glycol (MIRALAX) 17 g packet Take 17 g by mouth daily. (Patient taking differently: Take 17 g by mouth daily as needed for moderate constipation.)   tiZANidine (ZANAFLEX) 2 MG tablet Take 2 mg by mouth 3 (three) times daily.   [DISCONTINUED] CONTRAVE 8-90 MG TB12 Take 1 tablet by mouth 2 (two) times daily.   [DISCONTINUED] Semaglutide, 2 MG/DOSE, (OZEMPIC, 2 MG/DOSE,) 8 MG/3ML SOPN Inject 2 mg into the skin once a week.   No facility-administered medications prior to visit.   Reviewed past medical and social history.   ROS per HPI above      Objective:  BP 118/78   Pulse 70   Temp 98.2 F (36.8 C) (Temporal)   Wt 286 lb 3.2 oz (129.8 kg)   LMP 06/15/2010   SpO2 98%   BMI 49.13 kg/m      Physical Exam Vitals and nursing note reviewed.  Constitutional:      Appearance: She is obese.  Cardiovascular:     Rate and Rhythm: Normal rate and regular rhythm.     Pulses: Normal  pulses.     Heart sounds: Normal heart sounds.  Pulmonary:     Effort: Pulmonary effort is normal.     Breath sounds: Normal breath sounds.  Neurological:     Mental Status: She is alert and oriented to person, place, and time.     Results for orders placed or performed in visit on 07/05/23  POCT glycosylated hemoglobin (Hb A1C)  Result Value Ref Range   Hemoglobin A1C 5.9 (A) 4.0 - 5.6 %   HbA1c POC (<> result, manual entry)     HbA1c, POC (prediabetic range)     HbA1c, POC (controlled diabetic range)        Assessment & Plan:    Problem List Items Addressed This Visit     DM (diabetes mellitus) (HCC) - Primary   No adverse effects with ozempic 2mg  weekly Advised to schedule DIABETES eye exam.  Repeat  hgbA1c: 5.9% improved She opted to switch from ozempic 2mg  to mounjaro 5mg  F/up in 3months      Relevant Medications   tirzepatide (MOUNJARO) 5 MG/0.5ML Pen   Other Relevant Orders   POCT glycosylated hemoglobin (Hb A1C) (Completed)   HTN (hypertension), benign   Relevant Medications   tirzepatide (MOUNJARO) 5 MG/0.5ML Pen   Hyperlipidemia associated with type 2 diabetes mellitus (HCC)   Relevant Medications   tirzepatide (MOUNJARO) 5 MG/0.5ML Pen   Morbid obesity (HCC)   Out of contrave x 2months. Persistent stress eating. No exercise regimen Gained 8lbs in last 3months Current use of ozempic 2mg  weekly Wt Readings from Last 3 Encounters:  07/05/23 286 lb 3.2 oz (129.8 kg)  05/31/23 282 lb 9.6 oz (128.2 kg)  03/02/23 278 lb 6.4 oz (126.3 kg)    Advised to start daily low impact exercise and mediterranean diet. Refill contrave Advised to maintain monthly f/up appt      Relevant Medications   CONTRAVE 8-90 MG TB12   tirzepatide (MOUNJARO) 5 MG/0.5ML Pen   Return in about 4 weeks (around 08/02/2023) for Weight management.     Kathrene Parents, NP

## 2023-07-05 NOTE — Assessment & Plan Note (Addendum)
 Out of contrave x 2months. Persistent stress eating. No exercise regimen Gained 8lbs in last 3months Current use of ozempic 2mg  weekly Wt Readings from Last 3 Encounters:  07/05/23 286 lb 3.2 oz (129.8 kg)  05/31/23 282 lb 9.6 oz (128.2 kg)  03/02/23 278 lb 6.4 oz (126.3 kg)    Advised to start daily low impact exercise and mediterranean diet. Refill contrave Advised to maintain monthly f/up appt

## 2023-07-05 NOTE — Assessment & Plan Note (Signed)
 No adverse effects with ozempic 2mg  weekly Advised to schedule DIABETES eye exam.  Repeat hgbA1c: 5.9% improved She opted to switch from ozempic 2mg  to mounjaro 5mg  F/up in 3months

## 2023-07-05 NOTE — Telephone Encounter (Signed)
 Pt is wanting to know if she can take both scripts, CONTRAVE 8-90 MG TB12 [[id number]]  and tirzepatide (MOUNJARO) 5 MG/0.5ML Pen [[id number]] at the same time.  Please advise pt at [phone]

## 2023-07-05 NOTE — Patient Instructions (Signed)
How to Increase Your Level of Physical Activity Getting regular physical activity is important for your overall health and well-being. Most people do not get enough exercise. There are easy ways to increase your level of physical activity, even if you have not been very active in the past or if you are just starting out. What are the benefits of physical activity? Physical activity has many short-term and long-term benefits. Being active on a regular basis can improve your physical and mental health as well as provide other benefits. Physical health benefits Helping you lose weight or maintain a healthy weight. Strengthening your muscles and bones. Reducing your risk of certain long-term (chronic) diseases, including heart disease, cancer, and diabetes. Being able to move around more easily and for longer periods of time without getting tired (increased endurance or stamina). Improving your ability to fight off illness (enhanced immunity). Being able to sleep better. Helping you stay healthy as you get older, including: Helping you stay mobile, or capable of walking and moving around. Preventing accidents, such as falls. Increasing life expectancy. Mental health benefits Boosting your mood and improving your self-esteem. Lowering your chance of having mental health problems, such as depression or anxiety. Helping you feel good about your body. Other benefits Finding new sources of fun and enjoyment. Meeting new people who share a common interest. Before you begin If you have a chronic illness or have not been active for a while, check with your health care provider about how to get started. Ask your health care provider what activities are safe for you. Start out slowly. Walking or doing some simple chair exercises is a good place to start, especially if you have not been active before or for a long time. Set goals that you can work toward. Ask your health care provider how much exercise is  best for you. In general, most adults should: Do moderate-intensity exercise for at least 150 minutes each week (30 minutes on most days of the week) or vigorous exercise for at least 75 minutes each week, or a combination of these. Moderate-intensity exercise can include walking at a quick pace, biking, yoga, water aerobics, or gardening. Vigorous exercise involves activities that take more effort, such as jogging or running, playing sports, swimming laps, or jumping rope. Do strength exercises on at least 2 days each week. This can include weight lifting, body weight exercises, and resistance-band exercises. How to be more physically active Make a plan  Try to find activities that you enjoy. You are more likely to commit to an exercise routine if it does not feel like a chore. If you have bone or joint problems, choose low-impact exercises, like walking or swimming. Use these tips for being successful with an exercise plan: Find a workout partner for accountability. Join a group or class, such as an aerobics class, cycling class, or sports team. Make family time active. Go for a walk, bike, or swim. Include a variety of exercises each week. Consider using a fitness tracker, such as a mobile phone app or a device worn like a watch, that will count the number of steps you take each day. Many people strive to reach 10,000 steps a day. Find ways to be active in your daily routines Besides your formal exercise plans, you can find ways to do physical activity during your daily routines, such as: Walking or biking to work or to the store. Taking the stairs instead of the elevator. Parking farther away from the door at work  or at the store. Planning walking meetings. Walking around while you are on the phone. Where to find more information Centers for Disease Control and Prevention: [employer].pt President's Council on Fitness, Sports & Nutrition: www.fitness.gov ChooseMyPlate:  http://www.harvey.com/ Contact a health care provider if: You have headaches, muscle aches, or joint pain that is concerning. You feel dizzy or light-headed while exercising. You faint. You feel your heart skipping, racing, or fluttering. You have chest pain while exercising. Summary Exercise benefits your mind and body at any age, even if you are just starting out. If you have a chronic illness or have not been active for a while, check with your health care provider before increasing your physical activity. Choose activities that are safe and enjoyable for you. Ask your health care provider what activities are safe for you. Start slowly. Tell your health care provider if you have problems as you start to increase your activity level. This information is not intended to replace advice given to you by your health care provider. Make sure you discuss any questions you have with your health care provider. Document Revised: 07/05/2020 Document Reviewed: 07/05/2020 Elsevier Patient Education  2024 [employer].

## 2023-07-16 ENCOUNTER — Telehealth: Payer: Self-pay

## 2023-07-16 ENCOUNTER — Other Ambulatory Visit (HOSPITAL_COMMUNITY): Payer: Self-pay

## 2023-07-16 NOTE — Telephone Encounter (Addendum)
 Pharmacy Patient Advocate Encounter   Received notification from CoverMyMeds that prior authorization for Ozempic  (2 MG/DOSE) 8MG /3ML pen-injectors is required/requested.   Therapy change from Ozempic  to Mounjaro  per office visit from 07/05/2023. No PA need at this time.[facility] to inform of change.

## 2023-07-19 ENCOUNTER — Telehealth: Payer: Self-pay

## 2023-07-19 LAB — GENECONNECT MOLECULAR SCREEN: Genetic Analysis Overall Interpretation: NEGATIVE

## 2023-07-19 NOTE — Telephone Encounter (Signed)
 Pharmacy Patient Advocate Encounter   Received notification from CoverMyMeds that prior authorization for Mounjaro  5MG /0.5ML auto-injectors is required/requested.   Insurance verification completed.   The patient is insured through [facility] .   Per test claim: PA required; PA submitted to above mentioned insurance via CoverMyMeds Key/confirmation #/EOC [id number] Status is pending

## 2023-07-20 ENCOUNTER — Other Ambulatory Visit: Payer: Self-pay | Admitting: Nurse Practitioner

## 2023-07-20 DIAGNOSIS — E1169 Type 2 diabetes mellitus with other specified complication: Secondary | ICD-10-CM

## 2023-07-20 MED ORDER — SEMAGLUTIDE (2 MG/DOSE) 8 MG/3ML ~~LOC~~ SOPN: 2.0000 mg | PEN_INJECTOR | SUBCUTANEOUS | 1 refills | Status: DC

## 2023-07-20 NOTE — Telephone Encounter (Signed)
 Called patient and made aware that insurance denied Mounjaro . Per Soyla Duverney patient was instructed to resume Ozempic   mg/dose, 8 mg/mL pen to inject  2 mg into skin once a week. She asked that she thought that the Mounjaro  was being started to help with her diabetes and better weight management? I informed her that I will pass this . question along to [place]. She thanked me for calling and stated that she will finish the current Mounjaro  and start the Ozempic  once completed. I thanked her for taking my call and look for ward to speaking with her again and/or seeing her in person.

## 2023-07-20 NOTE — Telephone Encounter (Signed)
 Pharmacy Patient Advocate Encounter  Received notification from [facility] that Prior Authorization for Mounjaro  5MG /0.5ML auto-injectors has been DENIED.  Full denial letter will be uploaded to the media tab. See denial reason below.  This medication may be approved for the treatment of type 2 diabetes (a condition with high blood sugar). Medical records must be sent in for review. The member must meet one of the following:  Have tried a medication containing metformin, sulfonylureas (such as glipizide or glyburide), or insulin and it did not work or was not tolerated Have a medical reason why they cannot take metformin The member is currently being treated with the medication within the past 90 days and is at risk of harm if the medication is changed The member is being treated for, or is at high risk of, heart failure, chronic kidney disease, or atherosclerotic cardiovascular disease (ASCVD, high cholesterol disorder).  PA #/Case ID/Reference #: [id number]

## 2023-07-20 NOTE — Assessment & Plan Note (Signed)
 Mounjaro  denied Resume ozempic  2mg  weekly

## 2023-07-21 NOTE — Telephone Encounter (Signed)
 Called patient and informed her of [place]'s response to her question. She thanked me for calling her back with that information.

## 2023-08-06 ENCOUNTER — Other Ambulatory Visit (HOSPITAL_COMMUNITY): Payer: Self-pay

## 2023-08-06 ENCOUNTER — Encounter: Payer: Self-pay | Admitting: Nurse Practitioner

## 2023-08-06 ENCOUNTER — Ambulatory Visit (INDEPENDENT_AMBULATORY_CARE_PROVIDER_SITE_OTHER): Admitting: Nurse Practitioner

## 2023-08-06 ENCOUNTER — Telehealth: Payer: Self-pay

## 2023-08-06 NOTE — Assessment & Plan Note (Addendum)
 No weight loss noted Admits she is only taking med once a day, and has not made any lifestyle modifications. Wt Readings from Last 3 Encounters:  08/06/23 288 lb 6.4 oz (130.8 kg)  07/05/23 286 lb 3.2 oz (129.8 kg)  05/31/23 282 lb 9.6 oz (128.2 kg)    Advised about the importance of med dose compliance, in combination to lifestyle modification. Advised contrave  will be discontinued if no weight loss noted in the next 27month. She verbalized understanding Maintain med dose F/up in 27month

## 2023-08-06 NOTE — Patient Instructions (Signed)
 Maintain Heart healthy diet and daily exercise. Maintain current medications.  Mediterranean Diet A Mediterranean diet is based on the traditions of countries on the [employer]. It focuses on eating more: Fruits and vegetables. Whole grains, beans, nuts, and seeds. Heart-healthy fats. These are fats that are good for your heart. It involves eating less: Dairy. Meat and eggs. Processed foods with added sugar, salt, and fat. This type of diet can help prevent certain conditions. It can also improve outcomes if you have a long-term (chronic) disease, such as kidney or heart disease. What are tips for following this plan? Reading food labels Check packaged foods for: The serving size. For foods such as rice and pasta, the serving size is the amount of cooked product, not dry. The total fat. Avoid foods with saturated fat or trans fat. Added sugars, such as corn syrup. Shopping  Try to have a balanced diet. Buy a variety of foods, such as: Fresh fruits and vegetables. You may be able to get these from local farmers markets. You can also buy them frozen. Grains, beans, nuts, and seeds. Some of these can be bought in bulk. Fresh seafood. Poultry and eggs. Low-fat dairy products. Buy whole ingredients instead of foods that have already been packaged. If you can't get fresh seafood, buy precooked frozen shrimp or canned fish, such as tuna, salmon, or sardines. Stock your pantry so you always have certain foods on hand, such as olive oil, canned tuna, canned tomatoes, rice, pasta, and beans. Cooking Cook foods with extra-virgin olive oil instead of using butter or other vegetable oils. Have meat as a side dish. Have vegetables or grains as your main dish. This means having meat in small portions or adding small amounts of meat to foods like pasta or stew. Use beans or vegetables instead of meat in common dishes like chili or lasagna. Try out different cooking methods. Try roasting,  broiling, steaming, and sauting vegetables. Add frozen vegetables to soups, stews, pasta, or rice. Add nuts or seeds for added healthy fats and plant protein at each meal. You can add these to yogurt, salads, or vegetable dishes. Marinate fish or vegetables using olive oil, lemon juice, garlic, and fresh herbs. Meal planning Plan to eat a vegetarian meal one day each week. Try to work up to two vegetarian meals, if possible. Eat seafood two or more times a week. Have healthy snacks on hand. These may include: Vegetable sticks with hummus. Greek yogurt. Fruit and nut trail mix. Eat balanced meals. These should include: Fruit: 2-3 servings a day. Vegetables: 4-5 servings a day. Low-fat dairy: 2 servings a day. Fish, poultry, or lean meat: 1 serving a day. Beans and legumes: 2 or more servings a week. Nuts and seeds: 1-2 servings a day. Whole grains: 6-8 servings a day. Extra-virgin olive oil: 3-4 servings a day. Limit red meat and sweets to just a few servings a month. Lifestyle  Try to cook and eat meals with your family. Drink enough fluid to keep your pee (urine) pale yellow. Be active every day. This includes: Aerobic exercise, which is exercise that causes your heart to beat faster. Examples include running and swimming. Leisure activities like gardening, walking, or housework. Get 7-8 hours of sleep each night. Drink red wine if your provider says you can. A glass of wine is 5 oz (150 mL). You may be allowed to have: Up to 1 glass a day if you're female and not pregnant. Up to 2 glasses a day if  you're female. What foods should I eat? Fruits Apples. Apricots. Avocado. Berries. Bananas. Cherries. Dates. Figs. Grapes. Lemons. Melon. Oranges. Peaches. Plums. Pomegranate. Vegetables Artichokes. Beets. Broccoli. Cabbage. Carrots. Eggplant. Green beans. Chard. Kale. Spinach. Onions. Leeks. Peas. Squash. Tomatoes. Peppers. Radishes. Grains Whole-grain pasta. Brown rice. Bulgur  wheat. Polenta. Couscous. Whole-wheat bread. Dwyane Glad. Meats and other proteins Beans. Almonds. Sunflower seeds. Pine nuts. Peanuts. Cod. Salmon. Scallops. Shrimp. Tuna. Tilapia. Clams. Oysters. Eggs. Chicken or [place] without skin. Dairy Low-fat milk. Cheese. Greek yogurt. Fats and oils Extra-virgin olive oil. Avocado oil. Grapeseed oil. Beverages Water. Red wine. Herbal tea. Sweets and desserts Greek yogurt with honey. Baked apples. Poached pears. Trail mix. Seasonings and condiments Basil. Cilantro. Coriander. Cumin. Mint. Parsley. Sage. Rosemary. Tarragon. Garlic. Oregano. Thyme. Pepper. Balsamic vinegar. Tahini. Hummus. Tomato sauce. Olives. Mushrooms. The items listed above may not be all the foods and drinks you can have. Talk to a dietitian to learn more. What foods should I limit? This is a list of foods that should be eaten rarely. Fruits Fruit canned in syrup. Vegetables Deep-fried potatoes, like [place] fries. Grains Packaged pasta or rice dishes. Cereal with added sugar. Snacks with added sugar. Meats and other proteins Beef. Pork. Lamb. Chicken or [place] with skin. Hot dogs. Helene Loader. Dairy Ice cream. Sour cream. Whole milk. Fats and oils Butter. Canola oil. Vegetable oil. Beef fat (tallow). Lard. Beverages Juice. Sugar-sweetened soft drinks. Beer. Liquor and spirits. Sweets and desserts Cookies. Cakes. Pies. Candy. Seasonings and condiments Mayonnaise. Pre-made sauces and marinades. The items listed above may not be all the foods and drinks you should limit. Talk to a dietitian to learn more. Where to find more information American Heart Association (AHA): heart.org This information is not intended to replace advice given to you by your health care provider. Make sure you discuss any questions you have with your health care provider. Document Revised: 06/21/2022 Document Reviewed: 06/21/2022 Elsevier Patient Education  2024 [employer].

## 2023-08-06 NOTE — Progress Notes (Signed)
 Established Patient Visit  Patient: Veronica [name]   DOB: 08-09-1970   53 y.o. Female  MRN: [id number] Visit Date: 08/06/2023  Subjective:     Chief Complaint  Patient presents with   Follow-up    No concerns not fasting   HPI Morbid obesity (HCC) No weight loss noted Admits she is only taking med once a day, and has not made any lifestyle modifications. Wt Readings from Last 3 Encounters:  08/06/23 288 lb 6.4 oz (130.8 kg)  07/05/23 286 lb 3.2 oz (129.8 kg)  05/31/23 282 lb 9.6 oz (128.2 kg)    Advised about the importance of med dose compliance, in combination to lifestyle modification. Advised contrave  will be discontinued if no weight loss noted in the next 53month. She verbalized understanding Maintain med dose F/up in 53month  Reviewed medical, surgical, and social history today  Medications: Outpatient Medications Prior to Visit  Medication Sig   albuterol  (VENTOLIN  HFA) 108 (90 Base) MCG/ACT inhaler INHALE 1-2 PUFFS BY MOUTH EVERY 6 HOURS AS NEEDED FOR WHEEZE OR SHORTNESS OF BREATH   Ascorbic Acid (VITAMIN C) 1000 MG tablet Take 1,000 mg by mouth daily.   atorvastatin  (LIPITOR) 20 MG tablet Take 1 tablet (20 mg total) by mouth daily.   CELEBREX 200 MG capsule Take 200 mg by mouth daily as needed.   CONTRAVE  8-90 MG TB12 Take 1 tablet by mouth 2 (two) times daily.   cyanocobalamin (VITAMIN B12) 1000 MCG tablet Take 1,000 mcg by mouth daily.   DULoxetine  (CYMBALTA ) 20 MG capsule Take 1 capsule (20 mg total) by mouth daily.   fluconazole  (DIFLUCAN ) 150 MG tablet Take 1 tablet by mouth once. Repeat dose in 3 days if symptoms persist.   fluticasone  (FLONASE ) 50 MCG/ACT nasal spray Place 2 sprays into both nostrils daily. (Patient taking differently: Place 2 sprays into both nostrils daily as needed for allergies.)   losartan  (COZAAR ) 100 MG tablet TAKE 1 TABLET BY MOUTH EVERY DAY   montelukast  (SINGULAIR ) 10 MG tablet TAKE 1 TABLET BY MOUTH EVERYDAY AT  BEDTIME   Semaglutide , 2 MG/DOSE, 8 MG/3ML SOPN Inject 2 mg as directed once a week.   tiZANidine (ZANAFLEX) 2 MG tablet Take 2 mg by mouth 3 (three) times daily.   [DISCONTINUED] Multiple Vitamin (MULTIVITAMIN) tablet Take 1 tablet by mouth daily. (Patient not taking: Reported on 08/06/2023)   [DISCONTINUED] polyethylene glycol (MIRALAX ) 17 g packet Take 17 g by mouth daily. (Patient not taking: Reported on 08/06/2023)   No facility-administered medications prior to visit.   Reviewed past medical and social history.   ROS per HPI above      Objective:  BP 124/84 (BP Location: Left Arm, Patient Position: Sitting, Cuff Size: Normal)   Pulse 79   Temp (!) 97.1 F (36.2 C) (Temporal)   Ht 5\' 4"  (1.626 m)   Wt 288 lb 6.4 oz (130.8 kg)   LMP 06/15/2010   SpO2 98%   BMI 49.50 kg/m      Physical Exam Vitals and nursing note reviewed.  Constitutional:      Appearance: She is obese.  Cardiovascular:     Rate and Rhythm: Normal rate.     Pulses: Normal pulses.  Pulmonary:     Effort: Pulmonary effort is normal.  Neurological:     Mental Status: She is alert and oriented to person, place, and time.  Psychiatric:  Mood and Affect: Mood normal.        Behavior: Behavior normal.        Thought Content: Thought content normal.     No results found for any visits on 08/06/23.    Assessment & Plan:    Problem List Items Addressed This Visit     Morbid obesity (HCC) - Primary   No weight loss noted Admits she is only taking med once a day, and has not made any lifestyle modifications. Wt Readings from Last 3 Encounters:  08/06/23 288 lb 6.4 oz (130.8 kg)  07/05/23 286 lb 3.2 oz (129.8 kg)  05/31/23 282 lb 9.6 oz (128.2 kg)    Advised about the importance of med dose compliance, in combination to lifestyle modification. Advised contrave  will be discontinued if no weight loss noted in the next 72month. She verbalized understanding Maintain med dose F/up in 72month       Return in about 7 weeks (around 09/24/2023) for Weight management.     Kathrene Parents, NP

## 2023-08-06 NOTE — Telephone Encounter (Signed)
 Pharmacy Patient Advocate Encounter   Received notification from Patient Pharmacy that prior authorization for Ozempic  8 is required/requested.   Insurance verification completed.   The patient is insured through [facility] ADVANTAGE/RX ADVANCE .   Per test claim: The current 84 day co-pay is, $74.99.  No PA needed at this time. This test claim was processed through [facility]- copay amounts may vary at other pharmacies due to pharmacy/plan contracts, or as the patient moves through the different stages of their insurance plan.

## 2023-08-23 DIAGNOSIS — M170 Bilateral primary osteoarthritis of knee: Secondary | ICD-10-CM | POA: Diagnosis not present

## 2023-08-23 DIAGNOSIS — M1711 Unilateral primary osteoarthritis, right knee: Secondary | ICD-10-CM | POA: Diagnosis not present

## 2023-08-23 DIAGNOSIS — M1712 Unilateral primary osteoarthritis, left knee: Secondary | ICD-10-CM | POA: Diagnosis not present

## 2023-08-30 ENCOUNTER — Telehealth: Payer: Self-pay

## 2023-08-30 ENCOUNTER — Other Ambulatory Visit (HOSPITAL_COMMUNITY): Payer: Self-pay

## 2023-08-30 NOTE — Telephone Encounter (Signed)
 Pharmacy Patient Advocate Encounter   Received notification from Patient Pharmacy that prior authorization for Ozempic  8 is required/requested.   Insurance verification completed.   The patient is insured through [facility] ADVANTAGE/RX ADVANCE .   Per test claim: The current 84 day co-pay is, $74.99.  No PA needed at this time. This test claim was processed through [facility]- copay amounts may vary at other pharmacies due to pharmacy/plan contracts, or as the patient moves through the different stages of their insurance plan.     Duplicate request. Please see encounter 08/02/23.

## 2023-09-02 ENCOUNTER — Telehealth: Payer: Self-pay

## 2023-09-02 NOTE — Telephone Encounter (Signed)
 Pharmacy Patient Advocate Encounter   Received notification from CoverMyMeds that prior authorization for Ozempic  (2 MG/DOSE) 8MG /3ML pen-injectors is required/requested.   Insurance verification completed.   The patient is insured through [facility] .   Per test claim: PA required; PA submitted to above mentioned insurance via CoverMyMeds Key/confirmation #/EOC [facility] Status is pending

## 2023-09-03 ENCOUNTER — Other Ambulatory Visit (HOSPITAL_COMMUNITY): Payer: Self-pay

## 2023-09-03 NOTE — Telephone Encounter (Signed)
 Pharmacy Patient Advocate Encounter  Received notification from [facility] that Prior Authorization for Ozempic  (2 MG/DOSE) 8MG /3ML pen-injectors has been APPROVED from 09/02/23 to 09/01/24. Ran test claim, Copay is $74.99. This test claim was processed through [facility]- copay amounts may vary at other pharmacies due to pharmacy/plan contracts, or as the patient moves through the different stages of their insurance plan.   PA #/Case ID/Reference #: [id number]

## 2023-09-07 ENCOUNTER — Other Ambulatory Visit (HOSPITAL_COMMUNITY): Payer: Self-pay

## 2023-09-07 ENCOUNTER — Telehealth: Payer: Self-pay

## 2023-09-07 NOTE — Telephone Encounter (Signed)
 This is the 4th request. Patient 84 day co-pay is $74.99

## 2023-09-27 DIAGNOSIS — M1711 Unilateral primary osteoarthritis, right knee: Secondary | ICD-10-CM | POA: Diagnosis not present

## 2023-09-27 DIAGNOSIS — M1712 Unilateral primary osteoarthritis, left knee: Secondary | ICD-10-CM | POA: Diagnosis not present

## 2023-09-27 DIAGNOSIS — M170 Bilateral primary osteoarthritis of knee: Secondary | ICD-10-CM | POA: Diagnosis not present

## 2023-09-28 ENCOUNTER — Other Ambulatory Visit: Payer: Self-pay | Admitting: Nurse Practitioner

## 2023-09-28 DIAGNOSIS — F411 Generalized anxiety disorder: Secondary | ICD-10-CM

## 2023-10-01 ENCOUNTER — Encounter: Payer: Self-pay | Admitting: Nurse Practitioner

## 2023-10-01 ENCOUNTER — Ambulatory Visit (INDEPENDENT_AMBULATORY_CARE_PROVIDER_SITE_OTHER): Admitting: Nurse Practitioner

## 2023-10-01 VITALS — BP 132/70 | HR 88 | Temp 98.2°F | Ht 64.0 in | Wt 281.2 lb

## 2023-10-01 DIAGNOSIS — I10 Essential (primary) hypertension: Secondary | ICD-10-CM

## 2023-10-01 DIAGNOSIS — Z6841 Body Mass Index (BMI) 40.0 and over, adult: Secondary | ICD-10-CM | POA: Diagnosis not present

## 2023-10-01 DIAGNOSIS — E1169 Type 2 diabetes mellitus with other specified complication: Secondary | ICD-10-CM

## 2023-10-01 DIAGNOSIS — E785 Hyperlipidemia, unspecified: Secondary | ICD-10-CM

## 2023-10-01 DIAGNOSIS — F411 Generalized anxiety disorder: Secondary | ICD-10-CM

## 2023-10-01 DIAGNOSIS — Z7985 Long-term (current) use of injectable non-insulin antidiabetic drugs: Secondary | ICD-10-CM

## 2023-10-01 LAB — COMPREHENSIVE METABOLIC PANEL WITH GFR
ALT: 17 U/L (ref 0–35)
AST: 13 U/L (ref 0–37)
Albumin: 4.2 g/dL (ref 3.5–5.2)
Alkaline Phosphatase: 94 U/L (ref 39–117)
BUN: 13 mg/dL (ref 6–23)
CO2: 37 meq/L — ABNORMAL HIGH (ref 19–32)
Calcium: 9.4 mg/dL (ref 8.4–10.5)
Chloride: 99 meq/L (ref 96–112)
Creatinine, Ser: 0.94 mg/dL (ref 0.40–1.20)
GFR: 68.33 mL/min (ref >60.00)
Glucose, Bld: 86 mg/dL (ref 70–99)
Potassium: 3.6 meq/L (ref 3.5–5.1)
Sodium: 140 meq/L (ref 135–145)
Total Bilirubin: 0.4 mg/dL (ref 0.2–1.2)
Total Protein: 7.7 g/dL (ref 6.0–8.3)

## 2023-10-01 LAB — MICROALBUMIN / CREATININE URINE RATIO
Creatinine,U: 52.3 mg/dL
Microalb Creat Ratio: 23.1 mg/g (ref 0.0–30.0)
Microalb, Ur: 1.2 mg/dL (ref 0.0–1.9)

## 2023-10-01 MED ORDER — DULOXETINE HCL 20 MG PO CPEP: 20.0000 mg | ORAL_CAPSULE | Freq: Every day | ORAL | 3 refills | Status: AC

## 2023-10-01 MED ORDER — ATORVASTATIN CALCIUM 20 MG PO TABS
20.0000 mg | ORAL_TABLET | Freq: Every day | ORAL | 3 refills | Status: AC
Start: 2023-10-01 — End: ?

## 2023-10-01 MED ORDER — CONTRAVE 8-90 MG PO TB12: 1.0000 | ORAL_TABLET | Freq: Two times a day (BID) | ORAL | 2 refills | Status: DC

## 2023-10-01 NOTE — Assessment & Plan Note (Signed)
 Lost 7lbs in last 2months with contrave  BID Diet: continues to struggle with maintaining a balance healthy diet Exercise: no regimen Wt Readings from Last 3 Encounters:  10/01/23 281 lb 3.2 oz (127.6 kg)  08/06/23 288 lb 6.4 oz (130.8 kg)  07/05/23 286 lb 3.2 oz (129.8 kg)    Advised about the importance of maintaining at least daily-high protein and fiber, need for daily exercise. Maintain contrave  dose for another 3months, then stop F/up in 3months

## 2023-10-01 NOTE — Patient Instructions (Signed)
 Go to lab Maintain Heart healthy diet and daily exercise. Maintain current medications.  How to Increase Your Level of Physical Activity Getting regular physical activity is important for your overall health and well-being. Most people do not get enough exercise. There are easy ways to increase your level of physical activity, even if you have not been very active in the past or if you are just starting out. What are the benefits of physical activity? Physical activity has many short-term and long-term benefits. Being active on a regular basis can improve your physical and mental health as well as provide other benefits. Physical health benefits Helping you lose weight or maintain a healthy weight. Strengthening your muscles and bones. Reducing your risk of certain long-term (chronic) diseases, including heart disease, cancer, and diabetes. Being able to move around more easily and for longer periods of time without getting tired (increased endurance or stamina). Improving your ability to fight off illness (enhanced immunity). Being able to sleep better. Helping you stay healthy as you get older, including: Helping you stay mobile, or capable of walking and moving around. Preventing accidents, such as falls. Increasing life expectancy. Mental health benefits Boosting your mood and improving your self-esteem. Lowering your chance of having mental health problems, such as depression or anxiety. Helping you feel good about your body. Other benefits Finding new sources of fun and enjoyment. Meeting new people who share a common interest. Before you begin If you have a chronic illness or have not been active for a while, check with your health care provider about how to get started. Ask your health care provider what activities are safe for you. Start out slowly. Walking or doing some simple chair exercises is a good place to start, especially if you have not been active before or for a long  time. Set goals that you can work toward. Ask your health care provider how much exercise is best for you. In general, most adults should: Do moderate-intensity exercise for at least 150 minutes each week (30 minutes on most days of the week) or vigorous exercise for at least 75 minutes each week, or a combination of these. Moderate-intensity exercise can include walking at a quick pace, biking, yoga, water aerobics, or gardening. Vigorous exercise involves activities that take more effort, such as jogging or running, playing sports, swimming laps, or jumping rope. Do strength exercises on at least 2 days each week. This can include weight lifting, body weight exercises, and resistance-band exercises. How to be more physically active Make a plan  Try to find activities that you enjoy. You are more likely to commit to an exercise routine if it does not feel like a chore. If you have bone or joint problems, choose low-impact exercises, like walking or swimming. Use these tips for being successful with an exercise plan: Find a workout partner for accountability. Join a group or class, such as an aerobics class, cycling class, or sports team. Make family time active. Go for a walk, bike, or swim. Include a variety of exercises each week. Consider using a fitness tracker, such as a mobile phone app or a device worn like a watch, that will count the number of steps you take each day. Many people strive to reach 10,000 steps a day. Find ways to be active in your daily routines Besides your formal exercise plans, you can find ways to do physical activity during your daily routines, such as: Walking or biking to work or to the store. Taking  the stairs instead of the elevator. Parking farther away from the door at work or at the store. Planning walking meetings. Walking around while you are on the phone. Where to find more information Centers for Disease Control and Prevention:  [employer]'s Council on Fitness, Sports & Nutrition: www.fitness.gov ChooseMyPlate: http://www.harvey.com/ Contact a health care provider if: You have headaches, muscle aches, or joint pain that is concerning. You feel dizzy or light-headed while exercising. You faint. You feel your heart skipping, racing, or fluttering. You have chest pain while exercising. Summary Exercise benefits your mind and body at any age, even if you are just starting out. If you have a chronic illness or have not been active for a while, check with your health care provider before increasing your physical activity. Choose activities that are safe and enjoyable for you. Ask your health care provider what activities are safe for you. Start slowly. Tell your health care provider if you have problems as you start to increase your activity level. This information is not intended to replace advice given to you by your health care provider. Make sure you discuss any questions you have with your health care provider. Document Revised: 07/05/2020 Document Reviewed: 07/05/2020 Elsevier Patient Education  2024 [employer].

## 2023-10-01 NOTE — Progress Notes (Signed)
 Established Patient Visit  Patient: Veronica [name]   DOB: [birth year]   53 y.o. Female  MRN: [id number] Visit Date: 10/01/2023  Subjective:    Chief Complaint  Patient presents with   Follow-up    7 week follow up for weight management    HPI Morbid obesity (HCC) Lost 7lbs in last 2months with contrave  BID Diet: continues to struggle with maintaining a balance healthy diet Exercise: no regimen Wt Readings from Last 3 Encounters:  10/01/23 281 lb 3.2 oz (127.6 kg)  08/06/23 288 lb 6.4 oz (130.8 kg)  07/05/23 286 lb 3.2 oz (129.8 kg)    Advised about the importance of maintaining at least daily-high protein and fiber, need for daily exercise. Maintain contrave  dose for another 63months, then stop F/up in 63months  GAD (generalized anxiety disorder) Stable mood with cymbalta  20mg   Hyperlipidemia associated with type 2 diabetes mellitus (HCC) LDL at goal Maintain atorvastatin  dose  DM (diabetes mellitus) (HCC) Controlled with ozempic  Last hgbA1c at 5.8% LDL at goal with statin No neuropathy, no retinopathy Requested DIABETES eye exam report BP at goal  Repeat CMP and UACr Maintain med dose F/up in 63month  HTN (hypertension), benign BP at goal BP Readings from Last 3 Encounters:  10/01/23 132/70  08/06/23 124/84  07/05/23 118/78    Maintain losartan  dose  Wt Readings from Last 3 Encounters:  10/01/23 281 lb 3.2 oz (127.6 kg)  08/06/23 288 lb 6.4 oz (130.8 kg)  07/05/23 286 lb 3.2 oz (129.8 kg)    Reviewed medical, surgical, and social history today  Medications: Outpatient Medications Prior to Visit  Medication Sig   albuterol  (VENTOLIN  HFA) 108 (90 Base) MCG/ACT inhaler INHALE 1-2 PUFFS BY MOUTH EVERY 6 HOURS AS NEEDED FOR WHEEZE OR SHORTNESS OF BREATH   Ascorbic Acid (VITAMIN C) 1000 MG tablet Take 1,000 mg by mouth daily.   CELEBREX 200 MG capsule Take 200 mg by mouth daily as needed.   cyanocobalamin (VITAMIN B12) 1000 MCG tablet  Take 1,000 mcg by mouth daily.   fluticasone  (FLONASE ) 50 MCG/ACT nasal spray Place 2 sprays into both nostrils daily. (Patient taking differently: Place 2 sprays into both nostrils daily as needed for allergies.)   losartan  (COZAAR ) 100 MG tablet TAKE 1 TABLET BY MOUTH EVERY DAY   montelukast  (SINGULAIR ) 10 MG tablet TAKE 1 TABLET BY MOUTH EVERYDAY AT BEDTIME   Semaglutide , 2 MG/DOSE, 8 MG/3ML SOPN Inject 2 mg as directed once a week.   tiZANidine (ZANAFLEX) 2 MG tablet Take 2 mg by mouth 3 (three) times daily.   [DISCONTINUED] atorvastatin  (LIPITOR) 20 MG tablet Take 1 tablet (20 mg total) by mouth daily.   [DISCONTINUED] CONTRAVE  8-90 MG TB12 Take 1 tablet by mouth 2 (two) times daily.   [DISCONTINUED] DULoxetine  (CYMBALTA ) 20 MG capsule TAKE 1 CAPSULE BY MOUTH EVERY DAY   [DISCONTINUED] fluconazole  (DIFLUCAN ) 150 MG tablet Take 1 tablet by mouth once. Repeat dose in 3 days if symptoms persist.   No facility-administered medications prior to visit.   Reviewed past medical and social history.   ROS per HPI above      Objective:  BP 132/70 (BP Location: Left Arm, Patient Position: Sitting, Cuff Size: Large)   Pulse 88   Temp 98.2 F (36.8 C) (Oral)   Ht 5' 4 (1.626 m)   Wt 281 lb 3.2 oz (127.6 kg)   LMP 06/15/2010   SpO2  97%   BMI 48.27 kg/m      Physical Exam  No results found for any visits on 10/01/23.    Assessment & Plan:    Problem List Items Addressed This Visit     DM (diabetes mellitus) (HCC)   Controlled with ozempic  Last hgbA1c at 5.8% LDL at goal with statin No neuropathy, no retinopathy Requested DIABETES eye exam report BP at goal  Repeat CMP and UACr Maintain med dose F/up in 10month      Relevant Medications   atorvastatin  (LIPITOR) 20 MG tablet   Other Relevant Orders   Urine microalbumin-creatinine with uACR   Comprehensive metabolic panel with GFR   GAD (generalized anxiety disorder)   Stable mood with cymbalta  20mg       Relevant  Medications   DULoxetine  (CYMBALTA ) 20 MG capsule   HTN (hypertension), benign - Primary   BP at goal BP Readings from Last 3 Encounters:  10/01/23 132/70  08/06/23 124/84  07/05/23 118/78    Maintain losartan  dose      Relevant Medications   atorvastatin  (LIPITOR) 20 MG tablet   Other Relevant Orders   Comprehensive metabolic panel with GFR   Hyperlipidemia associated with type 2 diabetes mellitus (HCC)   LDL at goal Maintain atorvastatin  dose      Relevant Medications   atorvastatin  (LIPITOR) 20 MG tablet   Morbid obesity (HCC)   Lost 7lbs in last 2months with contrave  BID Diet: continues to struggle with maintaining a balance healthy diet Exercise: no regimen Wt Readings from Last 3 Encounters:  10/01/23 281 lb 3.2 oz (127.6 kg)  08/06/23 288 lb 6.4 oz (130.8 kg)  07/05/23 286 lb 3.2 oz (129.8 kg)    Advised about the importance of maintaining at least daily-high protein and fiber, need for daily exercise. Maintain contrave  dose for another 10months, then stop F/up in 10months      Relevant Medications   CONTRAVE  8-90 MG TB12   Return in about 3 months (around 01/01/2024) for HTN, DM, Weight management, hyperlipidemia (fasting).     Roselie Mood, NP

## 2023-10-01 NOTE — Assessment & Plan Note (Signed)
 BP at goal BP Readings from Last 3 Encounters:  10/01/23 132/70  08/06/23 124/84  07/05/23 118/78    Maintain losartan  dose

## 2023-10-01 NOTE — Assessment & Plan Note (Signed)
LDL at goal Maintain atorvastatin dose 

## 2023-10-01 NOTE — Assessment & Plan Note (Signed)
Stable mood with cymbalta 20mg 

## 2023-10-01 NOTE — Assessment & Plan Note (Signed)
 Controlled with ozempic  Last hgbA1c at 5.8% LDL at goal with statin No neuropathy, no retinopathy Requested DIABETES eye exam report BP at goal  Repeat CMP and UACr Maintain med dose F/up in 64month

## 2023-10-04 ENCOUNTER — Ambulatory Visit: Payer: Self-pay | Admitting: Nurse Practitioner

## 2023-10-11 ENCOUNTER — Telehealth: Payer: Self-pay

## 2023-10-11 NOTE — Telephone Encounter (Signed)
 Called patient to ask about the type of form she is asking if separate appointment is needed. She stated that she is starting a new job and a health screen form is needed to be filled by her PCP. She also asked if she is due for a physical because she thought she had once back in January with [place]. I reviewed her past appointment and office notes. I informed patient that I do not see where she has had a physical that back at the 10/21/22 appointment that [facility] wanted her to follow up in 20 weeks (around 03/10/2023) for DM, HTN, hyperlipidemia, CPE (fasting). She asked if I could get with Roselie to confirm if she did have a physical back in December 2024/January 2025. I informed her that I will pass it along to Frannie and get back with her. She thanked me for calling and all (if any) questions were answered.

## 2023-10-11 NOTE — Telephone Encounter (Signed)
 Copied from CRM [id number]. Topic: Appointments - Scheduling Inquiry for Clinic >> Oct 11, 2023  2:54 PM Veronica [name] wrote: Reason for CRM: Patient would like to know if she needs to schedule a separate appointment for a physical to have a health exam certification  form completed.   CB#[phone]

## 2023-10-12 NOTE — Telephone Encounter (Signed)
 Called and asked patient if she could bring the form into the office that per Roselie she would need to view the form to determine if form can be completed prior to upcoming CPE appointment or using date of last CPE. Patient thanked me for calling and stated that she will drop the form off Monday or Tuesday of next week.

## 2023-10-15 IMAGING — MG MM DIGITAL DIAGNOSTIC UNILAT*L* W/ TOMO W/ CAD
4 series · 4 of 12 positions shown · non-contrast
Comparison: Previous exams.

CLINICAL DATA: Screening recall for possible left breast masses.

EXAM:
DIGITAL DIAGNOSTIC UNILATERAL LEFT MAMMOGRAM WITH TOMOSYNTHESIS AND
CAD; ULTRASOUND LEFT BREAST LIMITED
TECHNIQUE: Left digital diagnostic mammography and breast tomosynthesis was
performed. The images were evaluated with computer-aided detection.;
Targeted ultrasound examination of the left breast was performed.

[L CC synth-2D]
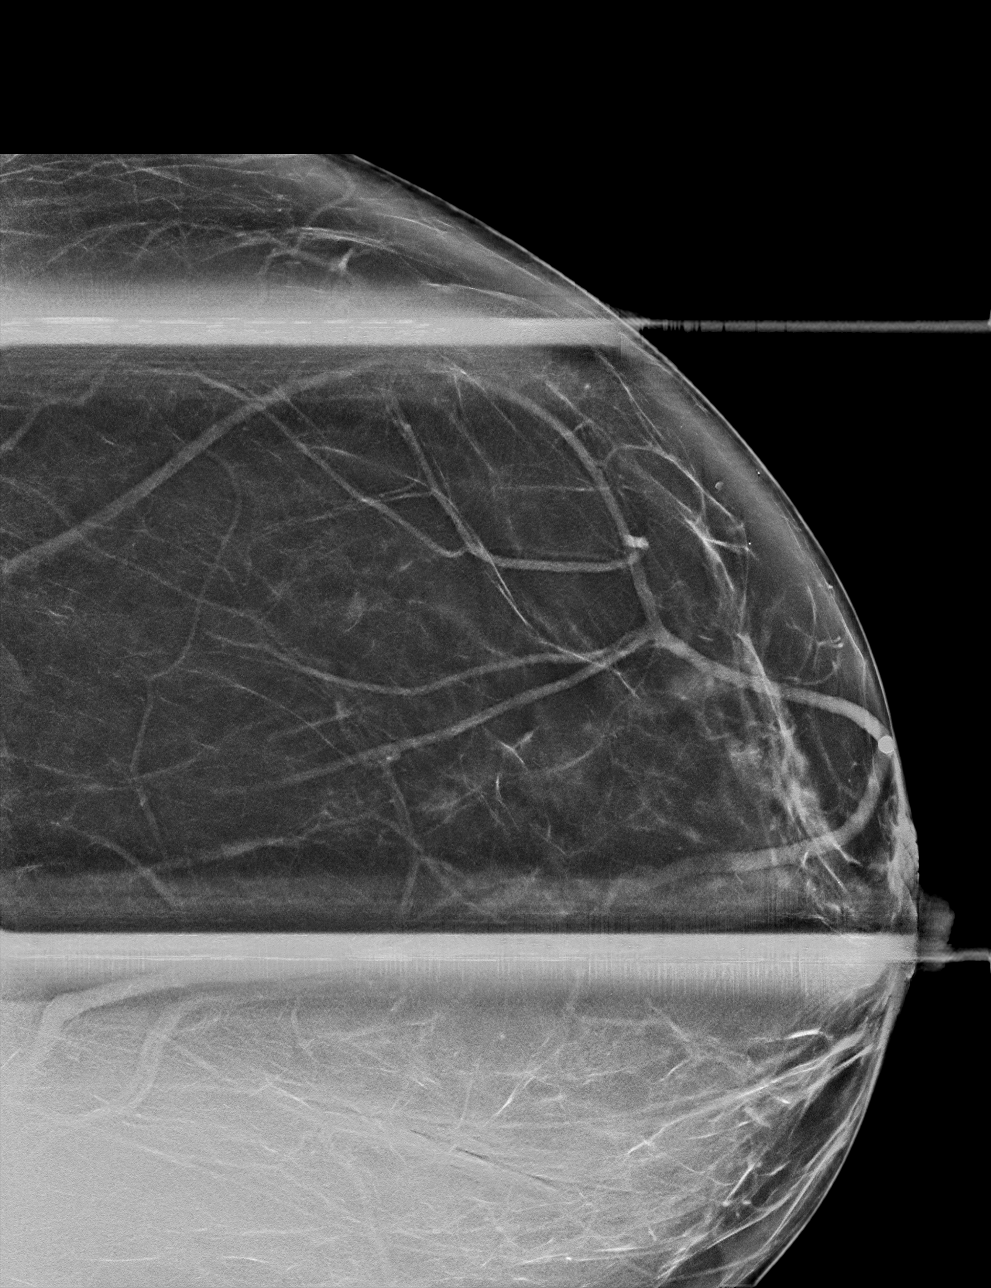

[L MLO synth-2D]
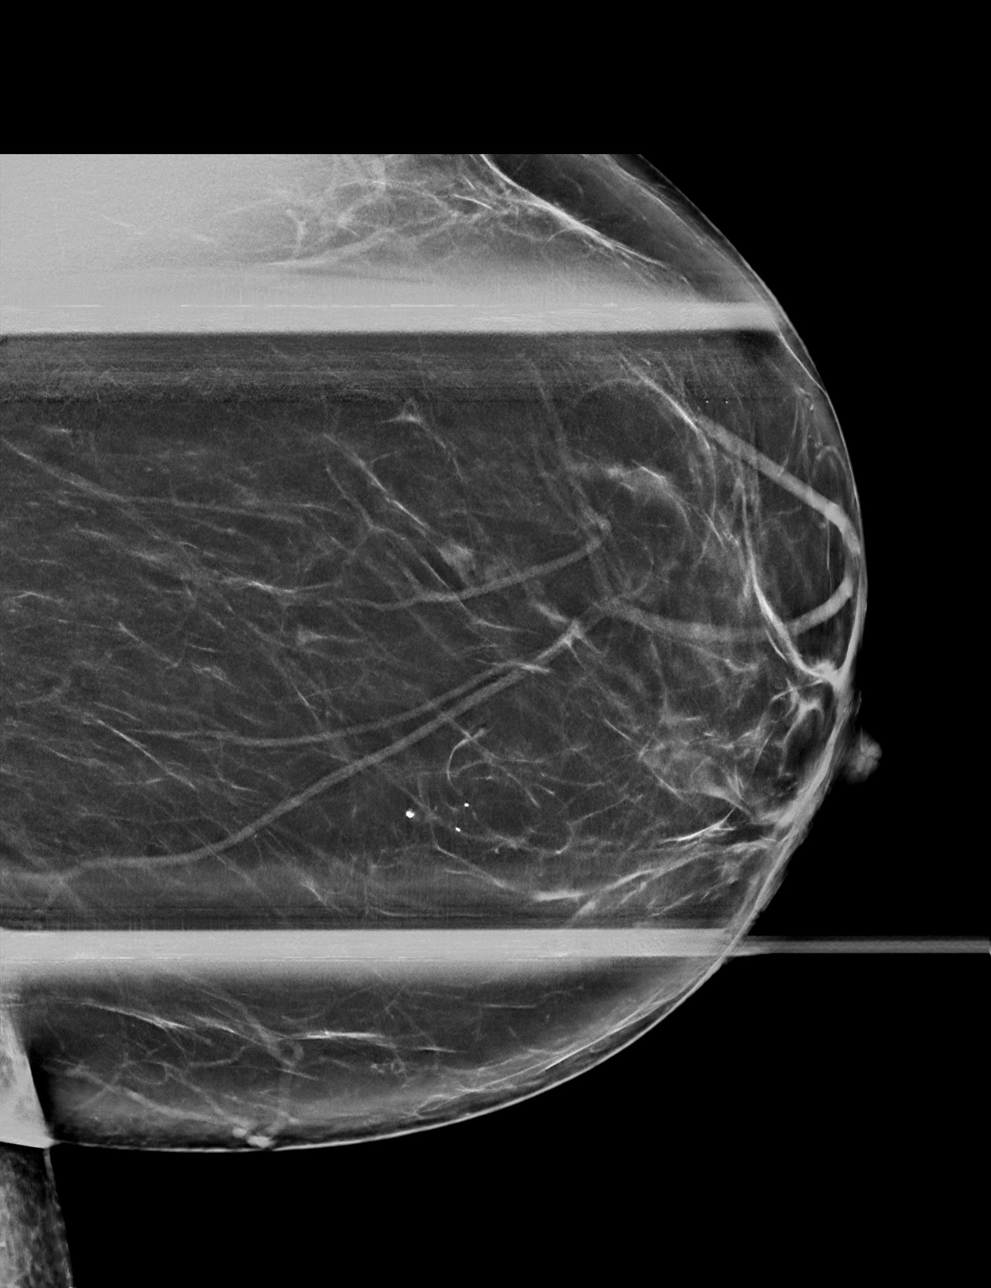

[L CC tomo · tomo slice 37/72.0]
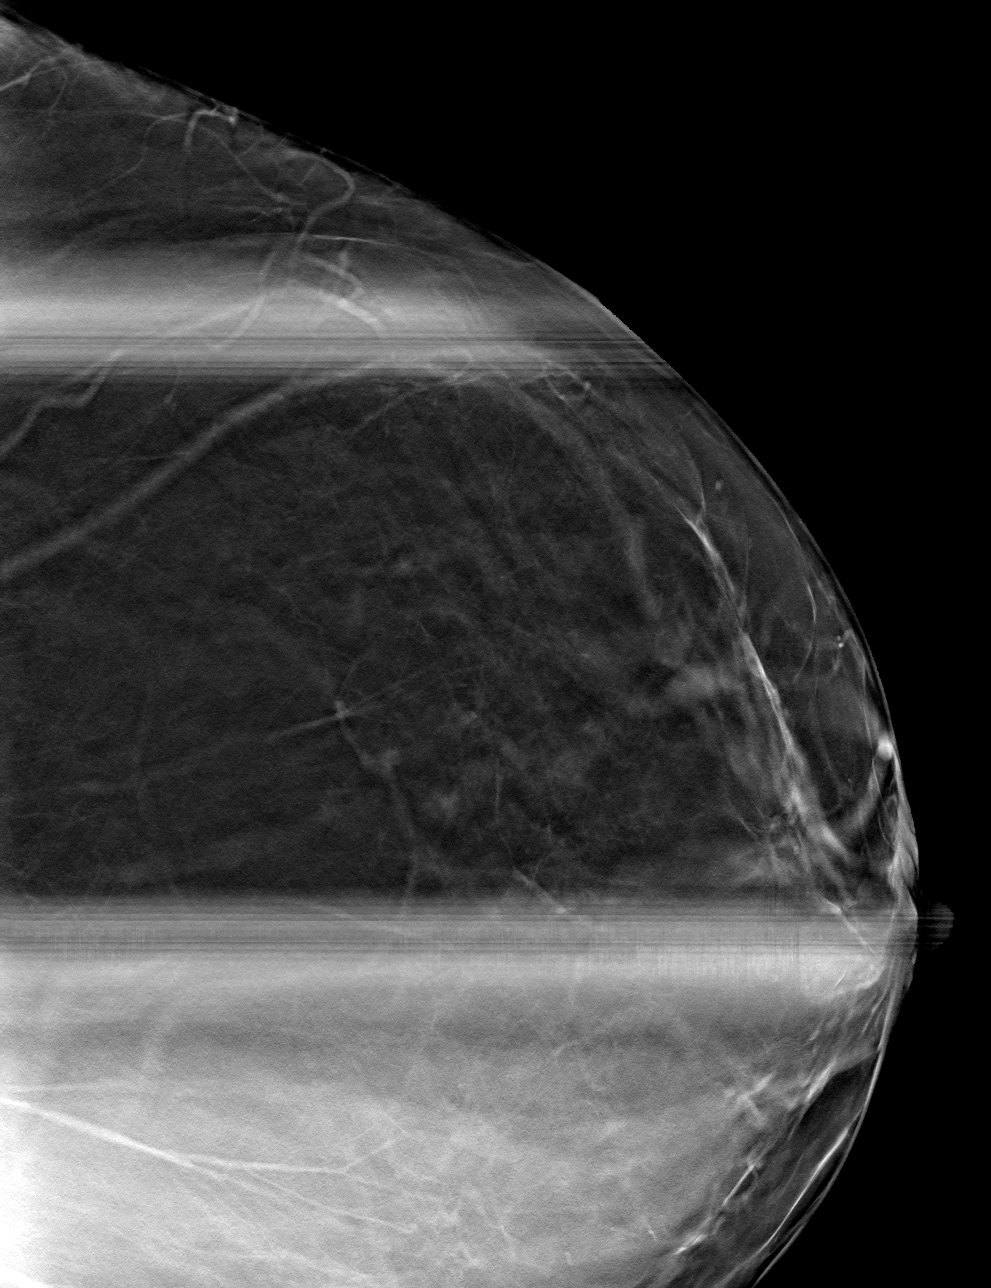

[L MLO tomo · tomo slice 41/81.0]
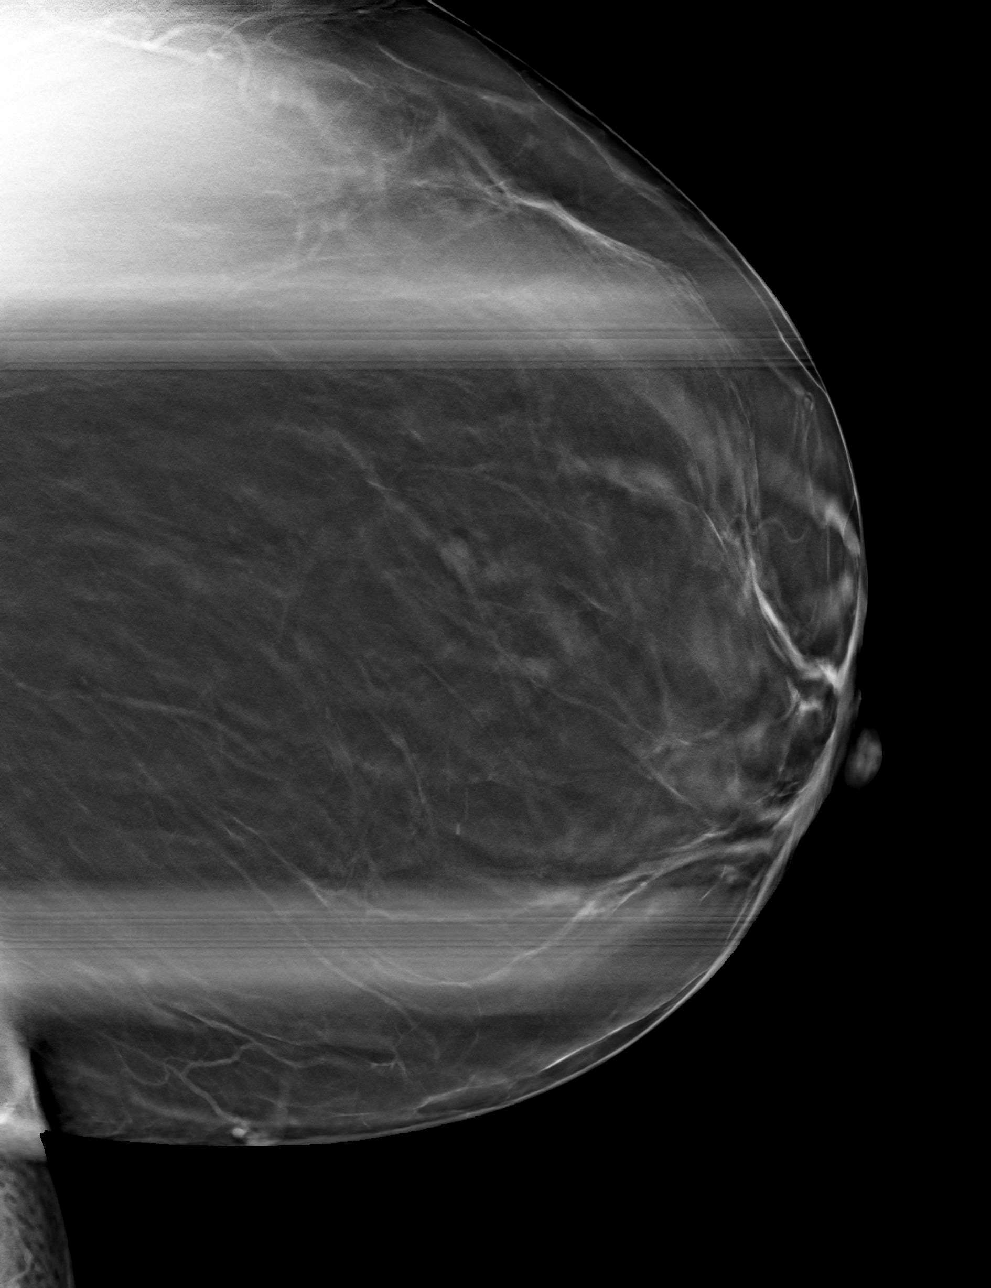

[4 of 12 positions shown; findings below may reference images not displayed]

ACR Breast Density Category b: There are scattered areas of
fibroglandular density.
FINDINGS: Additional tomograms were performed of the left breast. There is an
oval mass in the outer left breast measuring 1 cm. There is an
additional oval mass versus 2 adjacent oval masses in the central
left breast measuring 0.9 cm.

Targeted ultrasound of the outer central and upper left breast was
performed demonstrating scattered dilated ducts and small areas of
fibrocystic change. A portion of a dilated duct in the left breast
at the [DATE] position 3 cm from nipple measures 0.8 x 0.3 x 0.6 cm.
This likely corresponds with the small mass seen in the outer left
breast at mammography. Additional portion of an ectatic duct is seen
in the left breast at 3 o'clock 1 cm from the nipple measures 0.7 x
0.6 x 1 cm. This likely corresponds with the additional mass seen in
the lower left breast at mammography.
IMPRESSION: Probably benign ductal ectasia in the left breast.

RECOMMENDATION:
Diagnostic mammography of the left breast in 6 months with possible
ultrasound.

I have discussed the findings and recommendations with the patient.
If applicable, a reminder letter will be sent to the patient
regarding the next appointment.

BI-RADS CATEGORY  3: Probably benign.

## 2023-10-15 IMAGING — US US BREAST*L* LIMITED INC AXILLA
1 series · 13 of 16 positions shown · non-contrast
Comparison: Previous exams.

CLINICAL DATA: Screening recall for possible left breast masses.

EXAM:
DIGITAL DIAGNOSTIC UNILATERAL LEFT MAMMOGRAM WITH TOMOSYNTHESIS AND
CAD; ULTRASOUND LEFT BREAST LIMITED
TECHNIQUE: Left digital diagnostic mammography and breast tomosynthesis was
performed. The images were evaluated with computer-aided detection.;
Targeted ultrasound examination of the left breast was performed.

[Series 1: us breast*left* limited inc axilla · 0.08mm/px · 13 of 16 slices shown]
[im 1/16]
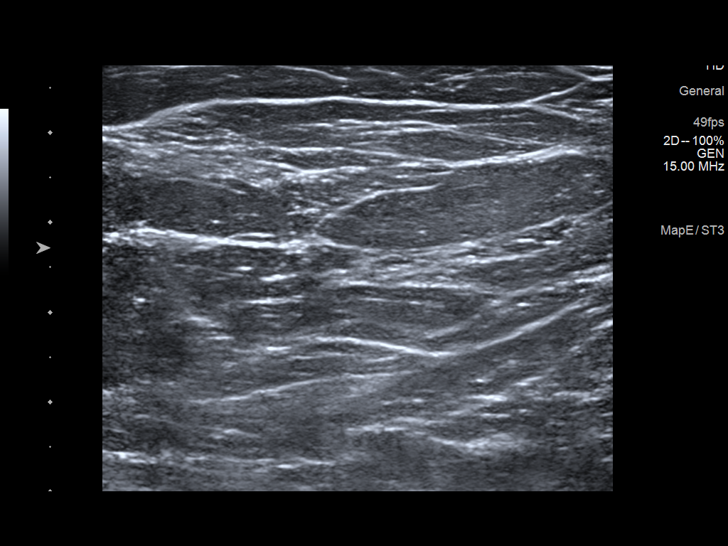
[im 2/16]
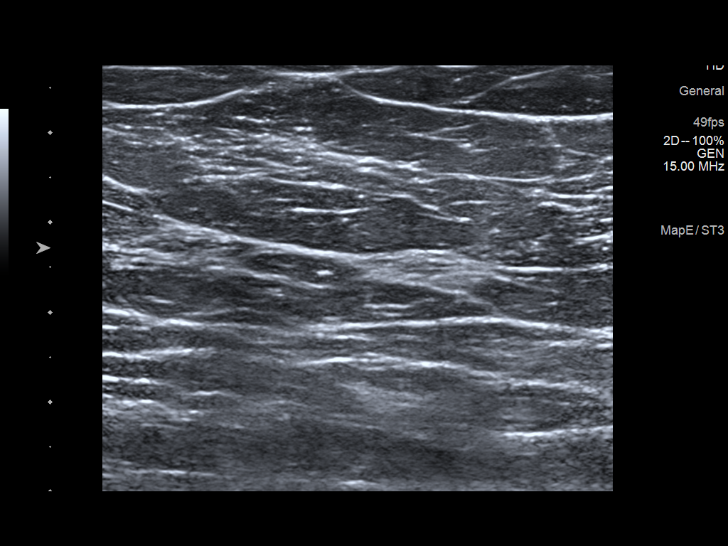
[im 4/16]
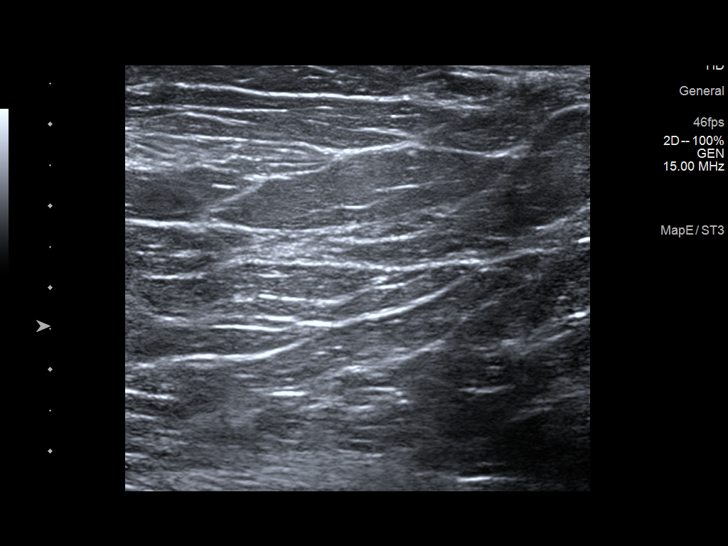
[im 5/16]
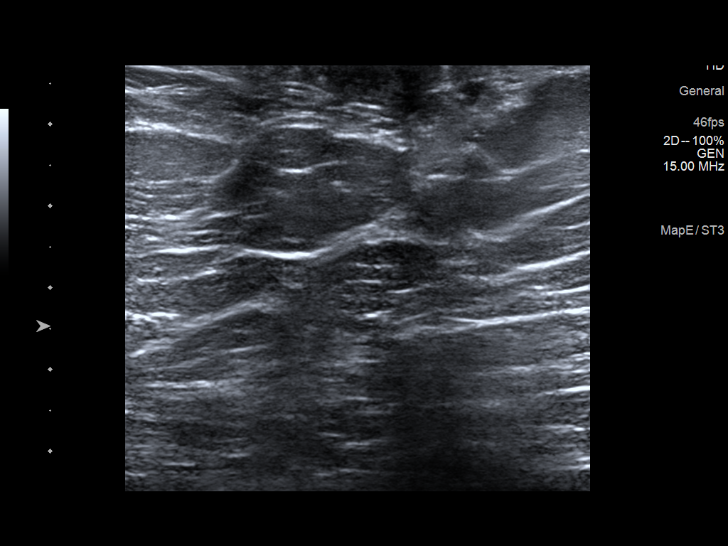
[im 6/16]
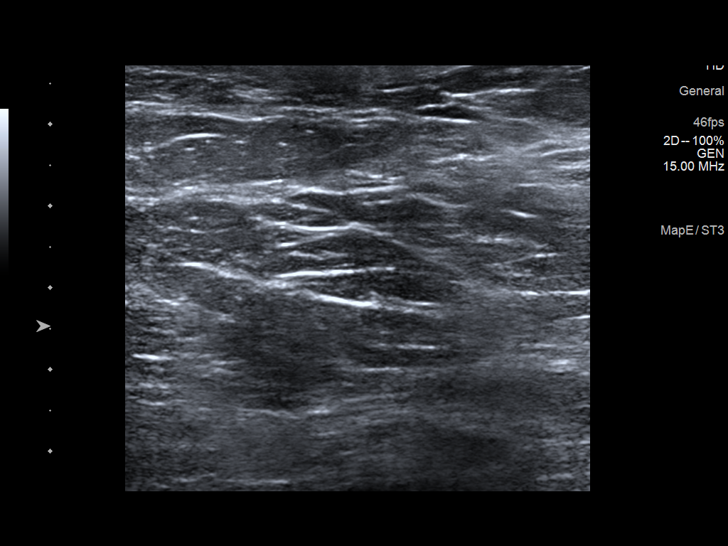
[im 7/16]
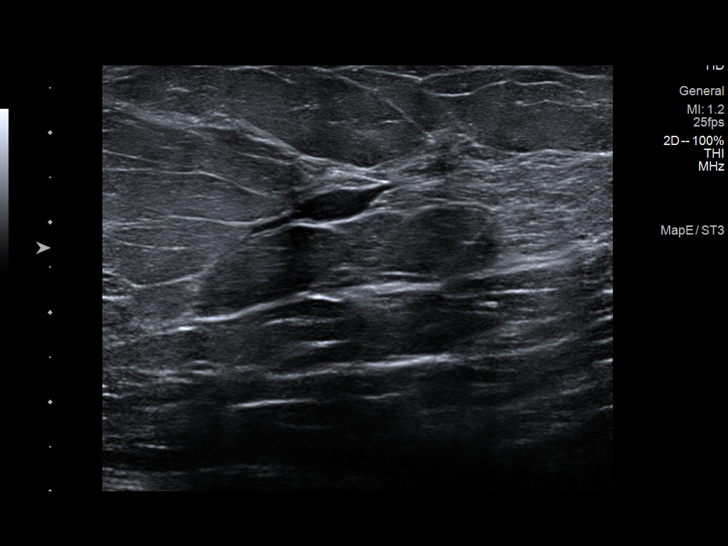
[im 9/16]
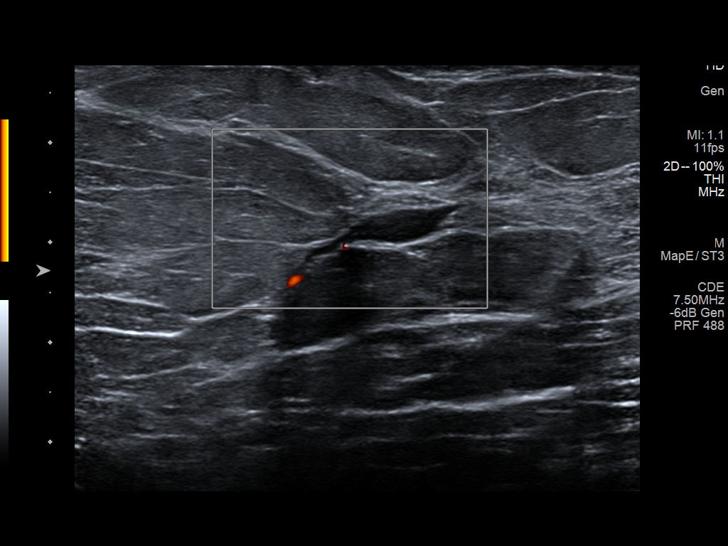
[im 10/16]
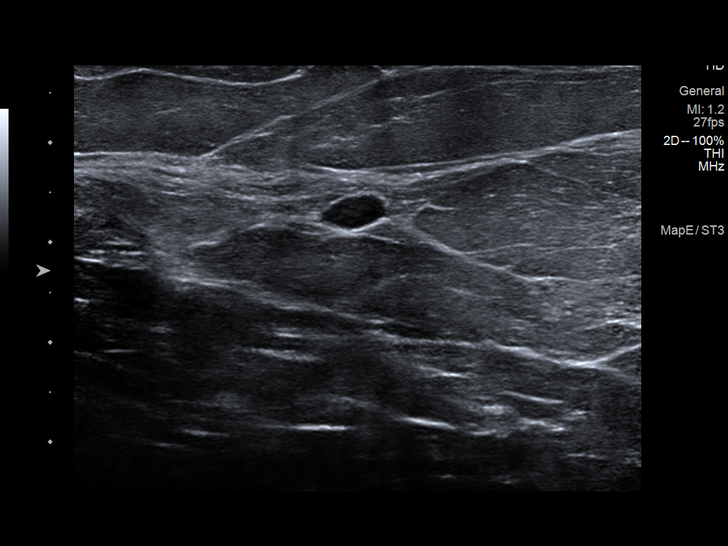
[im 11/16]
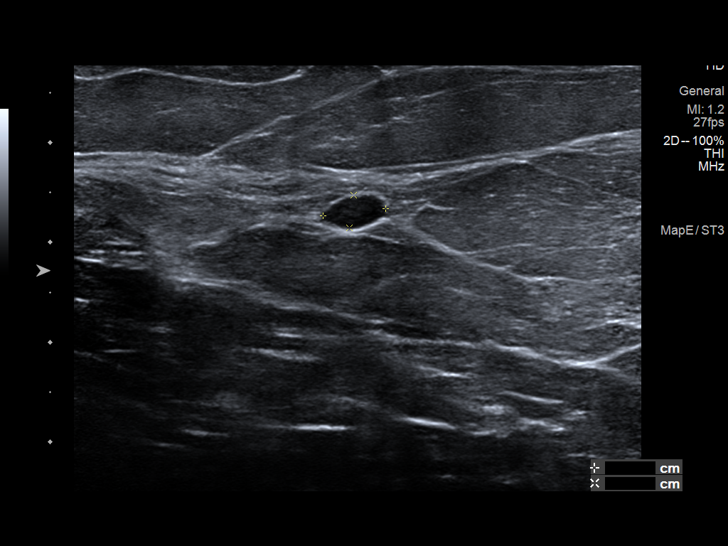
[im 12/16]
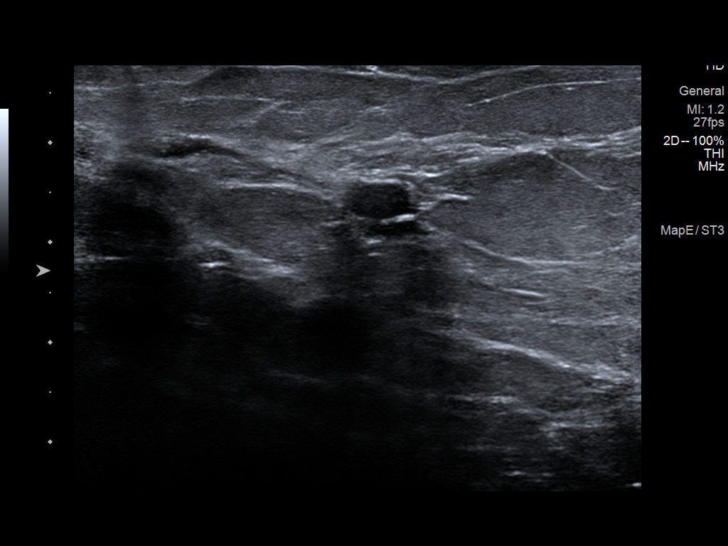
[im 13/16]
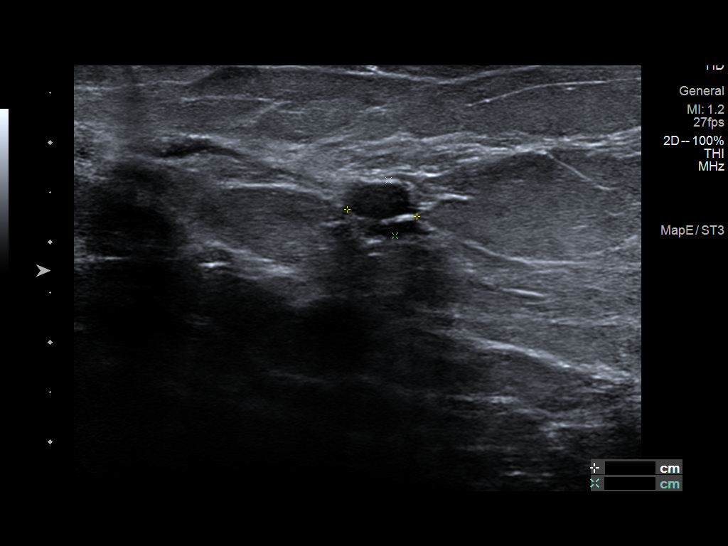
[im 15/16]
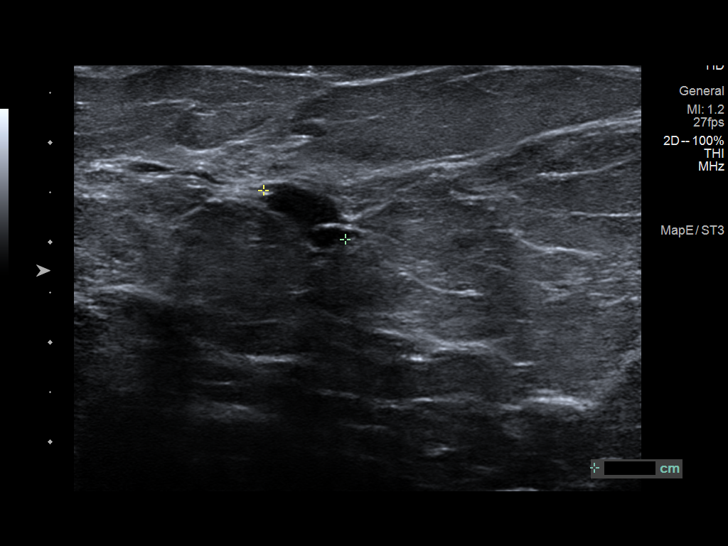
[im 16/16]
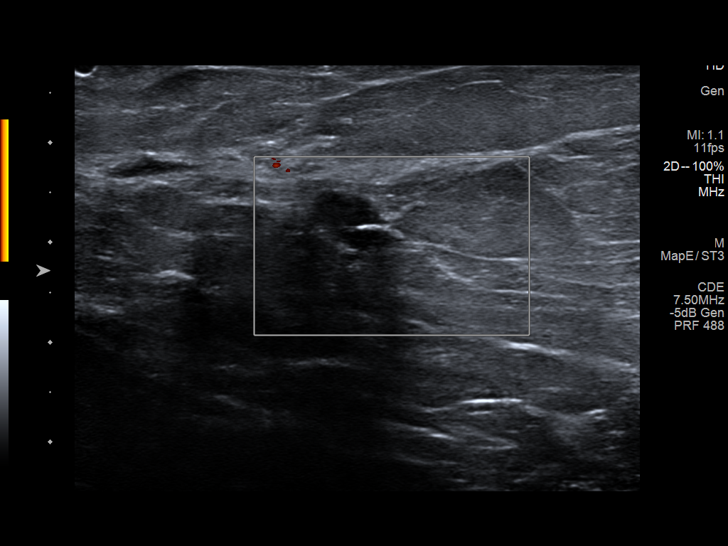

[13 of 16 positions shown; findings below may reference images not displayed]

ACR Breast Density Category b: There are scattered areas of
fibroglandular density.
FINDINGS: Additional tomograms were performed of the left breast. There is an
oval mass in the outer left breast measuring 1 cm. There is an
additional oval mass versus 2 adjacent oval masses in the central
left breast measuring 0.9 cm.

Targeted ultrasound of the outer central and upper left breast was
performed demonstrating scattered dilated ducts and small areas of
fibrocystic change. A portion of a dilated duct in the left breast
at the [DATE] position 3 cm from nipple measures 0.8 x 0.3 x 0.6 cm.
This likely corresponds with the small mass seen in the outer left
breast at mammography. Additional portion of an ectatic duct is seen
in the left breast at 3 o'clock 1 cm from the nipple measures 0.7 x
0.6 x 1 cm. This likely corresponds with the additional mass seen in
the lower left breast at mammography.
IMPRESSION: Probably benign ductal ectasia in the left breast.

RECOMMENDATION:
Diagnostic mammography of the left breast in 6 months with possible
ultrasound.

I have discussed the findings and recommendations with the patient.
If applicable, a reminder letter will be sent to the patient
regarding the next appointment.

BI-RADS CATEGORY  3: Probably benign.

## 2023-10-25 ENCOUNTER — Ambulatory Visit (INDEPENDENT_AMBULATORY_CARE_PROVIDER_SITE_OTHER)

## 2023-10-25 ENCOUNTER — Encounter: Payer: Self-pay | Admitting: Podiatry

## 2023-10-25 ENCOUNTER — Telehealth: Payer: Self-pay | Admitting: Nurse Practitioner

## 2023-10-25 ENCOUNTER — Ambulatory Visit (INDEPENDENT_AMBULATORY_CARE_PROVIDER_SITE_OTHER): Payer: Self-pay | Admitting: Podiatry

## 2023-10-25 DIAGNOSIS — M19071 Primary osteoarthritis, right ankle and foot: Secondary | ICD-10-CM

## 2023-10-25 DIAGNOSIS — M19072 Primary osteoarthritis, left ankle and foot: Secondary | ICD-10-CM

## 2023-10-25 DIAGNOSIS — M25572 Pain in left ankle and joints of left foot: Secondary | ICD-10-CM | POA: Diagnosis not present

## 2023-10-25 DIAGNOSIS — M25571 Pain in right ankle and joints of right foot: Secondary | ICD-10-CM | POA: Diagnosis not present

## 2023-10-25 NOTE — Telephone Encounter (Signed)
 Patient dropped off document health clearance form, to be filled out by provider. Patient requested to send it back via Call Patient to pick up within 7-days. Document is located in providers tray at front office.Please advise at Mobile [phone] (mobile)   Pt walked in to drop off a form. I put in the dr box

## 2023-10-25 NOTE — Progress Notes (Unsigned)
 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 Chronic condition. 5 pain. Non diabetic. Taking celerex and tyenol  and  OTC inserts. Pt. Wants custom orthotics.   HPI: 53 y.o. female presents today for concern of pain in both ankles.  She is concerned she probably has arthritis.  States the right ankle hurts worse than the left.  She is interested in custom orthotics.  Denies any recent injury.  Past Medical History:  Diagnosis Date   Anxiety    Arthritis    Asthma    DM (diabetes mellitus) (HCC)    GERD (gastroesophageal reflux disease)    HTN (hypertension)    Obesity    Sleep apnea    Vaginal bleeding 05/12/2021   Past Surgical History:  Procedure Laterality Date   CESAREAN SECTION     x 3   CHOLECYSTECTOMY     COLONOSCOPY WITH PROPOFOL  N/A 03/02/2022   Procedure: COLONOSCOPY WITH PROPOFOL ;  Surgeon: Eda Iha, MD;  Location: [facility];  Service: Gastroenterology;  Laterality: N/A;   IR ABLATE LIVER CRYOABLATION  08/21/2021   IR ABLATE LIVER CRYOABLATION  08/21/2021   IR ABLATE LIVER CRYOABLATION  08/21/2021   IR ABLATE LIVER CRYOABLATION  09/17/2021   IR ABLATE LIVER CRYOABLATION  09/17/2021   IR ABLATE LIVER CRYOABLATION  09/17/2021   IR ABLATE LIVER CRYOABLATION  09/17/2021   IR RADIOLOGIST EVAL & MGMT  08/04/2021   IR RADIOLOGIST EVAL & MGMT  09/11/2021   IR RADIOLOGIST EVAL & MGMT  10/13/2021   LAPAROSCOPIC GASTRIC BANDING     SUPRACERVICAL ABDOMINAL HYSTERECTOMY  2017   Allergies  Allergen Reactions   Peanut-Containing Drug Products Anaphylaxis and Hives    All nuts    Soy Allergy (Obsolete) Anaphylaxis   Percocet [Oxycodone-Acetaminophen] Hives and Swelling   Shellfish Allergy Hives and Swelling   Review of Systems  Musculoskeletal:  Positive for joint pain.     Physical Exam: Palpable pedal pulses noted.  Mild diffuse edema around both ankles.  Pain with range of motion of both  ankles.  Limited range of motion of both ankles, but without bony restriction.  Manual muscle testing 5/5 bilateral.  Epicritic sensation intact.  The anterolateral ankle joint is the area of most discomfort on palpation.  Radiographic Exam (bilateral ankle, 2 views and bilateral foot, 3 views, 10/25/2023):  Normal osseous mineralization. No fractures noted.  Decreased joint space of the bilateral ankles.  No fracture seen.  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  Decreased calcaneal inclination angle bilateral.  Assessment/Plan of Care: 1. Osteoarthritis of left ankle and foot   2. Osteoarthritis of right ankle and foot   3. Pain in joint of right ankle   4. Pain in joint involving left ankle and foot     DG FOOT COMPLETE RIGHT DG FOOT COMPLETE LEFT DG ANKLE 2 VIEWS LEFT DG ANKLE 2 VIEWS RIGHT ORTHOTICS, PODIATRY (AMBULATORY)  With the patient's verbal consent, a corticosteroid injection was adminstered to the bilateral ankle joint using an anterolateral approach.  This consisted of a mixture of 1% lidocaine  plain, 0.5% sensorcaine plain, and Kenalog-10 for a total of 1.25cc's administered to each ankle.  Bandaid applied. Patient tolerated this well.   She was instructed to keep the areas covered for 24 hours with Band-Aids.  At that time the injection site should be closed and she can remove the Band-Aids.  Will get her set  up with an orthotic consult with our pedorthist in the near future for custom molded orthotics.  Recommend Voltaren gel over-the-counter, massage to around the ankles twice daily, as needed for pain.  Otherwise follow-up as needed  Awanda CHARM Imperial, DPM, FACFAS [facility]     [address] [place], [place] [address]                Office [phone]  Fax [phone]

## 2023-10-26 NOTE — Telephone Encounter (Signed)
CLINICAL USE BELOW THIS LINE (use X to signify taken)  __X__Form received and placed in providers office for signature. ____Form completed and faxed to LOA Dept. ____Form completed & LVM to notify pt ready for pick up ____Charge sheet & copy of form in front office folder for office supervisor.   

## 2023-10-26 NOTE — Telephone Encounter (Signed)
 Called and left a voice message for patient asking to give me a call back at the office at [phone]. Also sent a MyChart message to patient as well

## 2023-10-27 NOTE — Telephone Encounter (Signed)
 Patient returned my call and informed of the need for a TB skin test. Patient is scheduled for a nurse visit for Friday 10/29/23 at 2:40 PM.

## 2023-10-29 ENCOUNTER — Telehealth: Payer: Self-pay | Admitting: Nurse Practitioner

## 2023-10-29 ENCOUNTER — Encounter: Payer: Self-pay | Admitting: Nurse Practitioner

## 2023-10-29 ENCOUNTER — Ambulatory Visit (INDEPENDENT_AMBULATORY_CARE_PROVIDER_SITE_OTHER)

## 2023-10-29 ENCOUNTER — Ambulatory Visit (INDEPENDENT_AMBULATORY_CARE_PROVIDER_SITE_OTHER): Admitting: Nurse Practitioner

## 2023-10-29 VITALS — BP 126/88 | HR 81 | Temp 97.1°F | Ht 64.0 in | Wt 283.0 lb

## 2023-10-29 DIAGNOSIS — H6690 Otitis media, unspecified, unspecified ear: Secondary | ICD-10-CM

## 2023-10-29 DIAGNOSIS — Z111 Encounter for screening for respiratory tuberculosis: Secondary | ICD-10-CM | POA: Diagnosis not present

## 2023-10-29 MED ORDER — FLUCONAZOLE 150 MG PO TABS: ORAL_TABLET | ORAL | 0 refills | Status: DC

## 2023-10-29 MED ORDER — AMOXICILLIN-POT CLAVULANATE 875-125 MG PO TABS: 1.0000 | ORAL_TABLET | Freq: Two times a day (BID) | ORAL | 0 refills | Status: DC

## 2023-10-29 NOTE — Progress Notes (Signed)
   Acute Office Visit  Subjective:     Patient ID: Veronica [name], female    DOB: 1970/08/16, 53 y.o.   MRN: [id number]  Chief Complaint  Patient presents with   Ear Pain    Right ear pain for 1 week    HPI Patient is in today for right ear pain  EAR PAIN  Duration: 1 weeks Involved ear(s): right Severity:  moderate  Quality:  aching Fever: no Otorrhea: no Upper respiratory infection symptoms: yes Pruritus: no Hearing loss: yes Water immersion no Using Q-tips: yes Recurrent otitis media: no Status: stable Treatments attempted: ear drops   ROS See pertinent positives and negatives per HPI.     Objective:    BP 126/88 (BP Location: Left Arm, Patient Position: Sitting, Cuff Size: Large)   Pulse 81   Temp (!) 97.1 F (36.2 C)   Ht 5' 4 (1.626 m)   Wt 283 lb (128.4 kg)   LMP 06/15/2010   SpO2 99%   BMI 48.58 kg/m    Physical Exam Vitals and nursing note reviewed.  Constitutional:      General: She is not in acute distress.    Appearance: Normal appearance.  HENT:     Head: Normocephalic.     Right Ear: External ear normal. Tympanic membrane is erythematous.     Left Ear: Tympanic membrane, ear canal and external ear normal.  Eyes:     Conjunctiva/sclera: Conjunctivae normal.  Cardiovascular:     Rate and Rhythm: Normal rate and regular rhythm.     Pulses: Normal pulses.     Heart sounds: Normal heart sounds.  Pulmonary:     Effort: Pulmonary effort is normal.     Breath sounds: Normal breath sounds.  Musculoskeletal:     Cervical back: Normal range of motion.  Skin:    General: Skin is warm.  Neurological:     General: No focal deficit present.     Mental Status: She is alert and oriented to person, place, and time.  Psychiatric:        Mood and Affect: Mood normal.        Behavior: Behavior normal.        Thought Content: Thought content normal.        Judgment: Judgment normal.       Assessment & Plan:   Problem List Items Addressed  This Visit   None Visit Diagnoses       Acute otitis media, unspecified otitis media type    -  Primary   Start augmentin  BID x7 days. Diflucan  150mg  x1 after. Can take ibuprofen/tylenol prn pain. Continue allergy medication   Relevant Medications   amoxicillin -clavulanate (AUGMENTIN ) 875-125 MG tablet   fluconazole  (DIFLUCAN ) 150 MG tablet       Meds ordered this encounter  Medications   amoxicillin -clavulanate (AUGMENTIN ) 875-125 MG tablet    Sig: Take 1 tablet by mouth 2 (two) times daily.    Dispense:  14 tablet    Refill:  0   fluconazole  (DIFLUCAN ) 150 MG tablet    Sig: Take 1 tablet after finishing antibiotic and a second one in 3 days if needed    Dispense:  2 tablet    Refill:  0    Return if symptoms worsen or fail to improve.  Tinnie DELENA Harada, NP

## 2023-10-29 NOTE — Patient Instructions (Signed)
 It was great to see you!  Start augmentin  twice a day for 7 days  Take diflucan  1 tablet after finishing antibiotic and a second one in 3 days if needed  You can take tylenol or ibuprofen as needed for pain  Let's follow-up if your symptoms worsen or don't improve  Take care,  Tinnie Harada, NP

## 2023-10-29 NOTE — Telephone Encounter (Unsigned)
 Copied from CRM [id number]. Topic: General - Running Late >> Oct 29, 2023  2:32 PM Gennette ORN wrote: Patient/patient representative is calling because they are running late for an appointment.

## 2023-11-01 ENCOUNTER — Ambulatory Visit

## 2023-11-01 ENCOUNTER — Ambulatory Visit: Payer: Self-pay

## 2023-11-01 DIAGNOSIS — Z0279 Encounter for issue of other medical certificate: Secondary | ICD-10-CM

## 2023-11-01 DIAGNOSIS — A159 Respiratory tuberculosis unspecified: Secondary | ICD-10-CM

## 2023-11-01 DIAGNOSIS — Z111 Encounter for screening for respiratory tuberculosis: Secondary | ICD-10-CM | POA: Diagnosis not present

## 2023-11-01 LAB — TB SKIN TEST
Induration: 0 mm
TB Skin Test: NEGATIVE

## 2023-11-01 NOTE — Progress Notes (Signed)
 Patient is in office today for a nurse visit for PPD placement reading on left left forearm with a negative result.

## 2023-11-01 NOTE — Telephone Encounter (Signed)
 CLINICAL USE BELOW THIS LINE (use X to signify taken)  ____Form received and placed in providers office for signature. __X__Form completed. __X__Form completed & given to patient at TB read nurse visit. __X__Charge sheet & copy of form in front office folder for office supervisor.

## 2023-11-04 ENCOUNTER — Ambulatory Visit (INDEPENDENT_AMBULATORY_CARE_PROVIDER_SITE_OTHER)

## 2023-11-04 DIAGNOSIS — M25572 Pain in left ankle and joints of left foot: Secondary | ICD-10-CM | POA: Diagnosis not present

## 2023-11-04 DIAGNOSIS — M25571 Pain in right ankle and joints of right foot: Secondary | ICD-10-CM

## 2023-11-04 DIAGNOSIS — M19072 Primary osteoarthritis, left ankle and foot: Secondary | ICD-10-CM | POA: Diagnosis not present

## 2023-11-04 DIAGNOSIS — M2141 Flat foot [pes planus] (acquired), right foot: Secondary | ICD-10-CM | POA: Diagnosis not present

## 2023-11-04 DIAGNOSIS — M19071 Primary osteoarthritis, right ankle and foot: Secondary | ICD-10-CM

## 2023-11-04 DIAGNOSIS — M2142 Flat foot [pes planus] (acquired), left foot: Secondary | ICD-10-CM | POA: Diagnosis not present

## 2023-11-04 NOTE — Progress Notes (Signed)
 Orthotics   Patient was present and evaluated for Custom molded foot orthotics. Patient will benefit from CFO's to provide total contact to BIL MLA's helping to balance and distribute body weight more evenly across BIL feet helping to reduce plantar pressure and pain. Orthotic will also encourage FF / RF alignment  Patient was scanned today and will return for fitting upon receipt

## 2023-12-06 ENCOUNTER — Other Ambulatory Visit

## 2023-12-14 ENCOUNTER — Other Ambulatory Visit: Payer: Self-pay | Admitting: Nurse Practitioner

## 2023-12-14 DIAGNOSIS — I10 Essential (primary) hypertension: Secondary | ICD-10-CM

## 2023-12-21 ENCOUNTER — Ambulatory Visit

## 2023-12-28 NOTE — Progress Notes (Signed)
 Patient presents today to pick up custom molded foot orthotics, diagnosed with OA BIL feet by Dr. Awanda.   Orthotics were dispensed and fit was satisfactory. Reviewed instructions for break-in and wear. Written instructions given to patient.  Patient will follow up as needed.  Lolita Schultze Cped

## 2024-01-03 ENCOUNTER — Ambulatory Visit: Admitting: Nurse Practitioner

## 2024-01-06 ENCOUNTER — Ambulatory Visit: Admitting: Nurse Practitioner

## 2024-01-25 ENCOUNTER — Ambulatory Visit: Admitting: Nurse Practitioner

## 2024-01-26 ENCOUNTER — Other Ambulatory Visit: Payer: Self-pay | Admitting: Nurse Practitioner

## 2024-02-24 ENCOUNTER — Ambulatory Visit: Admitting: Nurse Practitioner

## 2024-03-02 ENCOUNTER — Other Ambulatory Visit

## 2024-03-03 ENCOUNTER — Ambulatory Visit: Admitting: Nurse Practitioner

## 2024-03-03 ENCOUNTER — Telehealth: Payer: Self-pay

## 2024-03-03 DIAGNOSIS — E1169 Type 2 diabetes mellitus with other specified complication: Secondary | ICD-10-CM

## 2024-03-03 NOTE — Telephone Encounter (Signed)
 Patient was due to have appointment today at 10:40 AM and cancelled the appointment. Patient is wanting a refill on the Ozempic  and requesting labs

## 2024-03-03 NOTE — Telephone Encounter (Signed)
 Copied from CRM #[id number]. Topic: General - Other >> Mar 01, 2024 10:44 AM Thersia BROCKS wrote: Reason for CRM: Patient called in regarding scheduling appointment had to rescheduled and doesn't have anything sooner than 01/12 Patient ill be running out of her ozempic  soon before the appointment and wanted to know if she could be seen sooner or if NP Roselie could refill her prescription until she is seen would like for a nurse to give her a callback regarding this    Semaglutide , 2 MG/DOSE, 8 MG/3ML SOPN

## 2024-03-03 NOTE — Telephone Encounter (Signed)
 Copied from CRM [id number]. Topic: Appointments - Appointment Info/Confirmation >> Mar 03, 2024 10:34 AM Veronica [name] wrote: Patient/patient representative is calling for information regarding an appointment.   Patient would like to get lab work done so she can get her ozempic  medication. Patient thought she had to leave at 1040 and be at her appointment. I told the patient her appointment was at 10:40 and they only give a 10 minute grace period. Patient lives 20 minutes away and would not make it  [phone]

## 2024-03-05 ENCOUNTER — Other Ambulatory Visit: Payer: Self-pay | Admitting: Family

## 2024-03-06 ENCOUNTER — Other Ambulatory Visit: Payer: Self-pay | Admitting: Nurse Practitioner

## 2024-03-06 MED ORDER — SEMAGLUTIDE (2 MG/DOSE) 8 MG/3ML ~~LOC~~ SOPN: 2.0000 mg | PEN_INJECTOR | SUBCUTANEOUS | 0 refills | Status: DC

## 2024-03-06 NOTE — Telephone Encounter (Signed)
 Called patient to ask when was last does of Ozempic  medication and she stated that she has enough for this week and next week.That after next week she will be out of the medication. Patient stated that if labs need to be done she can come in next week. Patient also stated that she was due to be seen in October but had to reschedule.

## 2024-03-07 NOTE — Telephone Encounter (Signed)
 Patient called and informed of provider comments. She stated that she has a sooner appointment and is scheduled for a lab only appointment 1 week prior to office appointment. Patient verbalized understanding and all (if any) questions were answered.

## 2024-03-29 ENCOUNTER — Other Ambulatory Visit

## 2024-03-29 DIAGNOSIS — E1169 Type 2 diabetes mellitus with other specified complication: Secondary | ICD-10-CM

## 2024-03-29 DIAGNOSIS — E785 Hyperlipidemia, unspecified: Secondary | ICD-10-CM | POA: Diagnosis not present

## 2024-03-29 LAB — LIPID PANEL
Cholesterol: 143 mg/dL (ref 28–200)
HDL: 60.3 mg/dL
LDL Cholesterol: 67 mg/dL (ref 10–99)
NonHDL: 83.02
Total CHOL/HDL Ratio: 2
Triglycerides: 81 mg/dL (ref 10.0–149.0)
VLDL: 16.2 mg/dL (ref 0.0–40.0)

## 2024-03-29 LAB — RENAL FUNCTION PANEL
Albumin: 3.9 g/dL (ref 3.5–5.2)
BUN: 13 mg/dL (ref 6–23)
CO2: 33 meq/L — ABNORMAL HIGH (ref 19–32)
Calcium: 9.1 mg/dL (ref 8.4–10.5)
Chloride: 104 meq/L (ref 96–112)
Creatinine, Ser: 0.83 mg/dL (ref 0.40–1.20)
GFR: 79.07 mL/min
Glucose, Bld: 87 mg/dL (ref 70–99)
Phosphorus: 3.9 mg/dL (ref 2.3–4.6)
Potassium: 3.5 meq/L (ref 3.5–5.1)
Sodium: 141 meq/L (ref 135–145)

## 2024-03-29 LAB — HEMOGLOBIN A1C: Hgb A1c MFr Bld: 6.5 % (ref 4.6–6.5)

## 2024-03-31 ENCOUNTER — Ambulatory Visit: Payer: Self-pay | Admitting: Nurse Practitioner

## 2024-04-03 ENCOUNTER — Ambulatory Visit: Admitting: Nurse Practitioner

## 2024-04-05 ENCOUNTER — Ambulatory Visit: Admitting: Nurse Practitioner

## 2024-04-12 ENCOUNTER — Encounter: Payer: Self-pay | Admitting: Nurse Practitioner

## 2024-04-12 ENCOUNTER — Ambulatory Visit: Payer: Self-pay | Admitting: Nurse Practitioner

## 2024-04-12 VITALS — BP 132/80 | HR 90 | Temp 98.1°F | Ht 64.0 in | Wt 288.4 lb

## 2024-04-12 DIAGNOSIS — E785 Hyperlipidemia, unspecified: Secondary | ICD-10-CM

## 2024-04-12 DIAGNOSIS — I10 Essential (primary) hypertension: Secondary | ICD-10-CM

## 2024-04-12 DIAGNOSIS — E1169 Type 2 diabetes mellitus with other specified complication: Secondary | ICD-10-CM | POA: Diagnosis not present

## 2024-04-12 DIAGNOSIS — J4520 Mild intermittent asthma, uncomplicated: Secondary | ICD-10-CM

## 2024-04-12 DIAGNOSIS — Z7985 Long-term (current) use of injectable non-insulin antidiabetic drugs: Secondary | ICD-10-CM | POA: Diagnosis not present

## 2024-04-12 DIAGNOSIS — Z6841 Body Mass Index (BMI) 40.0 and over, adult: Secondary | ICD-10-CM

## 2024-04-12 LAB — MICROALBUMIN / CREATININE URINE RATIO
Creatinine,U: 161.9 mg/dL
Microalb Creat Ratio: 10.4 mg/g (ref 0.0–30.0)
Microalb, Ur: 1.7 mg/dL (ref 0.7–1.9)

## 2024-04-12 MED ORDER — LOSARTAN POTASSIUM 100 MG PO TABS: 100.0000 mg | ORAL_TABLET | Freq: Every day | ORAL | 3 refills | Status: AC

## 2024-04-12 MED ORDER — MONTELUKAST SODIUM 10 MG PO TABS: 10.0000 mg | ORAL_TABLET | Freq: Every day | ORAL | 3 refills | Status: AC

## 2024-04-12 MED ORDER — SEMAGLUTIDE (2 MG/DOSE) 8 MG/3ML ~~LOC~~ SOPN: 2.0000 mg | PEN_INJECTOR | SUBCUTANEOUS | 1 refills | Status: AC

## 2024-04-12 NOTE — Assessment & Plan Note (Signed)
 Stable with albuterol  prn, and montelukast  daily Med refill sent

## 2024-04-12 NOTE — Patient Instructions (Signed)
 Go to lab Maintain Heart healthy diet and daily exercise. Maintain current medications.

## 2024-04-12 NOTE — Assessment & Plan Note (Signed)
 LDL at goal per result on 98/7973 Maintain atorvastatin  dose, refill sent

## 2024-04-12 NOTE — Assessment & Plan Note (Addendum)
 UTD with Dm eye exam: report requested from Dr. Elner. Repeat hgbA1c at 6.5% controlled with  ozempic  2mg  weekly LDL at goal with statin BP at goal with losartan  repeat UACr today No neuropathy. Foot exam completed today  Maintain med dose, refill sent Encouraged to maintain heart healthy diet and daily exercise F/up in 6months

## 2024-04-12 NOTE — Progress Notes (Unsigned)
 "               Established Patient Visit  Patient: Veronica [name]   DOB: 02-17-1971   54 y.o. Female  MRN: [id number] Visit Date: 04/12/2024  Subjective:    Chief Complaint  Patient presents with   Follow-up    3 month follow up for HTN, DM and weight management    HPI DM (diabetes mellitus) (HCC) UTD with Dm eye exam: report requested from Dr. Elner. Repeat hgbA1c at 6.5% controlled with  ozempic  2mg  weekly LDL at goal with statin BP at goal with losartan  repeat UACr today No neuropathy. Foot exam completed today  Maintain med dose, refill sent Encouraged to maintain heart healthy diet and daily exercise F/up in 6months   HTN (hypertension), benign BP at goal stable renal function on 03/2024 BP Readings from Last 3 Encounters:  04/12/24 132/80  10/29/23 126/88  10/01/23 132/70    Maintain losartan  dose. Refill sent F/up in 6months  Asthma Stable with albuterol  prn, and montelukast  daily Med refill sent  Hyperlipidemia associated with type 2 diabetes mellitus (HCC) LDL at goal per result on 98/7973 Maintain atorvastatin  dose, refill sent  Wt Readings from Last 3 Encounters:  04/12/24 288 lb 6.4 oz (130.8 kg)  10/29/23 283 lb (128.4 kg)  10/01/23 281 lb 3.2 oz (127.6 kg)    BP Readings from Last 3 Encounters:  04/12/24 132/80  10/29/23 126/88  10/01/23 132/70    Reviewed medical, surgical, and social history today  Medications: Show/hide medication list[1] Reviewed past medical and social history.   ROS per HPI above  Last metabolic panel Lab Results  Component Value Date   GLUCOSE 87 03/29/2024   NA 141 03/29/2024   K 3.5 03/29/2024   CL 104 03/29/2024   CO2 33 (H) 03/29/2024   BUN 13 03/29/2024   CREATININE 0.83 03/29/2024   GFR 79.07 03/29/2024   CALCIUM  9.1 03/29/2024   PHOS 3.9 03/29/2024   PROT 7.7 10/01/2023   ALBUMIN 3.9 03/29/2024   BILITOT 0.4 10/01/2023   ALKPHOS 94 10/01/2023   AST 13 10/01/2023   ALT 17 10/01/2023   Last  lipids Lab Results  Component Value Date   CHOL 143 03/29/2024   HDL 60.30 03/29/2024   LDLCALC 67 03/29/2024   LDLDIRECT 69.0 01/25/2023   TRIG 81.0 03/29/2024   CHOLHDL 2 03/29/2024   Last hemoglobin A1c Lab Results  Component Value Date   HGBA1C 6.5 03/29/2024        Objective:  BP 132/80 (BP Location: Left Arm, Patient Position: Sitting, Cuff Size: Large)   Pulse 90   Temp 98.1 F (36.7 C) (Oral)   Ht 5' 4 (1.626 m)   Wt 288 lb 6.4 oz (130.8 kg)   LMP 06/15/2010   SpO2 97%   BMI 49.50 kg/m      Physical Exam Vitals and nursing note reviewed.  Constitutional:      Appearance: She is obese.  Cardiovascular:     Rate and Rhythm: Normal rate and regular rhythm.     Pulses: Normal pulses.     Heart sounds: Normal heart sounds.  Pulmonary:     Effort: Pulmonary effort is normal.     Breath sounds: Normal breath sounds.  Neurological:     Mental Status: She is alert and oriented to person, place, and time.     No results found for any visits on 04/12/24.    Assessment & Plan:    Problem List  Items Addressed This Visit     Asthma   Stable with albuterol  prn, and montelukast  daily Med refill sent      Relevant Medications   montelukast  (SINGULAIR ) 10 MG tablet   DM (diabetes mellitus) (HCC)   UTD with Dm eye exam: report requested from Dr. Elner. Repeat hgbA1c at 6.5% controlled with  ozempic  2mg  weekly LDL at goal with statin BP at goal with losartan  repeat UACr today No neuropathy. Foot exam completed today  Maintain med dose, refill sent Encouraged to maintain heart healthy diet and daily exercise F/up in 6months       Relevant Medications   losartan  (COZAAR ) 100 MG tablet   Semaglutide , 2 MG/DOSE, 8 MG/3ML SOPN   Other Relevant Orders   Microalbumin / creatinine urine ratio   HTN (hypertension), benign - Primary   BP at goal stable renal function on 03/2024 BP Readings from Last 3 Encounters:  04/12/24 132/80  10/29/23 126/88   10/01/23 132/70    Maintain losartan  dose. Refill sent F/up in 6months      Relevant Medications   losartan  (COZAAR ) 100 MG tablet   Other Relevant Orders   Microalbumin / creatinine urine ratio   Hyperlipidemia associated with type 2 diabetes mellitus (HCC)   LDL at goal per result on 98/7973 Maintain atorvastatin  dose, refill sent      Relevant Medications   losartan  (COZAAR ) 100 MG tablet   Semaglutide , 2 MG/DOSE, 8 MG/3ML SOPN   Morbid obesity (HCC)   Relevant Medications   Semaglutide , 2 MG/DOSE, 8 MG/3ML SOPN   Other Visit Diagnoses       Primary hypertension       Relevant Medications   losartan  (COZAAR ) 100 MG tablet      Return in about 6 months (around 10/10/2024) for HTN, DM, hyperlipidemia (fasting).     Roselie Mood, NP      [1]  Outpatient Medications Prior to Visit  Medication Sig   albuterol  (VENTOLIN  HFA) 108 (90 Base) MCG/ACT inhaler INHALE 1-2 PUFFS BY MOUTH EVERY 6 HOURS AS NEEDED FOR WHEEZE OR SHORTNESS OF BREATH   Ascorbic Acid (VITAMIN C) 1000 MG tablet Take 1,000 mg by mouth daily.   atorvastatin  (LIPITOR) 20 MG tablet Take 1 tablet (20 mg total) by mouth daily.   CELEBREX 200 MG capsule Take 200 mg by mouth daily as needed.   cyanocobalamin (VITAMIN B12) 1000 MCG tablet Take 1,000 mcg by mouth daily.   DULoxetine  (CYMBALTA ) 20 MG capsule Take 1 capsule (20 mg total) by mouth daily.   fluticasone  (FLONASE ) 50 MCG/ACT nasal spray Place 2 sprays into both nostrils daily. (Patient taking differently: Place 2 sprays into both nostrils as needed for allergies or rhinitis.)   tiZANidine (ZANAFLEX) 2 MG tablet Take 2 mg by mouth 3 (three) times daily as needed.   [DISCONTINUED] losartan  (COZAAR ) 100 MG tablet TAKE 1 TABLET BY MOUTH EVERY DAY   [DISCONTINUED] montelukast  (SINGULAIR ) 10 MG tablet TAKE 1 TABLET BY MOUTH EVERYDAY AT BEDTIME   [DISCONTINUED] Semaglutide , 2 MG/DOSE, 8 MG/3ML SOPN Inject 2 mg as directed once a week. No additional  refill without appointment with pcp   No facility-administered medications prior to visit.   "

## 2024-04-12 NOTE — Assessment & Plan Note (Addendum)
 BP at goal stable renal function on 03/2024 BP Readings from Last 3 Encounters:  04/12/24 132/80  10/29/23 126/88  10/01/23 132/70    Maintain losartan  dose. Refill sent F/up in 6months

## 2024-04-13 ENCOUNTER — Ambulatory Visit: Payer: Self-pay | Admitting: Nurse Practitioner

## 2024-05-16 ENCOUNTER — Ambulatory Visit: Admitting: Nurse Practitioner

## 2024-10-10 ENCOUNTER — Ambulatory Visit: Admitting: Nurse Practitioner
